# Patient Record
Sex: Male | Born: 1960 | Race: White | Hispanic: No | Marital: Married | Smoking: Never smoker
Health system: Southern US, Community
[De-identification: ages and names within clinical notes are randomized; demographics above are authoritative.]

## PROBLEM LIST (undated history)

## (undated) DIAGNOSIS — F32A Depression, unspecified: Secondary | ICD-10-CM

## (undated) DIAGNOSIS — F419 Anxiety disorder, unspecified: Secondary | ICD-10-CM

## (undated) DIAGNOSIS — E119 Type 2 diabetes mellitus without complications: Secondary | ICD-10-CM

## (undated) DIAGNOSIS — I10 Essential (primary) hypertension: Secondary | ICD-10-CM

## (undated) DIAGNOSIS — F259 Schizoaffective disorder, unspecified: Secondary | ICD-10-CM

## (undated) DIAGNOSIS — M1990 Unspecified osteoarthritis, unspecified site: Secondary | ICD-10-CM

## (undated) DIAGNOSIS — I639 Cerebral infarction, unspecified: Secondary | ICD-10-CM

## (undated) DIAGNOSIS — F329 Major depressive disorder, single episode, unspecified: Secondary | ICD-10-CM

## (undated) DIAGNOSIS — Z8669 Personal history of other diseases of the nervous system and sense organs: Secondary | ICD-10-CM

## (undated) DIAGNOSIS — E7800 Pure hypercholesterolemia, unspecified: Secondary | ICD-10-CM

## (undated) DIAGNOSIS — K621 Rectal polyp: Secondary | ICD-10-CM

## (undated) HISTORY — PX: VASECTOMY: SHX75

## (undated) HISTORY — DX: Depression, unspecified: F32.A

## (undated) HISTORY — DX: Cerebral infarction, unspecified: I63.9

## (undated) HISTORY — DX: Pure hypercholesterolemia, unspecified: E78.00

## (undated) HISTORY — DX: Unspecified osteoarthritis, unspecified site: M19.90

## (undated) HISTORY — DX: Essential (primary) hypertension: I10

## (undated) HISTORY — DX: Major depressive disorder, single episode, unspecified: F32.9

## (undated) HISTORY — DX: Schizoaffective disorder, unspecified: F25.9

## (undated) HISTORY — DX: Rectal polyp: K62.1

## (undated) HISTORY — DX: Personal history of other diseases of the nervous system and sense organs: Z86.69

## (undated) HISTORY — DX: Type 2 diabetes mellitus without complications: E11.9

## (undated) HISTORY — DX: Anxiety disorder, unspecified: F41.9

## (undated) HISTORY — PX: OTHER SURGICAL HISTORY: SHX169

## (undated) HISTORY — PX: TONSILLECTOMY: SHX5217

---

## 2004-12-05 ENCOUNTER — Ambulatory Visit (HOSPITAL_COMMUNITY): Admission: RE | Admit: 2004-12-05 | Discharge: 2004-12-05 | Payer: Self-pay | Admitting: Gastroenterology

## 2007-01-12 ENCOUNTER — Emergency Department (HOSPITAL_COMMUNITY): Admission: EM | Admit: 2007-01-12 | Discharge: 2007-01-12 | Payer: Self-pay | Admitting: Emergency Medicine

## 2009-10-22 ENCOUNTER — Ambulatory Visit: Payer: Self-pay | Admitting: Internal Medicine

## 2009-10-22 DIAGNOSIS — R29898 Other symptoms and signs involving the musculoskeletal system: Secondary | ICD-10-CM | POA: Insufficient documentation

## 2009-10-22 DIAGNOSIS — F329 Major depressive disorder, single episode, unspecified: Secondary | ICD-10-CM

## 2009-10-22 DIAGNOSIS — M25559 Pain in unspecified hip: Secondary | ICD-10-CM | POA: Insufficient documentation

## 2009-10-22 DIAGNOSIS — M79671 Pain in right foot: Secondary | ICD-10-CM

## 2009-10-22 DIAGNOSIS — J309 Allergic rhinitis, unspecified: Secondary | ICD-10-CM | POA: Insufficient documentation

## 2009-10-22 DIAGNOSIS — G43909 Migraine, unspecified, not intractable, without status migrainosus: Secondary | ICD-10-CM | POA: Insufficient documentation

## 2009-10-22 DIAGNOSIS — I10 Essential (primary) hypertension: Secondary | ICD-10-CM

## 2009-10-23 LAB — CONVERTED CEMR LAB
AST: 39 units/L — ABNORMAL HIGH (ref 0–37)
Albumin: 4.5 g/dL (ref 3.5–5.2)
Alkaline Phosphatase: 56 units/L (ref 39–117)
BUN: 16 mg/dL (ref 6–23)
Creatinine, Ser: 1 mg/dL (ref 0.4–1.5)
Eosinophils Relative: 7.5 % — ABNORMAL HIGH (ref 0.0–5.0)
Folate: 14.4 ng/mL
HCT: 47.1 % (ref 39.0–52.0)
Hemoglobin: 16.8 g/dL (ref 13.0–17.0)
Lymphocytes Relative: 29.2 % (ref 12.0–46.0)
Lymphs Abs: 1.9 10*3/uL (ref 0.7–4.0)
Platelets: 186 10*3/uL (ref 150.0–400.0)
RBC: 5.04 M/uL (ref 4.22–5.81)
TSH: 2.42 microintl units/mL (ref 0.35–5.50)
Vitamin B-12: 550 pg/mL (ref 211–911)

## 2009-10-24 ENCOUNTER — Encounter: Payer: Self-pay | Admitting: Internal Medicine

## 2009-12-03 ENCOUNTER — Ambulatory Visit: Payer: Self-pay | Admitting: Internal Medicine

## 2011-01-07 ENCOUNTER — Telehealth: Payer: Self-pay | Admitting: Internal Medicine

## 2011-01-22 NOTE — Progress Notes (Signed)
Summary: Lab req  Phone Note Call from Patient Call back at Home Phone [phone]   Caller: Spouse [phone] Summary of Call: Pt's spouse called requesting to have testosterone added to CPX labs, okay to add? Dx code V48.49 okay? Initial call taken by: Margaret Pyle, CMA,  January 07, 2011 9:51 AM  Follow-up for Phone Call        ok Follow-up by: Newt Lukes MD,  January 07, 2011 12:12 PM  Additional Follow-up for Phone Call Additional follow up Details #1::        Can you please add test to pt's CPX labs? Thx Additional Follow-up by: Margaret Pyle, CMA,  January 07, 2011 2:04 PM    Additional Follow-up for Phone Call Additional follow up Details #2::    THIS IS ADDED.  IS THE PATIENT AWARE? Follow-up by: Hilarie Fredrickson,  January 08, 2011 8:40 AM  Additional Follow-up for Phone Call Additional follow up Details #3:: Details for Additional Follow-up Action Taken: LMOM (CELL) THAT LAB WAS ADDED. Additional Follow-up by: Hilarie Fredrickson,  January 08, 2011 3:28 PM

## 2011-02-02 ENCOUNTER — Other Ambulatory Visit: Payer: Self-pay

## 2011-02-04 ENCOUNTER — Ambulatory Visit (INDEPENDENT_AMBULATORY_CARE_PROVIDER_SITE_OTHER): Payer: 59 | Admitting: Family Medicine

## 2011-02-04 DIAGNOSIS — F3289 Other specified depressive episodes: Secondary | ICD-10-CM

## 2011-02-04 DIAGNOSIS — E7800 Pure hypercholesterolemia, unspecified: Secondary | ICD-10-CM

## 2011-02-04 DIAGNOSIS — R5381 Other malaise: Secondary | ICD-10-CM

## 2011-02-04 DIAGNOSIS — Z0000 Encounter for general adult medical examination without abnormal findings: Secondary | ICD-10-CM

## 2011-02-04 DIAGNOSIS — F329 Major depressive disorder, single episode, unspecified: Secondary | ICD-10-CM

## 2011-02-09 ENCOUNTER — Encounter: Payer: Self-pay | Admitting: Internal Medicine

## 2011-02-09 ENCOUNTER — Ambulatory Visit: Payer: 59

## 2011-02-10 ENCOUNTER — Ambulatory Visit (INDEPENDENT_AMBULATORY_CARE_PROVIDER_SITE_OTHER): Payer: 59

## 2011-02-10 DIAGNOSIS — Z79899 Other long term (current) drug therapy: Secondary | ICD-10-CM

## 2011-02-11 ENCOUNTER — Ambulatory Visit (INDEPENDENT_AMBULATORY_CARE_PROVIDER_SITE_OTHER): Payer: 59 | Admitting: Family Medicine

## 2011-02-11 ENCOUNTER — Ambulatory Visit: Payer: 59

## 2011-02-11 DIAGNOSIS — R945 Abnormal results of liver function studies: Secondary | ICD-10-CM

## 2011-02-11 DIAGNOSIS — R7301 Impaired fasting glucose: Secondary | ICD-10-CM

## 2011-02-11 DIAGNOSIS — K5289 Other specified noninfective gastroenteritis and colitis: Secondary | ICD-10-CM

## 2011-03-05 ENCOUNTER — Ambulatory Visit: Payer: 59 | Admitting: Family Medicine

## 2011-03-06 ENCOUNTER — Ambulatory Visit (INDEPENDENT_AMBULATORY_CARE_PROVIDER_SITE_OTHER): Payer: 59 | Admitting: Family Medicine

## 2011-03-06 DIAGNOSIS — K5900 Constipation, unspecified: Secondary | ICD-10-CM

## 2011-03-06 DIAGNOSIS — R748 Abnormal levels of other serum enzymes: Secondary | ICD-10-CM

## 2011-03-06 DIAGNOSIS — E7800 Pure hypercholesterolemia, unspecified: Secondary | ICD-10-CM

## 2011-03-06 DIAGNOSIS — F329 Major depressive disorder, single episode, unspecified: Secondary | ICD-10-CM

## 2011-05-08 NOTE — Op Note (Signed)
NAMECONALL, VANGORDER              ACCOUNT NO.:  [id number]   MEDICAL RECORD NO.:  [id number]          PATIENT TYPE:  AMB   LOCATION:  ENDO                         FACILITY:  [facility]   PHYSICIAN:  Graylin Shiver, M.D.   DATE OF BIRTH:  09-Jul-1961   DATE OF PROCEDURE:  12/05/2004  DATE OF DISCHARGE:                                 OPERATIVE REPORT   PROCEDURE:  Colonoscopy.   INDICATIONS:  Rectal bleeding.   Informed consent was obtained after explanation of the risks of bleeding,  infection, and perforation.   PREMEDICATIONS:  Fentanyl 75 mcg IV, Versed 8 mg IV.   PROCEDURE:  With the patient in the left lateral decubitus position, a  rectal exam was performed.  No masses were felt.  The Olympus colonoscope  was inserted into the rectum and advanced around the colon to the cecum.  Cecal landmarks were identified.  The cecum and ascending colon were normal.  The transverse colon normal.  The descending colon, sigmoid, and rectum were  normal.  There were some small internal and external hemorrhoids noted.  He  tolerated the procedure well without complications.   IMPRESSION:  Normal colonoscopy to the cecum with some small internal and  external hemorrhoids.      SFG/MEDQ  D:  12/05/2004  T:  12/05/2004  Job:  [id number]   cc:   Annabell Howells, MD  Fax: [phone]

## 2011-06-05 ENCOUNTER — Other Ambulatory Visit: Payer: Self-pay | Admitting: Family Medicine

## 2011-06-08 ENCOUNTER — Telehealth: Payer: Self-pay | Admitting: *Deleted

## 2011-06-08 ENCOUNTER — Telehealth: Payer: Self-pay

## 2011-06-08 MED ORDER — BUPROPION HCL ER (XL) 150 MG PO TB24: 300.0000 mg | ORAL_TABLET | Freq: Every day | ORAL | Status: DC

## 2011-06-08 NOTE — Telephone Encounter (Signed)
I had already denied the med refill request, since the dose was incorrect. There likely shouldn't be any difference, but if he prefers to go back to the Wellbutrin XL 150mg  taking 2 tabs daily, then okay to refill #60 with 2 additional refills.  If he still feels that it isn't working adequately, then schedule OV.  (Phone encounter was already closed)

## 2011-06-08 NOTE — Telephone Encounter (Signed)
Spoke with Lawson Fiscal, pt's wife and notified her that Dr.Knapp said it was ok to change rx to bupropion hcl xl 150mg  #60 2qd with 2rf's to [place] [phone]) and if they find that taking the 2 pills instead of the one for 300mg  is no longer working to schedule an OV.

## 2011-06-08 NOTE — Telephone Encounter (Signed)
This was in as an rx request, not sure if you got it too, so I am sending it to you. Thx.

## 2011-06-08 NOTE — Telephone Encounter (Signed)
Pt wife called said husband feels like he dosent get the same affect from the 300 mg tab of Wellbutrin as he got from the 150 mg that he took 2 tabs at same time ask what could they do [employer] [phone] said she thinks its psychological but not sure

## 2011-06-08 NOTE — Telephone Encounter (Signed)
Forwarding this message to you, I believe there was an rx request for this medication on this patient this am. Thx.

## 2011-09-07 ENCOUNTER — Other Ambulatory Visit: Payer: 59

## 2011-09-07 ENCOUNTER — Encounter: Payer: Self-pay | Admitting: Family Medicine

## 2011-09-07 DIAGNOSIS — R7309 Other abnormal glucose: Secondary | ICD-10-CM

## 2011-09-07 DIAGNOSIS — E7800 Pure hypercholesterolemia, unspecified: Secondary | ICD-10-CM

## 2011-09-07 LAB — HEPATIC FUNCTION PANEL
ALT: 30 U/L (ref 0–53)
AST: 26 U/L (ref 0–37)
Albumin: 4.7 g/dL (ref 3.5–5.2)
Bilirubin, Direct: 0.1 mg/dL (ref 0.0–0.3)
Indirect Bilirubin: 0.4 mg/dL (ref 0.0–0.9)
Total Bilirubin: 0.5 mg/dL (ref 0.3–1.2)
Total Protein: 7.2 g/dL (ref 6.0–8.3)

## 2011-09-07 LAB — LIPID PANEL
Cholesterol: 204 mg/dL — ABNORMAL HIGH (ref 0–200)
HDL: 47 mg/dL (ref >39)
LDL Cholesterol: 133 mg/dL — ABNORMAL HIGH (ref 0–99)
Total CHOL/HDL Ratio: 4.3 Ratio
Triglycerides: 119 mg/dL (ref <150)

## 2011-09-07 LAB — HEMOGLOBIN A1C: Mean Plasma Glucose: 120 mg/dL — ABNORMAL HIGH (ref <117)

## 2011-09-10 ENCOUNTER — Ambulatory Visit (INDEPENDENT_AMBULATORY_CARE_PROVIDER_SITE_OTHER): Payer: 59 | Admitting: Family Medicine

## 2011-09-10 ENCOUNTER — Encounter: Payer: Self-pay | Admitting: Family Medicine

## 2011-09-10 VITALS — BP 150/100 | HR 72 | Ht 67.0 in | Wt 191.0 lb

## 2011-09-10 DIAGNOSIS — R030 Elevated blood-pressure reading, without diagnosis of hypertension: Secondary | ICD-10-CM

## 2011-09-10 DIAGNOSIS — F329 Major depressive disorder, single episode, unspecified: Secondary | ICD-10-CM

## 2011-09-10 DIAGNOSIS — E7800 Pure hypercholesterolemia, unspecified: Secondary | ICD-10-CM

## 2011-09-10 MED ORDER — BUPROPION HCL ER (XL) 300 MG PO TB24: 300.0000 mg | ORAL_TABLET | Freq: Every day | ORAL | Status: DC

## 2011-09-10 NOTE — Progress Notes (Signed)
Patient presents for 6 month follow-up.  He had fasting labs earlier this week.  Depression:  Moods are up and down, doesn't seem to be doing quite as well as he was when medications were originally started.  He thought the 300mg  tablet didn't work as well as taking 2 150mg  tablets.  That seemed to work better for about 2 months, but now is back to not working as well.  Job stress seems to contribute to moods. Working a lot of hours, hours recently changed.  Recent "blunder" with his boss; he feels it was related to insecurity related to issues growing up. Never ended up going for counseling (had to get rescheduled, and never went). Ongoing memory issues, possibly contributed to lack of sleep (at work by 4:30 am; going to bed by 9am, and having restless sleep).  Takes 2 Benadryl at night to help with sleep--helps some, but dries him out too much.  Recalls trying Melatonin, but doesn't remember how helpful it was.   Hasn't checked BP elsewhere since last visit.  Having headaches about every 3-4 days, described as migraines (but not full blown)--last one was when he had his fasting labs, didn't have his caffeine, and had a headache.  Gets a weird kind of a pain in his chest, described as a "stutter in his heart", heart felt like it was flip-flopping.  Also gets various shooting pains in chest, in different places every times, and sometimes in arms.  Not related to exercise, strenuous activity.  Hyperlipidemia--just had 6 month re-check, after a dietary trial.  Lab results from last week:  A1c 5.8; LFTs were normal (h/o elevated liver tests 6 months ago) Total chol 204; Triglycerides 119; HDL 47; Total CHOL/HDL Ratio 4.3; LDL Cholesterol 133   Past Medical History  Diagnosis Date  . Depression   . Elevated cholesterol   . History of migraine headaches     Past Surgical History  Procedure Date  . Vasectomy   . Tonsilectomy, adenoidectomy, bilateral myringotomy and tubes     History   Social  History  . Marital Status: Married    Spouse Name: N/A    Number of Children: N/A  . Years of Education: N/A   Occupational History  . Designer, industrial/product    Social History Main Topics  . Smoking status: Never Smoker   . Smokeless tobacco: Not on file  . Alcohol Use: Yes     1 beer or wine 2-3 times per month.  . Drug Use: No  . Sexually Active: Not on file   Other Topics Concern  . Not on file   Social History Narrative  . No narrative on file    Family History  Problem Relation Age of Onset  . Cancer Paternal Aunt   . Hypertension Maternal Grandfather   . COPD Maternal Grandfather   . Stroke Maternal Grandfather     Current outpatient prescriptions:buPROPion (WELLBUTRIN XL) 300 MG 24 hr tablet, Take 1 tablet (300 mg total) by mouth daily., Disp: 30 tablet, Rfl: 2;  cholecalciferol (VITAMIN D) 1000 UNITS tablet, Take 1,000 Units by mouth daily.  , Disp: , Rfl: ;  diphenhydrAMINE (BENADRYL) 25 mg capsule, Take 25 mg by mouth every 6 (six) hours as needed.  , Disp: , Rfl: ;  Lutein 10 MG TABS, Take 1 tablet by mouth daily.  , Disp: , Rfl:  Omega-3 Fatty Acids (FISH OIL) 1200 MG CAPS, Take 1 capsule by mouth daily.  , Disp: , Rfl:  No Known Allergies  ROS: Denies fevers, cough, SOB.  Some ongoing congestion, slight PND/sore throat.  Denies GI complaints. +insomnia, depression, allergies. See HPI  PHYSICAL EXAM: BP 150/100  Pulse 72  Ht 5\' 7"  (1.702 m)  Wt 191 lb (86.637 kg)  BMI 29.91 kg/m2 Pleasant male, no distress HEENT: PERRL, conjunctiva clear, OP normal Neck: no lymphadenopathy or thyromegaly Heart: regular rate and rhythm without murmur Lungs: clear bilaterally Abdomen: soft, nontender, no organomegaly or mass Extremities: no clubbing, cyanosis or edema Psych: Became tearful in discussing his moods, needed to take a few moments to compose himself.  Full range of affect.  Normal speech, fair eye contact Skin: no rash Neuro: alert and oriented x 3; normal gait,  cranial nerves grossly intact  ASSESSMENT/PLAN: 1. Depressive disorder, not elsewhere classified  buPROPion (WELLBUTRIN XL) 300 MG 24 hr tablet  2. Elevated BP    3. Pure hypercholesterolemia     Depression--Counseling strongly encouraged.  Given Raynelle Fanning Whitt's card.  Discussed regular exercise and stress reduction techniques.  Will continue current meds for now (pt declines starting new med)--consider changing at f/u (discussed increasing wellbutrin to 450 mg vs adding SSRI) Insomnia--Recommended re-tryingMelatonin; daily exercise will help with sleep. Stop benadryl at bedtime--restart at just 1 tablet if melatonin ineffective alone.  Decreasing benadryl may help with the dryness High Cholesterol--has improved with dietary measures--still borderline (LDL goal <130, ratio goal <4)  Cut back on cheese intake; daily exercise and continue fish oil.  Recheck 6 months Elevated BP--low sodium diet reviewed; daily exercise, weight loss.  He has a BP monitor--to start checking at home.  Bring BP list and monitor to f/u in 1 month

## 2011-09-10 NOTE — Patient Instructions (Addendum)
Daily exercise of at least 30 minutes is recommended. Low sodium diet--see below. Call for counseling Curtis Lam or whomever takes your insurance) Check blood pressure a few times/week (every day is okay if you prefer) Bring your list of blood pressures and your blood pressure monitor to your visit next month  2 Gram Low Sodium Diet A 2 gram sodium diet restricts the amount of sodium in the diet to no more than 2 grams (g) or 2000 milligrams (mg) daily. Limiting the amount of sodium is often used to help lower blood pressure. It is important if you have heart, liver, or kidney problems. Many foods contain sodium for flavor and sometimes as a preservative. When the amount of sodium in a diet needs to be low, it is important to know what to look for when choosing foods and drinks. The following includes some information and guidelines to help make it easier for you to adapt to a low sodium diet. QUICK TIPS  Do not add salt to food.   Avoid convenience items and fast food.   Choose unsalted snack foods.   Buy lower sodium products, often labeled as "lower sodium" or "no salt added."   Check food labels to learn how much sodium is in 1 serving.   When eating at a restaurant, ask that your food be prepared with less salt or none, if possible.  READING FOOD LABELS FOR SODIUM INFORMATION The nutrition facts label is a good place to find how much sodium is in foods. Look for products with no more than 500 to 600 mg of sodium per meal and no more than 150 mg per serving. Remember that 2 g = 2000 mg. The food label may also list foods as:  Sodium-free: Less than 5 mg in a serving.   Very low sodium: 35 mg or less in a serving.   Low-sodium: 140 mg or less in a serving.   Light in sodium: 50% less sodium in a serving. For example, if a food that usually has 300 mg of sodium is changed to become light in sodium, it will have 150 mg of sodium.   Reduced sodium: 25% less sodium in a serving.  For example, if a food that usually has 400 mg of sodium is changed to reduced sodium, it will have 300 mg of sodium.  CHOOSING FOODS AVOID CHOOSE  Grains: Salted crackers and snack items. Some cereals, including instant hot cereals. Bread stuffing and biscuit mixes. Seasoned rice or pasta mixes. Grains: Unsalted snack items. Low-sodium cereals, oats, puffed wheat and rice, shredded wheat. English muffins and bread. Pasta.  Meats: Salted, canned, smoked, spiced, or pickled meats, including fish and poultry. Bacon, ham, sausage, cold cuts, hot dogs, anchovies. Meats: Low-sodium canned tuna and salmon. Fresh or frozen meat, poultry, and fish.  Dairy: Processed cheese and spreads. Cottage cheese. Buttermilk and condensed milk. Regular cheese. Dairy: Milk. Low-sodium cottage cheese. Yogurt. Sour cream. Low-sodium cheese.  Fruits and Vegetables: Regular canned vegetables. Regular canned tomato sauce and paste. Frozen vegetables in sauces. Olives. Rosita Fire. Relishes. Sauerkraut. Fruits and Vegetables: Low-sodium canned vegetables. Low-sodium tomato sauce and paste. Fresh and frozen vegetables. Fresh and frozen fruit.  Condiments: Canned and packaged gravies. Worcestershire sauce. Tartar sauce. Barbecue sauce. Soy sauce. Steak sauce. Ketchup. Onion, garlic, and table salt. Meat flavorings and tenderizers. Condiments: Fresh and dried herbs and spices. Low-sodium varieties of mustard and ketchup. Lemon juice. Tabasco sauce. Horseradish.  SAMPLE 2 GRAM SODIUM MEAL PLAN Meal Foods Sodium (mg)  Breakfast 1 cup low-fat milk 2 slices whole-wheat toast 1 tablespoon heart-healthy margarine 1 hard-boiled egg 1 small orange [phone]  Lunch 1 cup raw carrots  cup hummus 1 cup low-fat milk  cup red grapes 1 whole-wheat pita bread [phone]  Dinner 1 cup whole-wheat pasta 1 cup low-sodium tomato sauce 3 ounces lean ground beef 1 small side  salad (1 cup raw spinach leaves,  cup cucumber,  cup yellow bell pepper) with 1 teaspoon olive oil and 1 teaspoon red wine vinegar 2 73 [phone] 25  Snack 1 container low-fat vanilla yogurt 3 graham cracker squares 107 127  Nutrient Analysis Calories: 2033 Protein: 77 g Carbohydrate: 282 g Fat: 72 g Sodium: 1971 mg Document Released: 12/07/2005 Document Re-Released: 05/27/2010 [facility] Patient Information 2011 [place], [place].

## 2011-09-17 ENCOUNTER — Telehealth: Payer: Self-pay | Admitting: Family Medicine

## 2011-10-07 NOTE — Telephone Encounter (Signed)
dt ?

## 2011-10-12 ENCOUNTER — Ambulatory Visit (INDEPENDENT_AMBULATORY_CARE_PROVIDER_SITE_OTHER): Payer: 59 | Admitting: Family Medicine

## 2011-10-12 ENCOUNTER — Encounter: Payer: Self-pay | Admitting: Family Medicine

## 2011-10-12 VITALS — BP 142/96 | HR 76 | Ht 67.0 in | Wt 188.0 lb

## 2011-10-12 DIAGNOSIS — R143 Flatulence: Secondary | ICD-10-CM

## 2011-10-12 DIAGNOSIS — R030 Elevated blood-pressure reading, without diagnosis of hypertension: Secondary | ICD-10-CM

## 2011-10-12 DIAGNOSIS — R140 Abdominal distension (gaseous): Secondary | ICD-10-CM

## 2011-10-12 DIAGNOSIS — F329 Major depressive disorder, single episode, unspecified: Secondary | ICD-10-CM

## 2011-10-12 NOTE — Patient Instructions (Addendum)
Start exercising, weight loss recommended.  Continue low sodium diet Consider checking testosterone levels if fatigue persists even after exercising Gas--trial of lactose-free diet.  Consider trial of Beano before vegetables that produce gas. Call Berniece Andreas (or other counselor if she doesn't take your insurance) to start counseling.  Continue to monitor blood pressures.  Lactose-Free Diet Lactose is a carbohydrate that is found mainly in milk and milk products, as well as in foods with added milk or whey. Lactose must be digested by the enzyme lactase in order to be used by the body. Lactose intolerance occurs when there is a shortage of lactase. When your body is not able to digest lactose, you may feel sick to your stomach (nausea), bloated, and have cramps, gas, and diarrhea. TYPES OF LACTASE DEFICIENCY  Primary lactase deficiency. This is the most common type. It is characterized by a slow decrease in lactase activity.   Secondary lactase deficiency. This occurs following injury to the small intestinal mucosa as a result of a disease or condition. It can also occur as a result of surgery or after treatment with antibiotic medicines or cancer drugs.  Tolerance to lactose varies widely. Each person must determine how much milk can be consumed without developing symptoms. Drinking smaller portions of milk throughout the day may be helpful. Some studies suggest that slowing gastric emptying may help increase tolerance of milk products. This may be done by:  Consuming milk or milk products with a meal rather than alone.   Consuming milk with a higher fat content.  There are many dairy products that may be tolerated better than milk by some people, including:  Cheese (especially aged cheese). The lactose content is much lower than in milk.   Cultured dairy products, such as yogurt, buttermilk, cottage cheese, and sweet acidophilus milk (kefir). These products are usually well tolerated by  lactase-deficient people. This is because the healthy bacteria help digest lactose.   Lactose-hydrolyzed milk. This product contains 40% to 90% less lactose than milk and may also be well tolerated.  ADEQUACY These diets may be deficient in calcium, riboflavin, and vitamin D, according to the Recommended Dietary Allowances of the [employer]. Depending on individual tolerances and the use of milk substitutes, milk, or other dairy products, you may be able to meet these recommendations. SPECIAL NOTES  Lactose is a carbohydrate. The main food source for lactose is dairy products. Reading food labels is important. Many products contain lactose even when they are not made from milk. Look for the following words: whey, milk solids, dry milk solids, nonfat dry milk powder. Typical sources of lactose other than dairy products include breads, candies, cold cuts, prepared and processed foods, and commercial sauces and gravies.   All foods must be prepared without milk, cream, or other dairy foods.   A vitamin or mineral supplement may be necessary. Consult your caregiver or Registered Dietitian.   Lactose is also found in many prescription and over-the-counter medicines.   Soy milk and lactose-free supplements may be used as an alternative to milk.  CHOOSING FOODS Breads and Starches  Allowed: Breads and rolls made without milk. [place], [place], or [place] bread. Soda crackers, graham crackers. Any crackers prepared without lactose. Cooked or dry cereals prepared without lactose (read labels). Any potatoes, pasta, or rice prepared without milk or lactose. Popcorn.   Avoid: Breads and rolls that contain milk. Prepared mixes such as muffins, biscuits, waffles, pancakes. Sweet rolls, donuts, [place] toast (if made with milk or  lactose). Zwieback crackers, corn curls, or any crackers that contain lactose. Cooked or dry cereals prepared with lactose (read labels). Instant potatoes, frozen [place]  fries, scalloped or au gratin potatoes.  Vegetables  Allowed: Fresh, frozen, and canned vegetables.   Avoid: Creamed or breaded vegetables. Vegetables in a cheese sauce or with lactose-containing margarines.  Fruit  Allowed: All fresh, canned, or frozen fruits that are not processed with lactose.   Avoid: Any canned or frozen fruits processed with lactose.  Meat and Meat Substitutes  Allowed: Plain beef, chicken, fish, [place], lamb, veal, pork, or ham. Kosher prepared meat products. Strained or junior meats that do not contain milk. Eggs, soy meat substitutes, nuts.   Avoid: Scrambled eggs, omelets, and souffles that contain milk. Creamed or breaded meat, fish, or fowl. Sausage products such as wieners, liver sausage, or cold cuts that contain milk solids. Cheese, cottage cheese, or cheese spreads.  Milk  Allowed: None.   Avoid: Milk (whole, 2%, skim, or chocolate). Evaporated, powdered, or condensed milk. Malted milk.  Soups and Combination Foods  Allowed: Bouillon, broth, vegetable soups, clear soups, consomms. Homemade soups made with allowed ingredients. Combination or prepared foods that do not contain milk or milk products (read labels).   Avoid: Cream soups, chowders, commercially prepared soups containing lactose. Macaroni and cheese, pizza. Combination or prepared foods that contain milk or milk products.  Desserts and Sweets  Allowed: Water and fruit ices, gelatin, angel food cake. Homemade cookies, pies, or cakes made from allowed ingredients. Pudding (if made with water or a milk substitute). Lactose-free tofu desserts. Sugar, honey, corn syrup, jam, jelly, marmalade, molasses (beet sugar). Pure sugar candy, marshmallows.   Avoid: Ice cream, ice milk, sherbet, custard, pudding, frozen yogurt. Commercial cake and cookie mixes. Desserts that contain chocolate. Pie crust made with milk-containing margarine. Reduced calorie desserts made with a sugar substitute that contains  lactose. Toffee, peppermint, butterscotch, chocolate, caramels.  Fats and Oils  Allowed: Butter (as tolerated, contains very small amounts of lactose). Margarines and dressings that do not contain milk. Vegetable oils, shortening, mayonnaise, nondairy cream and whipped toppings without lactose or milk solids added. Tomasa Blase.   Avoid: Margarines and salad dressings containing milk. Cream, cream cheese, peanut butter with added milk solids, sour cream, chip dips made with sour cream.  Beverages  Allowed: Carbonated drinks, tea, coffee and freeze-dried coffee, some instant coffees (check labels). Fruit drinks, fruit and vegetable juice, rice or soy milk.   Avoid: Hot chocolate. Some cocoas, some instant coffees, instant iced teas, powdered fruit drinks (read labels).  Condiments  Allowed: Soy sauce, carob powder, olives, gravy made with water, baker's cocoa, pickles, pure seasonings and spices, wine, pure monosodium glutamate, catsup, mustard.   Avoid: Some chewing gums, chocolate, some cocoas. Certain antibiotics and vitamin or mineral preparations. Spice blends if they contain milk products. MSG extender. Artificial sweeteners that contain lactose. Some nondairy creamers (read labels).  SAMPLE MENU Breakfast  Orange juice.   Banana.   Bran cereal.   Nondairy creamer.   Vienna bread, toasted.   Butter or milk-free margarine.   Coffee or tea.  Lunch  Chicken breast.   Rice.   Green beans.   Butter or milk-free margarine.   Fresh melon.   Coffee or tea.  Dinner  [place].   Baked potato.   Butter or milk-free margarine.   Broccoli.   Lettuce salad with vinegar and oil dressing.   [employer].   Coffee or tea.  Document Released:  05/29/2002 Document Revised: 08/19/2011 Document Reviewed: 03/06/2011 [facility] Patient Information 2012 [place], [place].

## 2011-10-12 NOTE — Progress Notes (Signed)
Patient presents for follow up on blood pressure, depression and anxiety.  He lost Raynelle Fanning [facility] card, and was too busy to call for counseling. Overall he feels like the medications are working okay, and not interested in changing.  Will consider counseling, feels like it would be good for him to go and get "some things resolved".  Patient has been trying to cut back on the sodium in his diet.  Has been monitoring BP regularly at home.  Ranging from 116-148/79-93, frequently 120-'s130, occasionally up to mid-140's.  Hasn't started exercising yet (wanted to see what diet alone did first, before starting exercise).  Has lost 3 pounds since last visit (probably more--wearing steel-toed shoes today).  Work hours have improved.  Sleep is improved, mood swings have stabilized.  Still complaining of lack of energy.  Admits that he is afraid to exercise, for fear of the pain and soreness he will feel.  Complaining of excessive gas.  Occurs after eating, and gas is malodorous.  Denies any changes in diet (other than reducing sodium in diet).  Worse when eating meats, beans.  Eats a lot of cheese.  Beano hasn't helped very much.  Only rare gas pains.  Past Medical History  Diagnosis Date  . Depression   . Elevated cholesterol   . History of migraine headaches     Past Surgical History  Procedure Date  . Vasectomy   . Tonsilectomy, adenoidectomy, bilateral myringotomy and tubes     History   Social History  . Marital Status: Married    Spouse Name: N/A    Number of Children: N/A  . Years of Education: N/A   Occupational History  . Designer, industrial/product    Social History Main Topics  . Smoking status: Never Smoker   . Smokeless tobacco: Not on file  . Alcohol Use: Yes     1 beer or wine 2-3 times per month.  . Drug Use: No  . Sexually Active: Not on file   Other Topics Concern  . Not on file   Social History Narrative  . No narrative on file    Family History  Problem Relation Age of  Onset  . Cancer Paternal Aunt   . Hypertension Maternal Grandfather   . COPD Maternal Grandfather   . Stroke Maternal Grandfather    Current Outpatient Prescriptions on File Prior to Visit  Medication Sig Dispense Refill  . buPROPion (WELLBUTRIN XL) 300 MG 24 hr tablet Take 1 tablet (300 mg total) by mouth daily.  30 tablet  2  . cholecalciferol (VITAMIN D) 1000 UNITS tablet Take 2,000 Units by mouth daily.       . diphenhydrAMINE (BENADRYL) 25 mg capsule Take 50 mg by mouth at bedtime as needed.       . Omega-3 Fatty Acids (FISH OIL) 1200 MG CAPS Take 1 capsule by mouth daily.          No Known Allergies  ROS:  Denies headache currently (on his BP list, occasionally notes some headaches on weekends; same amt of caffeine on weekends).  Denies fevers, URI symptoms, cough, SOB, chest pain, dizziness, neuro symptoms.  See HPI for GI complaints.  Denies bowel changes or nausea  PHYSICAL EXAM: BP 142/96  Pulse 76  Ht 5\' 7"  (1.702 m)  Wt 188 lb (85.276 kg)  BMI 29.44 kg/m2 Well developed, pleasant male in no distress Neck: no lymphadenopathy or thyromegaly Heart: regular rate and rhythm without murmur Lungs: clear bilaterally Abdomen: soft, nontender, normal  bowel sounds, no masses Extremities: no edema Psych: normal mood, affect, hygiene and grooming.  Normal speech and eye contact.  Full range of affect Skin: no rash  ASSESSMENT/PLAN:  1. Elevated BP   2. DEPRESSION   3. Gassiness    Start exercising, weight loss recommended.  Continue low sodium diet Consider checking testosterone levels if fatigue persists even after exercising Gas--trial of lactose-free diet.  Consider trial of beano before vegetables that produce gas  F/u 3-4 months (on HTN, moods, gassiness), sooner prn Will need lipids checking again in 4-6 month

## 2011-12-23 ENCOUNTER — Telehealth: Payer: Self-pay | Admitting: Internal Medicine

## 2011-12-23 DIAGNOSIS — F329 Major depressive disorder, single episode, unspecified: Secondary | ICD-10-CM

## 2011-12-23 MED ORDER — BUPROPION HCL ER (XL) 300 MG PO TB24: 300.0000 mg | ORAL_TABLET | Freq: Every day | ORAL | Status: DC

## 2011-12-23 NOTE — Telephone Encounter (Signed)
Wellbutrin called in at patient request

## 2012-01-13 ENCOUNTER — Encounter: Payer: Self-pay | Admitting: Family Medicine

## 2012-01-13 ENCOUNTER — Ambulatory Visit (INDEPENDENT_AMBULATORY_CARE_PROVIDER_SITE_OTHER): Payer: 59 | Admitting: Family Medicine

## 2012-01-13 VITALS — BP 142/96 | HR 72 | Temp 98.5°F | Ht 67.0 in | Wt 187.0 lb

## 2012-01-13 DIAGNOSIS — E785 Hyperlipidemia, unspecified: Secondary | ICD-10-CM

## 2012-01-13 DIAGNOSIS — J069 Acute upper respiratory infection, unspecified: Secondary | ICD-10-CM

## 2012-01-13 DIAGNOSIS — R7301 Impaired fasting glucose: Secondary | ICD-10-CM

## 2012-01-13 DIAGNOSIS — F329 Major depressive disorder, single episode, unspecified: Secondary | ICD-10-CM

## 2012-01-13 DIAGNOSIS — R030 Elevated blood-pressure reading, without diagnosis of hypertension: Secondary | ICD-10-CM

## 2012-01-13 DIAGNOSIS — F3289 Other specified depressive episodes: Secondary | ICD-10-CM

## 2012-01-13 MED ORDER — BUPROPION HCL ER (XL) 300 MG PO TB24: 300.0000 mg | ORAL_TABLET | Freq: Every day | ORAL | Status: DC

## 2012-01-13 NOTE — Patient Instructions (Addendum)
Elevated BP--improved per home numbers.  Clearly has some white coat component to his blood pressure. Treatment not indicated at this point, but is recommended if BP remains greater than 135/85 at home. Continue to follow low sodium diet. Exercise at least 30 minutes daily, and continue to lose weight.  Headaches--musculoskeletal in origin.  Try on relaxation techniques, trying to avoid clenching.  Discuss further with dentist, if needed  Continue low cholesterol diet.  Re-check lipid panel in the next month (schedule fasting lab visit).  URI--supportive measures, Robitussin DM as needed.  Avoid decongestants because they can raise BP.  Try sinus rinses or Neti pot if sinus pain is worsening.  Call next week for antibiotics if symptoms persist or worsen  Low back pain/sciatica. Consider trial of chiropractic care, suggested Dr. Thereasa Distance at [facility] on [facility]  Sign release forms--I would like to know the date of your last colonoscopy, as well as all of your immunizations.   Return for office visit in 6 months, sooner if BP is >135/85 at home, or other concerns

## 2012-01-13 NOTE — Progress Notes (Signed)
Chief complaint:  1.  F/u HTN 2.  F/u anxiety/depression 3. Cough/URI symptoms  HPI:  Hypertension follow-up.  BP's are running low 130's/80-83 at home.  Never >135 systolic, rarely diastolic as high as 89, related to sodium intake (from peanuts).  Had chicken pot pie for lunch today, that was high in sodium.  Admits to not exercising enough, but moving around more in the yard.  Has a constant headache, which used to be posterior neck, which has resolved, but now has chronic headache on the R side of the head--starts in the frontal area, over the top of the head, and only rarely goes to the back of the head.  Feels like it is a sinus headache.  Headaches are daily, but often is only very mild, doesn't need treatment.  Headache seems to improve with rest alone frequently.  If more severe, he takes Advil with good results.  +known to clench his teeth. Had trouble tolerating a bite guard (caused gagging).  Denies chronic sinus problems/allergies, but sick now.  Depression and anxiety--much improved.  Realizes that he has issues related to his mother.  Chose not to go to counseling, because it is painful for him, and prefers to avoid those issues.  Complains of sinus congestion, sore throat and cough x 3-4 days.  Mucus from nose has been yellow, and some bleeding from L nostril.  Cough is mainly dry, related to postnasal drip.  Felt a little warm, had some sweats 2 nights ago.  Having some fatigue.  Has been taking robitussin DM with some improvement in cough, mainly using at night.  Past Medical History  Diagnosis Date  . Depression   . Elevated cholesterol   . History of migraine headaches     Past Surgical History  Procedure Date  . Vasectomy   . Tonsilectomy, adenoidectomy, bilateral myringotomy and tubes     History   Social History  . Marital Status: Married    Spouse Name: N/A    Number of Children: N/A  . Years of Education: N/A   Occupational History  . Designer, industrial/product     Social History Main Topics  . Smoking status: Never Smoker   . Smokeless tobacco: Not on file  . Alcohol Use: Yes     1 beer or wine 2-3 times per month.  . Drug Use: No  . Sexually Active: Not on file   Other Topics Concern  . Not on file   Social History Narrative  . No narrative on file    Family History  Problem Relation Age of Onset  . Cancer Paternal Aunt   . Hypertension Maternal Grandfather   . COPD Maternal Grandfather   . Stroke Maternal Grandfather    Current Outpatient Prescriptions on File Prior to Visit  Medication Sig Dispense Refill  . aspirin 81 MG tablet Take 81 mg by mouth daily.        . B Complex-C (SUPER B COMPLEX PO) Take 1 tablet by mouth daily.        . Calcium Citrate-Vitamin D 500-400 MG-UNIT CHEW Chew 1 each by mouth daily.        . cetirizine (ZYRTEC) 10 MG tablet Take 10 mg by mouth daily.        . cholecalciferol (VITAMIN D) 1000 UNITS tablet Take 2,000 Units by mouth daily.       . Chromium 500 MCG TABS Take 1 tablet by mouth daily.        . diphenhydrAMINE (BENADRYL)  25 mg capsule Take 50 mg by mouth at bedtime as needed.       . docusate sodium (COLACE) 100 MG capsule Take 100 mg by mouth daily.        . Lutein-Zeaxanthin 25-5 MG CAPS Take 1 tablet by mouth daily.        . Multiple Vitamins-Minerals (MULTIVITAMIN WITH MINERALS) tablet Take 1 tablet by mouth daily.        . Omega-3 Fatty Acids (FISH OIL) 1200 MG CAPS Take 1 capsule by mouth daily.        . vitamin E 400 UNIT capsule Take 400 Units by mouth daily.        Marland Kitchen DISCONTD: buPROPion (WELLBUTRIN XL) 300 MG 24 hr tablet Take 1 tablet (300 mg total) by mouth daily.  30 tablet  2   No Known Allergies  ROS:  See HPI.  Denies chest pain, palpitations, shortness of breath, skin rash, GI complaints or other concerns.  He reports some restless legs, needing to get up and walk around at night, especially if having R sided sciatica flare.  PHYSICAL EXAM: BP 142/96  Pulse 72  Temp(Src)  98.5 F (36.9 C) (Oral)  Ht 5\' 7"  (1.702 m)  Wt 187 lb (84.823 kg)  BMI 29.29 kg/m2 164/104 by me R arm on repeat Well developed, pleasant male, mildly anxious, otherwise no distress HEENT:  PERRL, EOMI, conjunctiva clear.  TM's and EAC's normal.  Nasal mucosa mildly edematous, clear mucus with some yellow crust on left. Sinuses nontender.  OP clear.  Temporalis muscle and temporal arteries nontender Neck: no lymphadenopathy, thyromegaly or carotid bruit Heart: regular rate and rhythm without murmur Lungs: clear bilaterally Skin: no rash Abdomen: soft, nontender Psych: normal mood, affect.  Got a little anxious in discussing his mother, how situations at work remind him of situations related to his mother  ASSESSMENT/PLAN:  1. DEPRESSION    2. Elevated BP    3. Impaired fasting glucose  Glucose, random  4. Depressive disorder, not elsewhere classified  buPROPion (WELLBUTRIN XL) 300 MG 24 hr tablet, DISCONTINUED: buPROPion (WELLBUTRIN XL) 300 MG 24 hr tablet  5. Dyslipidemia  Lipid panel  6. URI (upper respiratory infection)     Elevated BP--improved per home numbers.  Clearly has some white coat component to his blood pressure. Treatment not indicated at this point, as long as BP remains <135/85. Continue to follow low sodium  Diet. Exercise at least 30 minutes daily, and continue to lose weight.  Headaches--musculoskeletal, may be related to clenching teeth.  Try on relaxation techniques, trying to avoid clenching.  Discuss further with dentist, if needed.  Continue Advil prn.  Depression and anxiety--doing well over all.  Prefers to avoid counseling at this time.    Dyslipidemia (LDL 133, ratio >4)--increase exercise, trial to increase fish oil to 3000-4000 mg daily.  Continue low cholesterol diet.  Re-check lipid panel in the next month  URI--supportive measures, Robitussin DM as needed.  Avoid decongestants because they can raise BP.  Try sinus rinses or Neti pot if sinus pain is  worsening.  Call next week for antibiotics if symptoms persist or worsen  Low back pain/sciatica.  Intermittent.  Consider trial of chiropractic care, suggested Dr. Thereasa Distance at [facility] on [facility]

## 2012-02-15 ENCOUNTER — Other Ambulatory Visit: Payer: 59

## 2012-02-15 DIAGNOSIS — R7301 Impaired fasting glucose: Secondary | ICD-10-CM

## 2012-02-15 DIAGNOSIS — E785 Hyperlipidemia, unspecified: Secondary | ICD-10-CM

## 2012-02-15 DIAGNOSIS — E782 Mixed hyperlipidemia: Secondary | ICD-10-CM

## 2012-02-16 DIAGNOSIS — E785 Hyperlipidemia, unspecified: Secondary | ICD-10-CM | POA: Insufficient documentation

## 2012-02-16 LAB — LIPID PANEL
Cholesterol: 197 mg/dL (ref 0–200)
HDL: 43 mg/dL (ref >39)

## 2012-02-17 ENCOUNTER — Other Ambulatory Visit: Payer: Self-pay | Admitting: *Deleted

## 2012-02-17 MED ORDER — NIACIN ER (ANTIHYPERLIPIDEMIC) 500 MG PO TBCR: 500.0000 mg | EXTENDED_RELEASE_TABLET | Freq: Every day | ORAL | Status: DC

## 2012-05-20 ENCOUNTER — Telehealth: Payer: Self-pay | Admitting: Family Medicine

## 2012-05-20 DIAGNOSIS — Z5189 Encounter for other specified aftercare: Secondary | ICD-10-CM

## 2012-05-20 NOTE — Telephone Encounter (Signed)
LM

## 2012-07-04 ENCOUNTER — Telehealth: Payer: Self-pay | Admitting: Internal Medicine

## 2012-07-04 MED ORDER — NIACIN ER (ANTIHYPERLIPIDEMIC) 500 MG PO TBCR: 500.0000 mg | EXTENDED_RELEASE_TABLET | Freq: Every day | ORAL | Status: DC

## 2012-07-04 NOTE — Telephone Encounter (Signed)
Done

## 2012-07-11 ENCOUNTER — Other Ambulatory Visit: Payer: 59

## 2012-07-11 DIAGNOSIS — E785 Hyperlipidemia, unspecified: Secondary | ICD-10-CM

## 2012-07-11 LAB — LIPID PANEL
HDL: 45 mg/dL (ref >39)
Total CHOL/HDL Ratio: 4.8 Ratio
VLDL: 49 mg/dL — ABNORMAL HIGH (ref 0–40)

## 2012-07-14 ENCOUNTER — Encounter: Payer: Self-pay | Admitting: Family Medicine

## 2012-07-14 ENCOUNTER — Ambulatory Visit (INDEPENDENT_AMBULATORY_CARE_PROVIDER_SITE_OTHER): Payer: 59 | Admitting: Family Medicine

## 2012-07-14 VITALS — BP 138/80 | HR 72 | Ht 67.0 in | Wt 191.0 lb

## 2012-07-14 DIAGNOSIS — F329 Major depressive disorder, single episode, unspecified: Secondary | ICD-10-CM

## 2012-07-14 DIAGNOSIS — F3289 Other specified depressive episodes: Secondary | ICD-10-CM

## 2012-07-14 DIAGNOSIS — E782 Mixed hyperlipidemia: Secondary | ICD-10-CM

## 2012-07-14 MED ORDER — BUPROPION HCL ER (XL) 300 MG PO TB24
300.0000 mg | ORAL_TABLET | Freq: Every day | ORAL | Status: DC
Start: 2012-07-14 — End: 2013-05-31

## 2012-07-14 NOTE — Progress Notes (Signed)
Chief Complaint  Patient presents with  . Follow-up    6 month follow up, had labs done 07/11/12.   HPI:  Depression:  [facility] generic of the wellbutrin didn't seem to work as well as what he was getting from [facility].  Switched back to getting it filled from [facility], about a couple of weeks ago, and he is already doing better.  He had "blah mood".  He felt the best when on the brand of wellbutrin.  No longer feeling depressed--a few weeks ago he was "down in the dumps", irritable, obstinant.  Hyperlipidemia:  He admits that he ran out of Niaspan for about a month, and only restarted it about a week prior to getting labs done.  He had also run out of fish oil, only starting the 2/day the week prior to labs, and hasn't taken any this week either.  +fast food, including large regular sodas.  Gassiness (malodorous) and bloating is much improved since cutting out lactose from his diet.  He checks his blood pressures periodically, and runs 130/high 70's.  Sometimes gets strange pains--sometimes in chest, radiate to L shoulderblade/back.  Sharp at first, but dissipates quickly, comes and goes.  Sometimes also gets in right arm.  No particular relation to exertion (usually occurs when sitting at desk); not related to eating.  Very short-lived and intermittent, not a crushing, tearing pain, more like a "gasp" type of discomfort.  Occasionally gardens, but hasn't otherwise gotten much exercise (just walking at work).  Past Medical History  Diagnosis Date  . Depression   . Elevated cholesterol   . History of migraine headaches    Past Surgical History  Procedure Date  . Vasectomy   . Tonsilectomy, adenoidectomy, bilateral myringotomy and tubes    History   Social History  . Marital Status: Married    Spouse Name: N/A    Number of Children: N/A  . Years of Education: N/A   Occupational History  . Designer, industrial/product    Social History Main Topics  . Smoking status: Never Smoker     . Smokeless tobacco: Not on file  . Alcohol Use: Yes     1 beer or wine 2-3 times per month.  . Drug Use: No  . Sexually Active: Not on file   Other Topics Concern  . Not on file   Social History Narrative  . No narrative on file   Current Outpatient Prescriptions on File Prior to Visit  Medication Sig Dispense Refill  . aspirin 81 MG tablet Take 81 mg by mouth daily.        . B Complex-C (SUPER B COMPLEX PO) Take 1 tablet by mouth daily.        Marland Kitchen buPROPion (WELLBUTRIN XL) 300 MG 24 hr tablet Take 1 tablet (300 mg total) by mouth daily.  90 tablet  3  . Calcium Citrate-Vitamin D 500-400 MG-UNIT CHEW Chew 1 each by mouth daily.        . cetirizine (ZYRTEC) 10 MG tablet Take 10 mg by mouth daily.        . cholecalciferol (VITAMIN D) 1000 UNITS tablet Take 2,000 Units by mouth daily.       . Chromium 500 MCG TABS Take 1 tablet by mouth daily.        . diphenhydrAMINE (BENADRYL) 25 mg capsule Take 50 mg by mouth at bedtime as needed.       . docusate sodium (COLACE) 100 MG capsule Take 100 mg by  mouth daily.        . Lutein-Zeaxanthin 25-5 MG CAPS Take 1 tablet by mouth daily.        . Multiple Vitamins-Minerals (MULTIVITAMIN WITH MINERALS) tablet Take 1 tablet by mouth daily.        . niacin (NIASPAN) 500 MG CR tablet Take 1 tablet (500 mg total) by mouth at bedtime.  30 tablet  1  . Omega-3 Fatty Acids (FISH OIL) 1200 MG CAPS Take 2 capsules by mouth daily.       . vitamin E 400 UNIT capsule Take 400 Units by mouth daily.         No Known Allergies  ROS: Denies fevers, URI/allergy symptoms, cough, shortness of breath, palpitations.  Occasional gassiness.  Denies skin rashes or other concerns, except as per HPI.  PHYSICAL EXAM: BP 138/80  Pulse 72  Ht 5\' 7"  (1.702 m)  Wt 191 lb (86.637 kg)  BMI 29.91 kg/m2 Well developed male, pleasant, in no distress HEENT: PERRL, EOMI, conjunctiva clear Neck: no lymphadenopathy, thyromegaly or carotid bruit Heart: regular rate and rhythm  without murmur Lungs: clear bilaterally Abdomen: soft, nontender, no pulsatile mass.  No organomegaly or mass Extremities: no edema, 2+ pulse Psych: normal mood, affect, hygiene, grooming.  Full range of affect.  Normal eye contact, speech--somewhat soft spoken   Lab Results  Component Value Date   CHOL 218* 07/11/2012   HDL 45 07/11/2012   LDLCALC 124* 07/11/2012   TRIG 246* 07/11/2012   CHOLHDL 4.8 07/11/2012   ASSESSMENT/PLAN: 1. DEPRESSION    2. Mixed hyperlipidemia  Lipid panel, Hepatic function panel  3. Depressive disorder, not elsewhere classified  buPROPion (WELLBUTRIN XL) 300 MG 24 hr tablet   Hyperlipidemia, mixed: Reviewed diet in detail--stop regular sodas, cut back on fries; healthy fast food choices reviewed. Restart fish oil (3000-4000 mg daily) Continue niaspan 500mg  Recheck lipids and lfts in 1 month, when has been on medications more consistently. Has enough meds to last until mid-September. 30 minutes of exercise daily is recommended. If any ongoing/worsening or exertional component to his discomfort (when he start exercise regularly), need to return for further evaluation.  Depression--well controlled on generic from [facility].  Doesn't do as well with generic from [facility].  Can check into price of branded wellbutrin through [facility], but if much higher, continue with current generic from [facility] (as long as they allow it).  F/u 1 month for fasting lipids/LFT's Schedule CPE for 6 months

## 2012-07-14 NOTE — Patient Instructions (Addendum)
Increase the fish oil to 3000-4000 mg daily. Continue to take the Niaspan daily (remember that taking aspirin 30-60 minutes prior help reduce flushing). Cut out regular sodas--drink more water.  Cut back on french fries--choose healthier sides. Try and get at least 30 minutes of exercise daily where your heart rate gets up (at least 10-15 minutes at a time). If you have ongoing problems with chest discomfort, please return for re-evaluation

## 2012-08-11 ENCOUNTER — Other Ambulatory Visit: Payer: 59

## 2012-08-11 DIAGNOSIS — E782 Mixed hyperlipidemia: Secondary | ICD-10-CM

## 2012-08-11 LAB — HEPATIC FUNCTION PANEL
ALT: 39 U/L (ref 0–53)
AST: 27 U/L (ref 0–37)
Total Bilirubin: 0.6 mg/dL (ref 0.3–1.2)
Total Protein: 7 g/dL (ref 6.0–8.3)

## 2012-08-11 LAB — LIPID PANEL
Total CHOL/HDL Ratio: 4.9 Ratio
Triglycerides: 165 mg/dL — ABNORMAL HIGH (ref <150)

## 2012-08-17 ENCOUNTER — Other Ambulatory Visit: Payer: Self-pay | Admitting: *Deleted

## 2012-08-17 DIAGNOSIS — Z79899 Other long term (current) drug therapy: Secondary | ICD-10-CM

## 2012-08-17 DIAGNOSIS — E782 Mixed hyperlipidemia: Secondary | ICD-10-CM

## 2012-08-17 MED ORDER — NIACIN ER (ANTIHYPERLIPIDEMIC) 1000 MG PO TBCR: 1000.0000 mg | EXTENDED_RELEASE_TABLET | Freq: Every day | ORAL | Status: DC

## 2012-10-18 ENCOUNTER — Other Ambulatory Visit: Payer: 59

## 2012-12-22 ENCOUNTER — Ambulatory Visit (INDEPENDENT_AMBULATORY_CARE_PROVIDER_SITE_OTHER): Payer: 59 | Admitting: Family Medicine

## 2012-12-22 ENCOUNTER — Encounter: Payer: Self-pay | Admitting: Family Medicine

## 2012-12-22 VITALS — BP 140/88 | HR 70 | Wt 199.0 lb

## 2012-12-22 DIAGNOSIS — K922 Gastrointestinal hemorrhage, unspecified: Secondary | ICD-10-CM

## 2012-12-22 DIAGNOSIS — R918 Other nonspecific abnormal finding of lung field: Secondary | ICD-10-CM

## 2012-12-22 LAB — HEMOCCULT GUIAC POC 1CARD (OFFICE)

## 2012-12-22 LAB — CBC WITH DIFFERENTIAL/PLATELET
Eosinophils Absolute: 0.2 10*3/uL (ref 0.0–0.7)
Lymphocytes Relative: 35 % (ref 12–46)
MCHC: 34 g/dL (ref 30.0–36.0)
MCV: 91 fL (ref 78.0–100.0)
Monocytes Relative: 8 % (ref 3–12)
Neutrophils Relative %: 53 % (ref 43–77)
RDW: 12.9 % (ref 11.5–15.5)
WBC: 5.6 10*3/uL (ref 4.0–10.5)

## 2012-12-22 LAB — BASIC METABOLIC PANEL
BUN: 15 mg/dL (ref 6–23)
CO2: 25 mEq/L (ref 19–32)
Chloride: 102 mEq/L (ref 96–112)
Glucose, Bld: 107 mg/dL — ABNORMAL HIGH (ref 70–99)
Sodium: 140 mEq/L (ref 135–145)

## 2012-12-22 NOTE — Progress Notes (Signed)
  Subjective:    Patient ID: Curtis Lam, male    DOB: 1961-04-20, 52 y.o.   MRN: [id number]  HPI On December 24 he noted reddish black walker when he had a BM over the next several days he noted black water as well as some bright red blood with lower abdominal pain. He was seen in the emergency room on the 28th he did have guaiac positive stool and apparently his hemoglobin was okay. He had a colonoscopy in 2005/2006 the polypectomy but apparently he was told to repeat the colonoscopy in 10 years. He has history of migraine headaches and takes 4 ibuprofen roughly twice per week An abdominal CT was done which showed a left lower lobe nodule. CT scan was then ordered and apparently nodules were noted on both sides. He also recently stopped all his medications and seems to be doing fairly well. He did this due to the disrupted him of his lifestyle due to the death of his mother-in-law.   Review of Systems     Objective:   Physical Exam Alert and in no distress. Abdominal exam shows decreased bowel sounds. He does have some lower abdominal tenderness but no rebound. Rectal exam does show some raw tissue in the perianal area. No fissure or hemorrhoids noted. No stool noted in the rectal vault and guaiac was negative The emergency room record was reviewed. The pulmonary nodules were 1 and 4 mm in size.      Assessment & Plan:   1. GI bleed  Basic Metabolic Panel, CBC with Differential, Ambulatory referral to Gastroenterology  2. Pulmonary nodules  Basic Metabolic Panel, CBC with Differential   I will refer back to [facility] for followup on this. Some of his symptoms do sound like upper GI type bleeding especially since he is taking an NSAID. Will also set up for repeat CT chest in 3 months. He will discuss starting back on some of his meds with his next scheduled appointment with Dr. Lynelle Doctor

## 2012-12-23 NOTE — Progress Notes (Signed)
Quick Note:  CALLED PT INFORMED HIM OF GI APT Monday 6 AT 1:45 WITH [facility] AND HE WAS ALSO INFORMED OF LABS VERBALIZED UNDERSTANDING ______

## 2013-01-02 HISTORY — PX: COLONOSCOPY: SHX174

## 2013-01-02 LAB — HM COLONOSCOPY: HM Colonoscopy: NORMAL

## 2013-01-04 ENCOUNTER — Encounter: Payer: Self-pay | Admitting: Gastroenterology

## 2013-01-05 ENCOUNTER — Encounter: Payer: Self-pay | Admitting: Internal Medicine

## 2013-01-12 ENCOUNTER — Encounter: Payer: Self-pay | Admitting: Family Medicine

## 2013-01-12 ENCOUNTER — Ambulatory Visit (INDEPENDENT_AMBULATORY_CARE_PROVIDER_SITE_OTHER): Payer: 59 | Admitting: Family Medicine

## 2013-01-12 VITALS — BP 128/90 | HR 76 | Temp 98.3°F | Ht 67.0 in | Wt 197.0 lb

## 2013-01-12 DIAGNOSIS — Z0000 Encounter for general adult medical examination without abnormal findings: Secondary | ICD-10-CM

## 2013-01-12 DIAGNOSIS — R0683 Snoring: Secondary | ICD-10-CM

## 2013-01-12 DIAGNOSIS — Z23 Encounter for immunization: Secondary | ICD-10-CM

## 2013-01-12 DIAGNOSIS — I10 Essential (primary) hypertension: Secondary | ICD-10-CM

## 2013-01-12 DIAGNOSIS — E782 Mixed hyperlipidemia: Secondary | ICD-10-CM

## 2013-01-12 DIAGNOSIS — R5381 Other malaise: Secondary | ICD-10-CM

## 2013-01-12 DIAGNOSIS — R5383 Other fatigue: Secondary | ICD-10-CM

## 2013-01-12 DIAGNOSIS — E7800 Pure hypercholesterolemia, unspecified: Secondary | ICD-10-CM

## 2013-01-12 DIAGNOSIS — Z125 Encounter for screening for malignant neoplasm of prostate: Secondary | ICD-10-CM

## 2013-01-12 DIAGNOSIS — R0609 Other forms of dyspnea: Secondary | ICD-10-CM

## 2013-01-12 LAB — LIPID PANEL
HDL: 44 mg/dL (ref >39)
LDL Cholesterol: 119 mg/dL — ABNORMAL HIGH (ref 0–99)
Total CHOL/HDL Ratio: 4.6 Ratio

## 2013-01-12 LAB — POCT URINALYSIS DIPSTICK
Bilirubin, UA: NEGATIVE
Blood, UA: NEGATIVE
Ketones, UA: NEGATIVE
Protein, UA: NEGATIVE
pH, UA: 5

## 2013-01-12 LAB — COMPREHENSIVE METABOLIC PANEL
ALT: 49 U/L (ref 0–53)
AST: 31 U/L (ref 0–37)
Alkaline Phosphatase: 61 U/L (ref 39–117)
Creat: 0.9 mg/dL (ref 0.50–1.35)
Total Bilirubin: 0.8 mg/dL (ref 0.3–1.2)

## 2013-01-12 LAB — PSA: PSA: 0.56 ng/mL (ref <=4.00)

## 2013-01-12 NOTE — Progress Notes (Signed)
Chief Complaint  Patient presents with  . Annual Exam    fasting annual CPE. Would like to know results of colonoscopy. Scratchy throat started on Tuesday.   Had colonoscopy due to episode of rectal bleeding (see attached ER visit).  He went to [facility]--no records received here.  Pt advised that we did not have report.  He recalls that his wife told him it was normal, just hemorrhoids.  Previously treated here for depression.  He finds that he is doing very well currently, off all meds.  He is sleeping better at night, no longer having to get up early for work, and getting more sleep has made a huge difference. He no longer takes any meds or vitamins, and moods are good.  Elevated BP's in past.  It was 140/80 at colonoscopy, otherwise hasn't been checking BP's regularly.  Not getting exercise.  Hyperlipidemia. Previously took niaspan which caused flushing at 1000mg , intermittently, but tolerable.  Stopped all meds when his mother-in-law was ill.  Willing to restart if needed.  Health Maintenance: Immunization History  Administered Date(s) Administered  . Influenza Split 08/21/2012  . Influenza Whole 08/21/2009, 09/18/2011  . Td 12/22/2003   Last colonoscopy: 01/02/13 with [facility].  hemorrhoids Last PSA: 01/2011 Dentist: twice yearly Ophtho: every 3 years Exercise:  None  Past Medical History  Diagnosis Date  . Depression   . Elevated cholesterol   . History of migraine headaches   . Rectal polyp     Past Surgical History  Procedure Date  . Vasectomy   . Tonsillectomy   . Colonoscopy 01/02/13    History   Social History  . Marital Status: Married    Spouse Name: N/A    Number of Children: N/A  . Years of Education: N/A   Occupational History  . Designer, industrial/product    Social History Main Topics  . Smoking status: Never Smoker   . Smokeless tobacco: Never Used  . Alcohol Use: Yes     Comment: 1 beer or wine 2-3 times per month, more in summer than winter  . Drug Use: No    . Sexually Active: Yes -- Male partner(s)   Other Topics Concern  . Not on file   Social History Narrative   Lives with wife and son.  Other son lives in [place]   Family History  Problem Relation Age of Onset  . Cancer Paternal Aunt     uterine, ovarian  . Diabetes Maternal Grandfather   . Heart disease Maternal Grandfather   . Alzheimer's disease Father   . Depression Sister   . Diabetes Son 30   CURRENT MEDS:  NONE No Known Allergies  ROS:  The patient denies anorexia, fever, weight changes, headaches,  vision loss, decreased hearing, ear pain, hoarseness, palpitations, dizziness, syncope, dyspnea on exertion, cough, swelling, nausea, vomiting, diarrhea, constipation, abdominal pain, melena, hematochezia (resolved), indigestion/heartburn, hematuria, incontinence, erectile dysfunction, nocturia, weakened urine stream, dysuria, genital lesions, joint pains, numbness, tingling, weakness, tremor, suspicious skin lesions, depression, anxiety, abnormal bleeding/bruising, or enlarged lymph nodes Scratchy throat and congestion x a few days. Chest pain--short lived, L side, stabbing, a few episodes in a row, at rest.  Up to 2-3x/day, then stops for a while.  Unchanged x years  +snoring, irregular breathing at times per wife.  Wakes up feeling unrefreshed, tired during the day, can fall asleep easily.  PHYSICAL EXAM: BP 128/90  Pulse 76  Temp 98.3 F (36.8 C) (Oral)  Ht 5\' 7"  (1.702 m)  Wt 197 lb (89.359 kg)  BMI 30.85 kg/m2 134/94  General Appearance:    Alert, cooperative, no distress, appears stated age  Head:    Normocephalic, without obvious abnormality, atraumatic  Eyes:    PERRL, conjunctiva/corneas clear, EOM's intact, fundi    benign  Ears:    Normal TM's and external ear canals  Nose:   Nares normal, mucosa mildly edematous, no purulence or erythema.  no drainage or sinus tenderness  Throat:   Lips, mucosa, and tongue normal; teeth and gums normal  Neck:   Supple, no  lymphadenopathy;  thyroid:  no   enlargement/tenderness/nodules; no carotid   bruit or JVD  Back:    Spine nontender, no curvature, ROM normal, no CVA     tenderness  Lungs:     Clear to auscultation bilaterally without wheezes, rales or     ronchi; respirations unlabored  Chest Wall:    No tenderness or deformity   Heart:    Regular rate and rhythm, S1 and S2 normal, no murmur, rub   or gallop  Breast Exam:    No chest wall tenderness, masses or gynecomastia  Abdomen:     Soft, non-tender, nondistended, normoactive bowel sounds,    no masses, no hepatosplenomegaly  Genitalia:    Normal male external genitalia without lesions.  Testicles without masses.  No inguinal hernias.  Rectal:   exam declined by pt (had recent rectal exams related to bloody stool)  Extremities:   No clubbing, cyanosis or edema  Pulses:   2+ and symmetric all extremities  Skin:   Skin color, texture, turgor normal, no rashes or lesions  Lymph nodes:   Cervical, supraclavicular, and axillary nodes normal  Neurologic:   CNII-XII intact, normal strength, sensation and gait; reflexes 2+ and symmetric throughout          Psych:   Normal mood, affect, hygiene and grooming.       Chemistry      Component Value Date/Time   NA 140 12/22/2012 0857   K 4.3 12/22/2012 0857   CL 102 12/22/2012 0857   CO2 25 12/22/2012 0857   BUN 15 12/22/2012 0857   CREATININE 0.86 12/22/2012 0857   CREATININE 1.0 10/22/2009 1549      Component Value Date/Time   CALCIUM 10.2 12/22/2012 0857   ALKPHOS 53 08/11/2012 0838   AST 27 08/11/2012 0838   ALT 39 08/11/2012 0838   BILITOT 0.6 08/11/2012 0838     Glucose nonfasting 107  Lab Results  Component Value Date   WBC 5.6 12/22/2012   HGB 17.1* 12/22/2012   HCT 50.3 12/22/2012   MCV 91.0 12/22/2012   PLT 207 12/22/2012   ASSESSMENT/PLAN: 1. Routine general medical examination at a health care facility  Visual acuity screening, POCT Urinalysis Dipstick  2. Mixed hyperlipidemia  Lipid Panel  3. Pure  hypercholesterolemia  Comprehensive metabolic panel  4. HYPERTENSION  Comprehensive metabolic panel  5. Special screening for malignant neoplasm of prostate  PSA  6. Need for Tdap vaccination  Tdap vaccine greater than or equal to 7yo IM  7. Snoring  Split night study  8. Fatigue  Split night study   Discussed PSA screening (risks/benefits), recommended at least 30 minutes of aerobic activity at least 5 days/week; proper sunscreen use reviewed; healthy diet and alcohol recommendations (less than or equal to 2 drinks/day) reviewed; regular seatbelt use; changing batteries in smoke detectors. Self-testicular exams. Immunization recommendations discussed, TdaP given.  Colonoscopy recommendations reviewed, UTD--to try  and get [place] results from [facility].  ?sleep apnea.  +daytime fatigue, despite sleeping more hours. Vs treatment of nasal congestion/snoring Refer for SLEEP STUDY  Elevated BP--monitor elsewhere, low sodium diet, regular exercise, weight loss.  If no sleep apnea, and BP's remain elevated, may need treatment with anti-hypertensive agents  Depression--resolved.

## 2013-01-12 NOTE — Patient Instructions (Addendum)
HEALTH MAINTENANCE RECOMMENDATIONS:  It is recommended that you get at least 30 minutes of aerobic exercise at least 5 days/week (for weight loss, you may need as much as 60-90 minutes). This can be any activity that gets your heart rate up. This can be divided in 10-15 minute intervals if needed, but try and build up your endurance at least once a week.  Weight bearing exercise is also recommended twice weekly.  Eat a healthy diet with lots of vegetables, fruits and fiber.  "Colorful" foods have a lot of vitamins (ie green vegetables, tomatoes, red peppers, etc).  Limit sweet tea, regular sodas and alcoholic beverages, all of which has a lot of calories and sugar.  Up to 2 alcoholic drinks daily may be beneficial for men (unless trying to lose weight, watch sugars).  Drink a lot of water.  Sunscreen of at least SPF 30 should be used on all sun-exposed parts of the skin when outside between the hours of 10 am and 4 pm (not just when at beach or pool, but even with exercise, golf, tennis, and yard work!)  Use a sunscreen that says "broad spectrum" so it covers both UVA and UVB rays, and make sure to reapply every 1-2 hours.  Remember to change the batteries in your smoke detectors when changing your clock times in the spring and fall.  Use your seat belt every time you are in a car, and please drive safely and not be distracted with cell phones and texting while driving.   We are referring you for sleep study to look for sleep apnea.   Your blood pressure is high (sleep apnea can contribute, but if no apnea on test, we need to work at getting this down).  Goal blood pressure is <135/85  Low sodium diet, daily exercise, weight loss.  2 Gram Low Sodium Diet A 2 gram sodium diet restricts the amount of sodium in the diet to no more than 2 g or 2000 mg daily. Limiting the amount of sodium is often used to help lower blood pressure. It is important if you have heart, liver, or kidney problems. Many  foods contain sodium for flavor and sometimes as a preservative. When the amount of sodium in a diet needs to be low, it is important to know what to look for when choosing foods and drinks. The following includes some information and guidelines to help make it easier for you to adapt to a low sodium diet. QUICK TIPS  Do not add salt to food.  Avoid convenience items and fast food.  Choose unsalted snack foods.  Buy lower sodium products, often labeled as "lower sodium" or "no salt added."  Check food labels to learn how much sodium is in 1 serving.  When eating at a restaurant, ask that your food be prepared with less salt or none, if possible. READING FOOD LABELS FOR SODIUM INFORMATION The nutrition facts label is a good place to find how much sodium is in foods. Look for products with no more than 500 to 600 mg of sodium per meal and no more than 150 mg per serving. Remember that 2 g = 2000 mg. The food label may also list foods as:  Sodium-free: Less than 5 mg in a serving.  Very low sodium: 35 mg or less in a serving.  Low-sodium: 140 mg or less in a serving.  Light in sodium: 50% less sodium in a serving. For example, if a food that usually has 300  mg of sodium is changed to become light in sodium, it will have 150 mg of sodium.  Reduced sodium: 25% less sodium in a serving. For example, if a food that usually has 400 mg of sodium is changed to reduced sodium, it will have 300 mg of sodium. CHOOSING FOODS Grains  Avoid: Salted crackers and snack items. Some cereals, including instant hot cereals. Bread stuffing and biscuit mixes. Seasoned rice or pasta mixes.  Choose: Unsalted snack items. Low-sodium cereals, oats, puffed wheat and rice, shredded wheat. English muffins and bread. Pasta. Meats  Avoid: Salted, canned, smoked, spiced, pickled meats, including fish and poultry. Bacon, ham, sausage, cold cuts, hot dogs, anchovies.  Choose: Low-sodium canned tuna and salmon.  Fresh or frozen meat, poultry, and fish. Dairy  Avoid: Processed cheese and spreads. Cottage cheese. Buttermilk and condensed milk. Regular cheese.  Choose: Milk. Low-sodium cottage cheese. Yogurt. Sour cream. Low-sodium cheese. Fruits and Vegetables  Avoid: Regular canned vegetables. Regular canned tomato sauce and paste. Frozen vegetables in sauces. Olives. Rosita Fire. Relishes. Sauerkraut.  Choose: Low-sodium canned vegetables. Low-sodium tomato sauce and paste. Frozen or fresh vegetables. Fresh and frozen fruit. Condiments  Avoid: Canned and packaged gravies. Worcestershire sauce. Tartar sauce. Barbecue sauce. Soy sauce. Steak sauce. Ketchup. Onion, garlic, and table salt. Meat flavorings and tenderizers.  Choose: Fresh and dried herbs and spices. Low-sodium varieties of mustard and ketchup. Lemon juice. Tabasco sauce. Horseradish. SAMPLE 2 GRAM SODIUM MEAL PLAN Breakfast / Sodium (mg)  1 cup low-fat milk / 143 mg  2 slices whole-wheat toast / 270 mg  1 tbs heart-healthy margarine / 153 mg  1 hard-boiled egg / 139 mg  1 small orange / 0 mg Lunch / Sodium (mg)  1 cup raw carrots / 76 mg   cup hummus / 298 mg  1 cup low-fat milk / 143 mg   cup red grapes / 2 mg  1 whole-wheat pita bread / 356 mg Dinner / Sodium (mg)  1 cup whole-wheat pasta / 2 mg  1 cup low-sodium tomato sauce / 73 mg  3 oz lean ground beef / 57 mg  1 small side salad (1 cup raw spinach leaves,  cup cucumber,  cup yellow bell pepper) with 1 tsp olive oil and 1 tsp red wine vinegar / 25 mg Snack / Sodium (mg)  1 container low-fat vanilla yogurt / 107 mg  3 graham cracker squares / 127 mg Nutrient Analysis  Calories: 2033  Protein: 77 g  Carbohydrate: 282 g  Fat: 72 g  Sodium: 1971 mg Document Released: 12/07/2005 Document Revised: 02/29/2012 Document Reviewed: 03/10/2010 [facility] Patient Information 2013 [place], [place].

## 2013-01-16 ENCOUNTER — Other Ambulatory Visit: Payer: Self-pay | Admitting: *Deleted

## 2013-01-16 DIAGNOSIS — R7301 Impaired fasting glucose: Secondary | ICD-10-CM

## 2013-01-16 DIAGNOSIS — E782 Mixed hyperlipidemia: Secondary | ICD-10-CM

## 2013-02-10 ENCOUNTER — Encounter (HOSPITAL_BASED_OUTPATIENT_CLINIC_OR_DEPARTMENT_OTHER): Payer: 59

## 2013-03-23 ENCOUNTER — Telehealth: Payer: Self-pay

## 2013-03-23 NOTE — Telephone Encounter (Signed)
CALLED PT TO GET NEW INS.INFO BUT PT WAS LAID OFF AT END OF January AND HAS NOT GOT ANY HEALTH INS.TO HAVE HIS CT OF CHEST TO REEVALUATE HIS PULM. NODULES PT STATES HE WILL CALL us WHEN HE DOES GET INS.SO WE CAN SET THIS UP.

## 2013-05-21 DIAGNOSIS — I639 Cerebral infarction, unspecified: Secondary | ICD-10-CM

## 2013-05-21 HISTORY — DX: Cerebral infarction, unspecified: I63.9

## 2013-05-31 ENCOUNTER — Inpatient Hospital Stay (HOSPITAL_COMMUNITY): Payer: BC Managed Care – PPO

## 2013-05-31 ENCOUNTER — Emergency Department (HOSPITAL_COMMUNITY): Payer: BC Managed Care – PPO

## 2013-05-31 ENCOUNTER — Encounter (HOSPITAL_COMMUNITY): Payer: Self-pay

## 2013-05-31 ENCOUNTER — Inpatient Hospital Stay (HOSPITAL_COMMUNITY)
Admission: EM | Admit: 2013-05-31 | Discharge: 2013-06-02 | DRG: 533 | Disposition: A | Discharge status: Home / Self Care | Payer: BC Managed Care – PPO | Attending: Internal Medicine | Admitting: Internal Medicine

## 2013-05-31 DIAGNOSIS — R29898 Other symptoms and signs involving the musculoskeletal system: Secondary | ICD-10-CM | POA: Diagnosis present

## 2013-05-31 DIAGNOSIS — R030 Elevated blood-pressure reading, without diagnosis of hypertension: Secondary | ICD-10-CM

## 2013-05-31 DIAGNOSIS — I7774 Dissection of vertebral artery: Secondary | ICD-10-CM | POA: Diagnosis present

## 2013-05-31 DIAGNOSIS — I6350 Cerebral infarction due to unspecified occlusion or stenosis of unspecified cerebral artery: Principal | ICD-10-CM

## 2013-05-31 DIAGNOSIS — G43909 Migraine, unspecified, not intractable, without status migrainosus: Secondary | ICD-10-CM | POA: Diagnosis present

## 2013-05-31 DIAGNOSIS — F329 Major depressive disorder, single episode, unspecified: Secondary | ICD-10-CM | POA: Diagnosis present

## 2013-05-31 DIAGNOSIS — R7309 Other abnormal glucose: Secondary | ICD-10-CM | POA: Diagnosis present

## 2013-05-31 DIAGNOSIS — F3289 Other specified depressive episodes: Secondary | ICD-10-CM | POA: Diagnosis present

## 2013-05-31 DIAGNOSIS — E782 Mixed hyperlipidemia: Secondary | ICD-10-CM

## 2013-05-31 DIAGNOSIS — Z7982 Long term (current) use of aspirin: Secondary | ICD-10-CM

## 2013-05-31 DIAGNOSIS — E781 Pure hyperglyceridemia: Secondary | ICD-10-CM

## 2013-05-31 DIAGNOSIS — Z8673 Personal history of transient ischemic attack (TIA), and cerebral infarction without residual deficits: Secondary | ICD-10-CM

## 2013-05-31 DIAGNOSIS — I639 Cerebral infarction, unspecified: Secondary | ICD-10-CM

## 2013-05-31 LAB — CBC WITH DIFFERENTIAL/PLATELET
Eosinophils Absolute: 0.2 10*3/uL (ref 0.0–0.7)
Eosinophils Relative: 3 % (ref 0–5)
HCT: 47.6 % (ref 39.0–52.0)
Hemoglobin: 16.3 g/dL (ref 13.0–17.0)
Lymphs Abs: 1.3 10*3/uL (ref 0.7–4.0)
MCH: 30.8 pg (ref 26.0–34.0)
MCV: 89.8 fL (ref 78.0–100.0)
Monocytes Relative: 8 % (ref 3–12)
RBC: 5.3 MIL/uL (ref 4.22–5.81)

## 2013-05-31 LAB — COMPREHENSIVE METABOLIC PANEL
Alkaline Phosphatase: 55 U/L (ref 39–117)
BUN: 20 mg/dL (ref 6–23)
GFR calc Af Amer: >90 mL/min (ref >90)
Glucose, Bld: 116 mg/dL — ABNORMAL HIGH (ref 70–99)
Potassium: 4.4 mEq/L (ref 3.5–5.1)
Total Protein: 7.5 g/dL (ref 6.0–8.3)

## 2013-05-31 LAB — GLUCOSE, CAPILLARY: Glucose-Capillary: 96 mg/dL (ref 70–99)

## 2013-05-31 LAB — TROPONIN I: Troponin I: <0.3 ng/mL (ref <0.30)

## 2013-05-31 LAB — APTT: aPTT: 25 seconds (ref 24–37)

## 2013-05-31 MED ORDER — ENOXAPARIN SODIUM 40 MG/0.4ML ~~LOC~~ SOLN
40.0000 mg | Freq: Every day | SUBCUTANEOUS | Status: DC
  Administered 2013-05-31 – 2013-06-01 (×2): 40 mg via SUBCUTANEOUS
  Filled 2013-05-31 (×3): qty 0.4

## 2013-05-31 MED ORDER — INSULIN ASPART 100 UNIT/ML ~~LOC~~ SOLN: 0.0000 [IU] | Freq: Three times a day (TID) | SUBCUTANEOUS | Status: DC

## 2013-05-31 MED ORDER — ASPIRIN 300 MG RE SUPP
300.0000 mg | Freq: Every day | RECTAL | Status: DC
  Filled 2013-05-31 (×2): qty 1

## 2013-05-31 MED ORDER — ONDANSETRON HCL 4 MG/2ML IJ SOLN: 4.0000 mg | Freq: Four times a day (QID) | INTRAMUSCULAR | Status: DC | PRN

## 2013-05-31 MED ORDER — SENNOSIDES-DOCUSATE SODIUM 8.6-50 MG PO TABS: 1.0000 | ORAL_TABLET | Freq: Every evening | ORAL | Status: DC | PRN

## 2013-05-31 MED ORDER — ASPIRIN 325 MG PO TABS
325.0000 mg | ORAL_TABLET | Freq: Every day | ORAL | Status: DC
  Administered 2013-05-31 – 2013-06-01 (×2): 325 mg via ORAL
  Filled 2013-05-31 (×2): qty 1

## 2013-05-31 MED ORDER — ONDANSETRON 4 MG PO TBDP
4.0000 mg | ORAL_TABLET | Freq: Once | ORAL | Status: AC
  Administered 2013-05-31: 4 mg via ORAL
  Filled 2013-05-31: qty 1

## 2013-05-31 MED ORDER — ACETAMINOPHEN 325 MG PO TABS
650.0000 mg | ORAL_TABLET | ORAL | Status: DC | PRN
  Administered 2013-05-31 – 2013-06-02 (×3): 650 mg via ORAL
  Filled 2013-05-31 (×3): qty 2

## 2013-05-31 MED ORDER — ACETAMINOPHEN 650 MG RE SUPP: 650.0000 mg | RECTAL | Status: DC | PRN

## 2013-05-31 MED ORDER — ASPIRIN 81 MG PO CHEW
324.0000 mg | CHEWABLE_TABLET | Freq: Once | ORAL | Status: AC
  Administered 2013-05-31: 324 mg via ORAL
  Filled 2013-05-31: qty 4

## 2013-05-31 NOTE — ED Notes (Signed)
MD at bedside. 

## 2013-05-31 NOTE — ED Provider Notes (Signed)
History     CSN: [id number]  Arrival date & time 05/31/13  7846   First MD Initiated Contact with Patient 05/31/13 [phone]      Chief Complaint  Patient presents with  . Dizziness    (Consider location/radiation/quality/duration/timing/severity/associated sxs/prior treatment) HPI Pt with history of migraines states he has been having R sided posterior HA for several months and worse for the past month. No vision changes, photophobia, focal weakness or sensory changes. Pt presents to day with acute onset dizziness at 0630, described as room spinning. States he was doing online quiz when symptoms began. Became diaphoretic with nausea. States he was off balance while walking but had no focal weakness despite EMS report or RN note. Pt states symptoms have mostly resolved though still has mild nausea. Pt states episode lasted roughly 10 min. Pt has had several similar episodes lasting several min over the past 2 weeks brought on by turning head. No hearing changes, fever, chills, URI symptoms.  Past Medical History  Diagnosis Date  . Depression   . Elevated cholesterol   . History of migraine headaches   . Rectal polyp     Past Surgical History  Procedure Laterality Date  . Vasectomy    . Tonsillectomy    . Colonoscopy  01/02/13    Family History  Problem Relation Age of Onset  . Cancer Paternal Aunt     uterine, ovarian  . Diabetes Maternal Grandfather   . Heart disease Maternal Grandfather   . Alzheimer's disease Father   . Depression Sister   . Diabetes Son 30    History  Substance Use Topics  . Smoking status: Never Smoker   . Smokeless tobacco: Never Used  . Alcohol Use: Yes     Comment: 1 beer or wine 2-3 times per month, more in summer than winter      Review of Systems  Constitutional: Negative for fever and chills.  HENT: Negative for hearing loss, ear pain, congestion, sore throat, rhinorrhea, neck pain, neck stiffness, sinus pressure and tinnitus.   Eyes:  Negative for photophobia.  Respiratory: Negative for shortness of breath.   Cardiovascular: Negative for chest pain.  Gastrointestinal: Positive for nausea. Negative for vomiting, abdominal pain, diarrhea and constipation.  Musculoskeletal: Negative for myalgias and back pain.  Skin: Negative for rash and wound.  Neurological: Positive for dizziness and headaches. Negative for syncope, speech difficulty, weakness, light-headedness and numbness.  All other systems reviewed and are negative.    Allergies  Review of patient's allergies indicates no known allergies.  Home Medications   Current Outpatient Rx  Name  Route  Sig  Dispense  Refill  . cetirizine (ZYRTEC) 10 MG tablet   Oral   Take 10 mg by mouth every evening.          Marland Kitchen ibuprofen (ADVIL,MOTRIN) 200 MG tablet   Oral   Take 600 mg by mouth every 6 (six) hours as needed for pain.           BP 137/93  Pulse 69  Temp(Src) 98.2 F (36.8 C) (Oral)  Resp 16  SpO2 96%  Physical Exam  Nursing note and vitals reviewed. Constitutional: He is oriented to person, place, and time. He appears well-developed and well-nourished. No distress.  HENT:  Head: Normocephalic and atraumatic.  Right Ear: External ear normal.  Left Ear: External ear normal.  Mouth/Throat: Oropharynx is clear and moist. No oropharyngeal exudate.  No sinus tenderness  Eyes: EOM are normal. Pupils are  equal, round, and reactive to light.  Neck: Normal range of motion. Neck supple.  No meningismus   Cardiovascular: Normal rate and regular rhythm.   Pulmonary/Chest: Effort normal and breath sounds normal. No respiratory distress. He has no wheezes. He has no rales. He exhibits no tenderness.  Abdominal: Soft. Bowel sounds are normal. He exhibits no distension and no mass. There is no tenderness. There is no rebound and no guarding.  Musculoskeletal: Normal range of motion. He exhibits no edema and no tenderness.  Neurological: He is alert and oriented  to person, place, and time.  CN II-XII intact, 5/5 motor in all ext, sensation intact, finger to nose bl intact, patellar DTR bl intact.   Skin: Skin is warm and dry. No rash noted. No erythema.  Psychiatric: He has a normal mood and affect. His behavior is normal.    ED Course  Procedures (including critical care time)  Labs Reviewed  COMPREHENSIVE METABOLIC PANEL - Abnormal; Notable for the following:    Glucose, Bld 116 (*)    All other components within normal limits  CBC WITH DIFFERENTIAL  TROPONIN I  PROTIME-INR  APTT   Mr Brain Wo Contrast  05/31/2013   *RADIOLOGY REPORT*  Clinical Data: Acute vertigo.  Headache.  MRI HEAD WITHOUT CONTRAST  Technique:  Multiplanar, multiecho pulse sequences of the brain and surrounding structures were obtained according to standard protocol without intravenous contrast.  Comparison: Head CT 01/12/2007  Findings: Diffusion imaging suggests a punctate acute infarction in the lateral left cerebellum.  No other acute finding.  The brainstem is normal.  The cerebral hemispheres are normal.  No mass lesion, hemorrhage, hydrocephalus or extra-axial collection.  There is no flow in the left vertebral artery.  The right vertebral arteries patent to the basilar.  Both carotids show flow.  No pituitary mass.  No fluid in the sinuses, middle ears or mastoids.  IMPRESSION: No flow demonstrated in the left vertebral artery.  The age of this is unknown but this could be an acute development.  Diffusion imaging suggests a punctate acute infarction in the lateral left cerebellar hemisphere.   Original Report Authenticated By: Paulina Fusi, M.D.     1. Acute ischemic stroke   2. Elevated BP   3. Hypertriglyceridemia   4. Mixed hyperlipidemia      Date: 05/31/2013  Rate: 61  Rhythm: normal sinus rhythm  QRS Axis: normal  Intervals: normal  ST/T Wave abnormalities: normal  Conduction Disutrbances:none  Narrative Interpretation:   Old EKG Reviewed: none  available    MDM  Pt is now completely asymptomatic. No weakness, numbness or visual changes.   Discussed with Dr Roseanne Reno at 11:15. Stated that since pt had no symptoms, not to call code stroke. Advised tranfer to [facility] admit.   Dr Tat to see in ED.       Loren Racer, MD 05/31/13 [phone]

## 2013-05-31 NOTE — ED Notes (Addendum)
Patient describes dizziness as a feeling of spinning correlating with when he moves his head, which leads to some nausea, some dry heaves, and a loss of balance. Patient also reports migraines as a knot feeling in the back of his head which has been going on for years. Patient denies any tinnitus or ear pain. Dr. Ranae Palms, MD, at bedside.

## 2013-05-31 NOTE — Consult Note (Signed)
Referring Physician: Susie Cassette    Chief Complaint: Stroke  HPI:                                                                                                                                         Curtis Lam is an 52 y.o. 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 The patient states he flexed his neck at which time he had acute onset of vertical vertigo. He walked out and threw up over his porch rail.  When he turned around he noted his left leg was clumsy and his mother noted he was very ataxic.  The entire episode lasted approximately 10 minutes but his wife tells me he has been having on and off episodes of vertigo for the past month. None of his past episodes have bees as sever. He does suffer from intermittent pain that starts in the occipital portion of his head on the right and will traverse up and around his ear.  HE has been told these are migraines. For these he has been taking up to 20 tablets per week of ibuprofen.  He denies any period of diplopia, dysarthria, or dysphagia. 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      Date last known well: 6.11.14 Time last known well: 6:30 tPA Given: No: out of window  Past Medical History  Diagnosis Date  . Depression   . Elevated cholesterol   . History of migraine headaches   . Rectal polyp     Past Surgical History  Procedure Laterality Date  . Vasectomy    . Tonsillectomy    . Colonoscopy  01/02/13    Family History  Problem Relation Age of Onset  . Cancer Paternal Aunt     uterine, ovarian  . Diabetes Maternal Grandfather   . Heart disease Maternal Grandfather   . Alzheimer's disease Father   . Depression Sister   . Diabetes Son 11   Social History:  reports that he has never smoked. He has never used smokeless tobacco. He reports that   drinks alcohol. He reports that he does not use illicit drugs.  Allergies: No Known Allergies  Medications:  Prior to Admission:  Prescriptions prior to admission  Medication Sig Dispense Refill  . cetirizine (ZYRTEC) 10 MG tablet Take 10 mg by mouth every evening.       Marland Kitchen ibuprofen (ADVIL,MOTRIN) 200 MG tablet Take 600 mg by mouth every 6 (six) hours as needed for pain.       Scheduled: . aspirin  300 mg Rectal Daily   Or  . aspirin  325 mg Oral Daily  . enoxaparin (LOVENOX) injection  40 mg Subcutaneous QHS  . insulin aspart  0-9 Units Subcutaneous TID WC    ROS:                                                                                                                                       History obtained from the patient  General ROS: negative for - chills, fatigue, fever, night sweats, weight gain or weight loss Psychological ROS: negative for - behavioral disorder, hallucinations, memory difficulties, mood swings or suicidal ideation Ophthalmic ROS: negative for - blurry vision, double vision, eye pain or loss of vision ENT ROS: negative for - epistaxis, nasal discharge, oral lesions, sore throat, tinnitus or vertigo Allergy and Immunology ROS: negative for - hives or itchy/watery eyes Hematological and Lymphatic ROS: negative for - bleeding problems, bruising or swollen lymph nodes Endocrine ROS: negative for - galactorrhea, hair pattern changes, polydipsia/polyuria or temperature intolerance Respiratory ROS: negative for - cough, hemoptysis, shortness of breath or wheezing Cardiovascular ROS: negative for - chest pain, dyspnea on exertion, edema or irregular heartbeat Gastrointestinal ROS: negative for - abdominal pain, diarrhea, hematemesis, nausea/vomiting or stool incontinence Genito-Urinary ROS: negative for - dysuria, hematuria, incontinence or  urinary frequency/urgency Musculoskeletal ROS: negative for - joint swelling or muscular weakness Neurological ROS: as noted in HPI Dermatological ROS: negative for rash and skin lesion changes  Neurologic Examination:                                                                                                      Blood pressure 152/81, pulse 59, temperature 98.1 F (36.7 C), temperature source Oral, resp. rate 18, SpO2 97.00%.  Mental Status: Alert, oriented, thought content appropriate.  Speech fluent without evidence of aphasia.  Able to follow 3 step commands without difficulty. Cranial Nerves: II: Discs flat bilaterally; Visual fields grossly normal, pupils equal, round, reactive to light and accommodation III,IV, VI: ptosis not present, extra-ocular motions intact bilaterally, mild nystagmus when looking to the right at far gaze greater than  to the left V,VII: smile symmetric, facial light touch sensation normal bilaterally VIII: hearing normal bilaterally IX,X: gag reflex present XI: bilateral shoulder shrug XII: midline tongue extension Motor: Right : Upper extremity   5/5    Left:     Upper extremity   5/5  Lower extremity   5/5     Lower extremity   5/5 Tone and bulk:normal tone throughout; no atrophy noted Sensory: Pinprick and light touch intact throughout, bilaterally Deep Tendon Reflexes: 2+ and symmetric throughout Plantars: Right: downgoing   Left: downgoing Cerebellar: normal finger-to-nose,  normal heel-to-shin test CV: pulses palpable throughout    Results for orders placed during the hospital encounter of 05/31/13 (from the past 48 hour(s))  CBC WITH DIFFERENTIAL     Status: None   Collection Time    05/31/13  9:20 AM      Result Value Range   WBC 4.9  4.0 - 10.5 K/uL   RBC 5.30  4.22 - 5.81 MIL/uL   Hemoglobin 16.3  13.0 - 17.0 g/dL   HCT 13.0  86.5 - 78.4 %   MCV 89.8  78.0 - 100.0 fL   MCH 30.8  26.0 - 34.0 pg   MCHC 34.2  30.0 - 36.0 g/dL   RDW  69.6  29.5 - 28.4 %   Platelets 159  150 - 400 K/uL   Neutrophils Relative % 62  43 - 77 %   Neutro Abs 3.1  1.7 - 7.7 K/uL   Lymphocytes Relative 26  12 - 46 %   Lymphs Abs 1.3  0.7 - 4.0 K/uL   Monocytes Relative 8  3 - 12 %   Monocytes Absolute 0.4  0.1 - 1.0 K/uL   Eosinophils Relative 3  0 - 5 %   Eosinophils Absolute 0.2  0.0 - 0.7 K/uL   Basophils Relative 1  0 - 1 %   Basophils Absolute 0.0  0.0 - 0.1 K/uL  COMPREHENSIVE METABOLIC PANEL     Status: Abnormal   Collection Time    05/31/13  9:20 AM      Result Value Range   Sodium 138  135 - 145 mEq/L   Potassium 4.4  3.5 - 5.1 mEq/L   Comment: HEMOLYSIS AT THIS LEVEL MAY AFFECT RESULT   Chloride 104  96 - 112 mEq/L   CO2 26  19 - 32 mEq/L   Glucose, Bld 116 (*) 70 - 99 mg/dL   BUN 20  6 - 23 mg/dL   Creatinine, Ser 1.32  0.50 - 1.35 mg/dL   Calcium 9.2  8.4 - 44.0 mg/dL   Total Protein 7.5  6.0 - 8.3 g/dL   Albumin 4.1  3.5 - 5.2 g/dL   AST 29  0 - 37 U/L   ALT 38  0 - 53 U/L   Alkaline Phosphatase 55  39 - 117 U/L   Total Bilirubin 0.3  0.3 - 1.2 mg/dL   GFR calc non Af Amer >90  >90 mL/min   GFR calc Af Amer >90  >90 mL/min   Comment:            The eGFR has been calculated     using the CKD EPI equation.     This calculation has not been     validated in all clinical     situations.     eGFR's persistently     <90 mL/min signify     possible Chronic Kidney Disease.  TROPONIN I  Status: None   Collection Time    05/31/13  9:20 AM      Result Value Range   Troponin I <0.30  <0.30 ng/mL   Comment:            Due to the release kinetics of cTnI,     a negative result within the first hours     of the onset of symptoms does not rule out     myocardial infarction with certainty.     If myocardial infarction is still suspected,     repeat the test at appropriate intervals.  PROTIME-INR     Status: None   Collection Time    05/31/13  9:20 AM      Result Value Range   Prothrombin Time 12.9  11.6 - 15.2  seconds   INR 0.98  0.00 - 1.49  APTT     Status: None   Collection Time    05/31/13  9:20 AM      Result Value Range   aPTT 25  24 - 37 seconds   Mr Brain Wo Contrast  05/31/2013   *RADIOLOGY REPORT*  Clinical Data: Acute vertigo.  Headache.  MRI HEAD WITHOUT CONTRAST  Technique:  Multiplanar, multiecho pulse sequences of the brain and surrounding structures were obtained according to standard protocol without intravenous contrast.  Comparison: Head CT 01/12/2007  Findings: Diffusion imaging suggests a punctate acute infarction in the lateral left cerebellum.  No other acute finding.  The brainstem is normal.  The cerebral hemispheres are normal.  No mass lesion, hemorrhage, hydrocephalus or extra-axial collection.  There is no flow in the left vertebral artery.  The right vertebral arteries patent to the basilar.  Both carotids show flow.  No pituitary mass.  No fluid in the sinuses, middle ears or mastoids.  IMPRESSION: No flow demonstrated in the left vertebral artery.  The age of this is unknown but this could be an acute development.  Diffusion imaging suggests a punctate acute infarction in the lateral left cerebellar hemisphere.   Original Report Authenticated By: Paulina Fusi, M.D.    Assessment and plan discussed with with attending physician and they are in agreement.    Felicie Morn PA-C [facility] [phone]  05/31/2013, 3:14 PM   Assessment: 52 y.o. male with acute left cerebellar infarct. Exam is non focal except for nystagmus when looking to the right at far gaze.  At present time symptoms have resolved.   Stroke Risk Factors - none  Plan: 1. HgbA1c, fasting lipid panel 2. MRA head and neck 3. Echocardiogram 4. Prophylactic therapy-Antiplatelet med: Aspirin - dose 81 mg  5. Risk factor modification 6. Telemetry monitoring 7. Frequent neuro checks 8. PT/OT SLP   Felicie Morn PA-C [facility]  I personally participated in this patient's  evaluation and management including approving the above clinical assessment and management recommendations.  Venetia Maxon M.D. [facility] [phone]

## 2013-05-31 NOTE — ED Notes (Signed)
Per EMS pt c/o dizzy spells off and on x2wks, states having them more often now, c/o nausea now with leg wkness

## 2013-05-31 NOTE — ED Notes (Signed)
AOZ:[id number]<VH> Expected date:<BR> Expected time:<BR> Means of arrival:<BR> Comments:<BR> dizziness

## 2013-05-31 NOTE — H&P (Signed)
[facility] History and Physical  CESARIO WEIDINGER ZOX:[id number] DOB: Oct 06, 1961 DOA: 05/31/2013   PCP: Carollee Herter, MD   Chief Complaint: dizziness, n/v  HPI:  52 year old male with a history of hypertriglyceridemia, borderline diabetes mellitus presents with an acute onset of dizziness/vertigo around 6:30 in the morning on the day of admission, 05/31/2013. The patient was taking some type of surgery on his computer when he flexes his neck to look at the computer screen at which time he had acute onset of dizziness. The patient didn't have difficulty with his gait. He stated that he was able to lift his left leg, but was very uncoordinated and did not feel like he knew what he was going. There was no syncope, nor did the patient experienced a fall. The patient had associated nausea, vomiting, and diaphoresis with his acute onset of vertigo. The entire episode lasted approximately 10 minutes. The patient states that by the time he arrived to the ED, his symptoms have essentially resolved. He denied any visual loss or visual disturbance or dysarthria. He denied any chest discomfort, shortness breath, or palpitations. The patient states for the past 2 months he has intermittent dizziness lasting approximately one to 2 minutes. He states that his dizziness could be elicited whenever he turned his head to the right, but not to the left. There has not been any syncope or falls. The patient has been taking up to 20 tablets per week of ibuprofen for chronic migraine headache. He's been suffering from migraine headaches for the past 20 years. 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 The patient has never seen a neurologist in the past. In addition, the patient was told he had high triglycerides in the past and was previously taking fish oil and niacin 1000 mg daily. However he stopped taking these medications for 6 months  ago.  In the emergency department, the patient's vitals remained stable throughout the hospital stay. The patient's BMP, hepatic panel, and CBC were essentially unremarkable. Troponin was negative x1. EKG showed sinus rhythm without ST-T wave changes. MRI of the brain showed an acute infarct in the left cerebellum and concerns for a possible left vertebral artery occlusion. As a result, neurology was consulted. They have requested that the patient be transferred to [facility].   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 -Neurology has been consulted -Patient will be transferred to [facility] -Aspirin 325mg  daily -Echocardiogram -MRA of the intracranial vessels and vertebrobasilar system -Check hemoglobin A1c, TSH, lipids -Cycle troponins as the patient had diaphoresis, nausea, dizziness, vomiting Borderline diabetes -Check hemoglobin A1c -NovoLog sliding scale -Lipids in the morning Hypertriglyceridemia -The patient stopped taking niacin and fish oil 6 months ago -Recheck lipid panel Elevated blood pressure without diagnosis of hypertension -The patient was told that he had elevated blood pressure that was "borderline" in the past -Continue to monitor -Will allow permissive hypertension in the first 24 hours     Past Medical History  Diagnosis Date  . Depression   . Elevated cholesterol   . History of migraine headaches   . Rectal polyp    Past Surgical History  Procedure Laterality Date  . Vasectomy    . Tonsillectomy    . Colonoscopy  01/02/13   Social History:  reports that he has never smoked. He has never used smokeless tobacco. He reports that  drinks alcohol. He reports that he does not  use illicit drugs.   Family History  Problem Relation Age of Onset  . Cancer Paternal Aunt     uterine, ovarian  . Diabetes Maternal Grandfather   . Heart disease Maternal Grandfather   . Alzheimer's disease Father   .  Depression Sister   . Diabetes Son 30     No Known Allergies    Prior to Admission medications   Medication Sig Start Date End Date Taking? Authorizing Provider  cetirizine (ZYRTEC) 10 MG tablet Take 10 mg by mouth every evening.    Yes Historical Provider, MD  ibuprofen (ADVIL,MOTRIN) 200 MG tablet Take 600 mg by mouth every 6 (six) hours as needed for pain.   Yes Historical Provider, MD    Review of Systems:  Constitutional:  No weight loss, night sweats, Fevers, chills, fatigue.  Head&Eyes: No headache.  No vision loss.  No eye pain or scotoma ENT:  No Difficulty swallowing,Tooth/dental problems,Sore throat,   Cardio-vascular:  No chest pain, Orthopnea, PND, swelling in lower extremities,  dizziness, palpitations  GI:  No  abdominal pain, nausea, vomiting, diarrhea, loss of appetite, hematochezia, melena, heartburn, indigestion, Resp:  No shortness of breath with exertion or at rest. No cough. No coughing up of blood .No wheezing.No chest wall deformity  Skin:  no rash or lesions.  GU:  no dysuria, change in color of urine, no urgency or frequency. No flank pain.  Musculoskeletal:  No joint pain or swelling. No decreased range of motion. No back pain.  Psych:  No change in mood or affect. No depression or anxiety. Neurologic: No headache, no dysesthesia, no focal weakness, no vision loss. No syncope  Physical Exam: Filed Vitals:   05/31/13 0824 05/31/13 1000 05/31/13 1117  BP: 152/81    Pulse: 59 67   Temp: 97.7 F (36.5 C)  98.2 F (36.8 C)  TempSrc: Oral    Resp: 18 18   SpO2: 96% 95%    General:  A&O x 3, NAD, nontoxic, pleasant/cooperative Head/Eye: No conjunctival hemorrhage, no icterus, [place]/AT, No nystagmus ENT:  No icterus,  No thrush, good dentition, no pharyngeal exudate Neck:  No masses, no lymphadenpathy, no bruits CV:  RRR, no rub, no gallop, no S3 Lung:  CTAB, good air movement, no wheeze, no rhonchi Abdomen: soft/NT, +BS, nondistended, no  peritoneal signs Ext: No cyanosis, No rashes, No petechiae, No lymphangitis, No edema Neuro: CNII-XII intact, strength 4/5 in bilateral upper and lower extremities, no dysmetria  Labs on Admission:  Basic Metabolic Panel:  Recent Labs Lab 05/31/13 0920  NA 138  K 4.4  CL 104  CO2 26  GLUCOSE 116*  BUN 20  CREATININE 0.68  CALCIUM 9.2   Liver Function Tests:  Recent Labs Lab 05/31/13 0920  AST 29  ALT 38  ALKPHOS 55  BILITOT 0.3  PROT 7.5  ALBUMIN 4.1   No results found for this basename: LIPASE, AMYLASE,  in the last 168 hours No results found for this basename: AMMONIA,  in the last 168 hours CBC:  Recent Labs Lab 05/31/13 0920  WBC 4.9  NEUTROABS 3.1  HGB 16.3  HCT 47.6  MCV 89.8  PLT 159   Cardiac Enzymes:  Recent Labs Lab 05/31/13 0920  TROPONINI <0.30   BNP: No components found with this basename: POCBNP,  CBG: No results found for this basename: GLUCAP,  in the last 168 hours  Radiological Exams on Admission: Mr Brain Wo Contrast  05/31/2013   *RADIOLOGY REPORT*  Clinical Data:  Acute vertigo.  Headache.  MRI HEAD WITHOUT CONTRAST  Technique:  Multiplanar, multiecho pulse sequences of the brain and surrounding structures were obtained according to standard protocol without intravenous contrast.  Comparison: Head CT 01/12/2007  Findings: Diffusion imaging suggests a punctate acute infarction in the lateral left cerebellum.  No other acute finding.  The brainstem is normal.  The cerebral hemispheres are normal.  No mass lesion, hemorrhage, hydrocephalus or extra-axial collection.  There is no flow in the left vertebral artery.  The right vertebral arteries patent to the basilar.  Both carotids show flow.  No pituitary mass.  No fluid in the sinuses, middle ears or mastoids.  IMPRESSION: No flow demonstrated in the left vertebral artery.  The age of this is unknown but this could be an acute development.  Diffusion imaging suggests a punctate acute  infarction in the lateral left cerebellar hemisphere.   Original Report Authenticated By: Paulina Fusi, M.D.    EKG: Independently reviewed. Sinus rhythm, no ST-T wave changes    Time spent:70 minutes Code Status:   FULL Family Communication:   Family at bedside   Demarkus Remmel, DO  [facility] Pager [phone]  If 7PM-7AM, please contact night-coverage [email or web address] Password [email or web address] 05/31/2013, 1:11 PM

## 2013-06-01 ENCOUNTER — Inpatient Hospital Stay (HOSPITAL_COMMUNITY): Payer: BC Managed Care – PPO

## 2013-06-01 DIAGNOSIS — I519 Heart disease, unspecified: Secondary | ICD-10-CM

## 2013-06-01 LAB — HEMOGLOBIN A1C
Hgb A1c MFr Bld: 5.5 % (ref <5.7)
Mean Plasma Glucose: 111 mg/dL (ref <117)

## 2013-06-01 LAB — LIPID PANEL
HDL: 33 mg/dL — ABNORMAL LOW (ref >39)
LDL Cholesterol: 105 mg/dL — ABNORMAL HIGH (ref 0–99)

## 2013-06-01 LAB — TROPONIN I: Troponin I: <0.3 ng/mL (ref <0.30)

## 2013-06-01 LAB — GLUCOSE, CAPILLARY
Glucose-Capillary: 102 mg/dL — ABNORMAL HIGH (ref 70–99)
Glucose-Capillary: 84 mg/dL (ref 70–99)
Glucose-Capillary: 101 mg/dL — ABNORMAL HIGH (ref 70–99)
Glucose-Capillary: 85 mg/dL (ref 70–99)

## 2013-06-01 MED ORDER — IOHEXOL 350 MG/ML SOLN
50.0000 mL | Freq: Once | INTRAVENOUS | Status: AC | PRN
  Administered 2013-06-01: 50 mL via INTRAVENOUS

## 2013-06-01 MED ORDER — ATORVASTATIN CALCIUM 10 MG PO TABS
10.0000 mg | ORAL_TABLET | Freq: Every day | ORAL | Status: DC
  Administered 2013-06-01: 10 mg via ORAL
  Filled 2013-06-01 (×2): qty 1

## 2013-06-01 MED ORDER — ASPIRIN 81 MG PO CHEW
81.0000 mg | CHEWABLE_TABLET | Freq: Every day | ORAL | Status: DC
  Administered 2013-06-02: 81 mg via ORAL
  Filled 2013-06-01: qty 1

## 2013-06-01 MED ORDER — CLOPIDOGREL BISULFATE 75 MG PO TABS
75.0000 mg | ORAL_TABLET | Freq: Every day | ORAL | Status: DC
  Administered 2013-06-01 – 2013-06-02 (×2): 75 mg via ORAL
  Filled 2013-06-01 (×2): qty 1

## 2013-06-01 MED ORDER — IBUPROFEN 800 MG PO TABS
800.0000 mg | ORAL_TABLET | Freq: Once | ORAL | Status: AC
  Administered 2013-06-01: 800 mg via ORAL
  Filled 2013-06-01: qty 1

## 2013-06-01 MED ORDER — DIVALPROEX SODIUM ER 500 MG PO TB24
500.0000 mg | ORAL_TABLET | Freq: Every day | ORAL | Status: DC
  Administered 2013-06-01 – 2013-06-02 (×2): 500 mg via ORAL
  Filled 2013-06-01 (×2): qty 1

## 2013-06-01 NOTE — Progress Notes (Signed)
[facility] PROGRESS NOTE  DEMONIE KASSA AVW:[id number] DOB: July 11, 1961 DOA: 05/31/2013 PCP: Carollee Herter, MD  Assessment/Plan: Active Problems:   NUMBNESS   Acute ischemic stroke   Hypertriglyceridemia     Acute left cerebellar CVA Hemoglobin A1c pending Lipid panel reviewed MRA of the head and neck pending Echocardiogram pending Continue aspirin 81 mg a day, telemetry monitoring PT OT/SLP pending  Borderline diabetes  -Check hemoglobin A1c  -NovoLog sliding scale  -Lipids in the morning  Start Crestor  Hypertriglyceridemia  -The patient stopped taking niacin and fish oil 6 months ago  -Recheck lipid panel   Elevated blood pressure without diagnosis of hypertension  -The patient was told that he had elevated blood pressure that was "borderline" in the past  -Continue to monitor  -Will allow permissive hypertension in the first 24 hours   Code Status: full Family Communication: family updated about patient's clinical progress Disposition Plan:  As above    Brief narrative: 52 y.o. 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 The patient states he flexed his neck at which time he had acute onset of vertical vertigo. He walked out and threw up over his porch rail. When he turned around he noted his left leg was clumsy and his mother noted he was very ataxic. The entire episode lasted approximately 10 minutes but his wife tells me he has been having on and off episodes of vertigo for the past month. None of his past episodes have bees as sever. He does suffer from intermittent pain that starts in the occipital portion of his head on the right and will traverse up and around his ear. HE has been told these are migraines. For these he has been taking up to 20 tablets per week of ibuprofen. He denies any period of diplopia, dysarthria, or dysphagia. MRI of  the brain showed an acute infarct in the left cerebellum while at [facility] hospital and recent CT head was negative.    Consultants:  Neurology  Procedures:  None  Antibiotics:  None  HPI/Subjective: Vertigo is improved  Objective: Filed Vitals:   05/31/13 2200 06/01/13 0000 06/01/13 0200 06/01/13 0600  BP: 114/75 127/83 143/91 141/98  Pulse: 61 59 61 61  Temp: 98 F (36.7 C) 97.6 F (36.4 C) 97.6 F (36.4 C) 97.5 F (36.4 C)  TempSrc:      Resp: 18 18 18 18   Height:      Weight:      SpO2: 97% 100% 100% 97%   No intake or output data in the 24 hours ending 06/01/13 0853  Exam: General: A&O x 3, NAD, nontoxic, pleasant/cooperative  Head/Eye: No conjunctival hemorrhage, no icterus, [place]/AT, No nystagmus  ENT: No icterus, No thrush, good dentition, no pharyngeal exudate  Neck: No masses, no lymphadenpathy, no bruits  CV: RRR, no rub, no gallop, no S3  Lung: CTAB, good air movement, no wheeze, no rhonchi  Abdomen: soft/NT, +BS, nondistended, no peritoneal signs  Ext: No cyanosis, No rashes, No petechiae, No lymphangitis, No edema  Neuro: CNII-XII intact, strength 4/5 in bilateral upper and lower extremities, no dysmetria   Data Reviewed: Basic Metabolic Panel:  Recent Labs Lab 05/31/13 0920  NA 138  K 4.4  CL 104  CO2 26  GLUCOSE 116*  BUN 20  CREATININE 0.68  CALCIUM 9.2    Liver Function Tests:  Recent Labs Lab  05/31/13 0920  AST 29  ALT 38  ALKPHOS 55  BILITOT 0.3  PROT 7.5  ALBUMIN 4.1   No results found for this basename: LIPASE, AMYLASE,  in the last 168 hours No results found for this basename: AMMONIA,  in the last 168 hours  CBC:  Recent Labs Lab 05/31/13 0920  WBC 4.9  NEUTROABS 3.1  HGB 16.3  HCT 47.6  MCV 89.8  PLT 159    Cardiac Enzymes:  Recent Labs Lab 05/31/13 0920 05/31/13 2243  TROPONINI <0.30 <0.30   BNP (last 3 results) No results found for this basename: PROBNP,  in the last 8760 hours   CBG:  Recent  Labs Lab 05/31/13 1634 05/31/13 2148 06/01/13 0649  GLUCAP 91 96 102*    No results found for this or any previous visit (from the past 240 hour(s)).   Studies: Dg Chest 2 View  05/31/2013   *RADIOLOGY REPORT*  Clinical Data: Stroke.  CHEST - 2 VIEW  Comparison: None.  Findings: The heart size is normal.  An 11 mm nodular density is evident in the left lung.  This appears to be posterior on the lateral view.  The lungs are otherwise clear.  Mild degenerative changes are noted in the thoracic spine.  The visualized soft tissues and bony thorax are otherwise unremarkable.  IMPRESSION:  1.  11 mm nodule in the left lung.  This appears to be within the lower lobe.  Recommend comparison with prior chest radiographs.  If no films are available, CT of the chest with contrast would be useful for further evaluation of this lesion. 2.  No acute cardiopulmonary disease.   Original Report Authenticated By: Marin Roberts, M.D.   Ct Head Wo Contrast  05/31/2013   *RADIOLOGY REPORT*  Clinical Data: Headache following TPA.  CT HEAD WITHOUT CONTRAST  Technique:  Contiguous axial images were obtained from the base of the skull through the vertex without contrast.  Comparison: MRI 05/31/2013  Findings: No acute intracranial hemorrhage.  No focal mass lesion. No CT evidence of acute infarction.   No midline shift or mass effect.  No hydrocephalus.  Basilar cisterns are patent.  Paranasal sinuses and mastoid air cells are clear.  Orbits are normal.  IMPRESSION:  1.  No acute intracranial findings. 2.  No intracranial hemorrhage.  3 .  Small infarct seen on comparison MRI is not visible by CT.   Original Report Authenticated By: Genevive Bi, M.D.   Mr Brain Wo Contrast  05/31/2013   *RADIOLOGY REPORT*  Clinical Data: Acute vertigo.  Headache.  MRI HEAD WITHOUT CONTRAST  Technique:  Multiplanar, multiecho pulse sequences of the brain and surrounding structures were obtained according to standard protocol  without intravenous contrast.  Comparison: Head CT 01/12/2007  Findings: Diffusion imaging suggests a punctate acute infarction in the lateral left cerebellum.  No other acute finding.  The brainstem is normal.  The cerebral hemispheres are normal.  No mass lesion, hemorrhage, hydrocephalus or extra-axial collection.  There is no flow in the left vertebral artery.  The right vertebral arteries patent to the basilar.  Both carotids show flow.  No pituitary mass.  No fluid in the sinuses, middle ears or mastoids.  IMPRESSION: No flow demonstrated in the left vertebral artery.  The age of this is unknown but this could be an acute development.  Diffusion imaging suggests a punctate acute infarction in the lateral left cerebellar hemisphere.   Original Report Authenticated By: Paulina Fusi, M.D.  Scheduled Meds: . aspirin  300 mg Rectal Daily   Or  . aspirin  325 mg Oral Daily  . enoxaparin (LOVENOX) injection  40 mg Subcutaneous QHS  . insulin aspart  0-9 Units Subcutaneous TID WC   Continuous Infusions:   Active Problems:   NUMBNESS   Acute ischemic stroke   Hypertriglyceridemia    Time spent: 40 minutes   [facility]  [facility] Pager [phone]. If 8PM-8AM, please contact night-coverage at [email or web address], password [facility] 06/01/2013, 8:53 AM  LOS: 1 day

## 2013-06-01 NOTE — Progress Notes (Signed)
I agree with the following treatment note after reviewing documentation.   Johnston, Londin Antone Brynn   OTR/L Pager: [phone] Office: [phone] .   

## 2013-06-01 NOTE — Progress Notes (Signed)
I agree with the following treatment note after reviewing documentation.   Johnston, Jaelyn Bourgoin Brynn   OTR/L Pager: [phone] Office: [phone] .   

## 2013-06-01 NOTE — Progress Notes (Signed)
MRA results discussed with and reviewed by Dr. Pearlean Brownie. 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 Will treat with aspirin 81 and plavix 75 mg daily x 3 months then one alone. Will discuss findings and plan with pt in the morning.  Anticipate discharge in am.  Annie Main, MSN, RN, ANVP-BC, ANP-BC, GNP-BC [facility] Pager: [phone] 06/01/2013 5:31 PM  I have personally  evaluated imaging results, and formulated the assessment and plan of care. I agree with the above. Delia Heady, MD

## 2013-06-01 NOTE — Evaluation (Signed)
Speech Language Pathology Evaluation Patient Details Name: Curtis Lam MRN: [id number] DOB: [birth year] Today's Date: 06/01/2013 Time: 0915-0950 SLP Time Calculation (min): 35 min  Problem List:  Patient Active Problem List   Diagnosis Date Noted  . Acute ischemic stroke 05/31/2013  . Hypertriglyceridemia 05/31/2013  . Mixed hyperlipidemia 02/16/2012  . Pure hypercholesterolemia 09/10/2011  . Elevated BP 09/10/2011  . DEPRESSION 10/22/2009  . MIGRAINE HEADACHE 10/22/2009  . HYPERTENSION 10/22/2009  . ALLERGIC RHINITIS 10/22/2009  . HIP PAIN, LEFT 10/22/2009  . PLANTAR FASCIITIS, BILATERAL 10/22/2009  . NUMBNESS 10/22/2009   Past Medical History:  Past Medical History  Diagnosis Date  . Depression   . Elevated cholesterol   . History of migraine headaches   . Rectal polyp    Past Surgical History:  Past Surgical History  Procedure Laterality Date  . Vasectomy    . Tonsillectomy    . Colonoscopy  01/02/13   HPI:  52 year old male admitted  05/31/13 with dizziness, N/V, and left leg clumsiness which resolved by ED visit.  Pt with 20 year hx migraine headaches, and 2 month hx intermittent dizziness.  MRI indicated Left cerebellar infarct.   Assessment / Plan / Recommendation Clinical Impression  Pt completed the Montreal Cognitive Assessment (moCA).  All areas completed accurately with the exception of delayed recall. Pt recalled 4/5 unrelated words after 10 minute delay.  (Normal function = >25/30). No further ST intervention is warranted at this time, however, pt was encouraged to contact physician if home and work duties are more difficult from a cognitive perspective once pt has returned to [employer] responsibilities.    SLP Assessment  Patient does not need any further Speech Lanaguage Pathology Services    Follow Up Recommendations  None    Frequency and Duration        Pertinent Vitals/Pain 5/10 headache   SLP Evaluation Prior Functioning  Cognitive/Linguistic Baseline: Within functional limits Type of Home: House Lives With: Spouse;Son Available Help at Discharge: Family Education: high school graduate Vocation: Full time employment   Cognition  Overall Cognitive Status: Within Functional Limits for tasks assessed Arousal/Alertness: Awake/alert Orientation Level: Oriented X4 Comments: MoCA administered.  Pt scored 29/30 (n=>25/30). Delayed recall of 4/5 words.    Comprehension  Auditory Comprehension Overall Auditory Comprehension: Appears within functional limits for tasks assessed    Expression Expression Primary Mode of Expression: Verbal Verbal Expression Overall Verbal Expression: Appears within functional limits for tasks assessed Written Expression Dominant Hand: Right Written Expression: Within Functional Limits   Oral / Motor Oral Motor/Sensory Function Overall Oral Motor/Sensory Function: Appears within functional limits for tasks assessed Motor Speech Overall Motor Speech: Appears within functional limits for tasks assessed   Curtis Lam [facility], CCC-SLP [phone]  Curtis Lam 06/01/2013, 9:58 AM

## 2013-06-01 NOTE — Progress Notes (Signed)
  Echocardiogram 2D Echocardiogram has been performed.  Jorje Guild 06/01/2013, 3:33 PM

## 2013-06-01 NOTE — Progress Notes (Signed)
Stroke Team Progress Note  HISTORY Curtis Lam is an 52 y.o. 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 The patient states he flexed his neck at which time he had acute onset of vertical vertigo. He walked out and threw up over his porch rail. When he turned around he noted his left leg was clumsy and his mother noted he was very ataxic. The entire episode lasted approximately 10 minutes but his wife tells me he has been having on and off episodes of vertigo for the past month. None of his past episodes have bees as severe. He does suffer from intermittent pain that starts in the occipital portion of his head on the right and will traverse up and around his ear. HE has been told these are migraines. For these he has been taking up to 20 tablets per week of ibuprofen. He denies any period of diplopia, dysarthria, or dysphagia. MRI of the brain showed an acute infarct in the left cerebellum while at [facility] hospital and recent CT head was negative.   Patient was not a TPA candidate secondary to delay in arrival. He was admitted for further evaluation and treatment.  SUBJECTIVE No family is at the bedside.  Overall he feels his condition is gradually improving. Reports he "flexes" his neck 3-4 times per day - does not POP his neck per him.  OBJECTIVE Most recent Vital Signs: Filed Vitals:   06/01/13 0000 06/01/13 0200 06/01/13 0600 06/01/13 1043  BP: 127/83 143/91 141/98 155/88  Pulse: 59 61 61 63  Temp: 97.6 F (36.4 C) 97.6 F (36.4 C) 97.5 F (36.4 C) 97.9 F (36.6 C)  TempSrc:    Oral  Resp: 18 18 18 16   Height:      Weight:      SpO2: 100% 100% 97% 99%   CBG (last 3)   Recent Labs  05/31/13 1634 05/31/13 2148 06/01/13 0649  GLUCAP 91 96 102*    IV Fluid Intake:     MEDICATIONS  . aspirin  300 mg Rectal Daily   Or  . aspirin  325 mg Oral Daily  .  atorvastatin  10 mg Oral q1800  . enoxaparin (LOVENOX) injection  40 mg Subcutaneous QHS  . insulin aspart  0-9 Units Subcutaneous TID WC   PRN:  acetaminophen, acetaminophen, ondansetron (ZOFRAN) IV, senna-docusate  Diet:  Carb Control thin liquids Activity:  As tolerated DVT Prophylaxis:  Lovenox 40 mg sq daily   CLINICALLY SIGNIFICANT STUDIES Basic Metabolic Panel:   Recent Labs Lab 05/31/13 0920  NA 138  K 4.4  CL 104  CO2 26  GLUCOSE 116*  BUN 20  CREATININE 0.68  CALCIUM 9.2   Liver Function Tests:   Recent Labs Lab 05/31/13 0920  AST 29  ALT 38  ALKPHOS 55  BILITOT 0.3  PROT 7.5  ALBUMIN 4.1   CBC:   Recent Labs Lab 05/31/13 0920  WBC 4.9  NEUTROABS 3.1  HGB 16.3  HCT 47.6  MCV 89.8  PLT 159   Coagulation:   Recent Labs Lab 05/31/13 0920  LABPROT 12.9  INR 0.98   Cardiac Enzymes:   Recent Labs Lab 05/31/13 0920 05/31/13 2243  TROPONINI <0.30 <0.30   Urinalysis: No results found for this basename: COLORURINE, APPERANCEUR, LABSPEC, PHURINE, GLUCOSEU, HGBUR, BILIRUBINUR, KETONESUR, PROTEINUR, UROBILINOGEN, NITRITE, LEUKOCYTESUR,  in the last 168 hours Lipid  Panel    Component Value Date/Time   CHOL 192 06/01/2013 0545   TRIG 269* 06/01/2013 0545   HDL 33* 06/01/2013 0545   CHOLHDL 5.8 06/01/2013 0545   VLDL 54* 06/01/2013 0545   LDLCALC 105* 06/01/2013 0545   HgbA1C  Lab Results  Component Value Date   HGBA1C 5.8* 09/07/2011    Urine Drug Screen:   No results found for this basename: labopia,  cocainscrnur,  labbenz,  amphetmu,  thcu,  labbarb    Alcohol Level: No results found for this basename: ETH,  in the last 168 hours  CT of the brain  05/31/2013    1.  No acute intracranial findings. 2.  No intracranial hemorrhage.  3 .  Small infarct seen on comparison MRI is not visible by CT.  MRI of the brain  05/31/2013    No flow demonstrated in the left vertebral artery.  The age of this is unknown but this could be an acute development.   Diffusion imaging s uggests a punctate acute infarction in the lateral left cerebellar hemisphere.  MRA of the brain    2D Echocardiogram    Carotid Doppler    CXR  05/31/2013   1.  11 mm nodule in the left lung.  This appears to be within the lower lobe.  Recommend comparison with prior chest radiographs.  If no films are available, CT of the chest with contrast would be useful for further evaluation of this lesion. 2.  No acute cardiopulmonary disease.  EKG     Therapy Recommendations   Physical Exam   Pleasant young Caucasian male not in distress.Awake alert. Afebrile. Head is nontraumatic. Neck is supple without bruit. Hearing is normal. Cardiac exam no murmur or gallop. Lungs are clear to auscultation. Distal pulses are well felt. Neurological Exam :  Awake  Alert oriented x 3. Normal speech and language.eye movements full without nystagmus.fundi were not visualized. Vision acuity and fields appear normal. Hearing is normal. Palatal movements are normal. Face symmetric. Tongue midline. Normal strength, tone, reflexes and coordination. Normal sensation. Gait cautious but steady. ASSESSMENT Curtis Lam is a 52 y.o. male presenting with acute onset of dizziness/vertigo followed by nausea after flexing his neck. 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 Need further eval to look at [facility] for its possible correlation to stroke.  On no antithrombotics prior to admission. Now on aspirin 325 mg orally every day for secondary stroke prevention. Patient with no resultant neuro deficits. Work up underway.  Hyperlipidemia, LDL 105, on no statins PTA, now on lipitor, goal LDL < 100   Chronic daily migraine headaches for years, less now in the past, takes approx 20 ibuprofen per week. Has not been evaluated recently - has taken imitrex in the past.  Left lung 11mm nodule  Hospital day # 1  TREATMENT/PLAN  Continue aspirin 325  mg orally every day for secondary stroke prevention.  Stop ibuprofen. May lead to rebound headaches  Depakote ER 500 mg daily to help with headache prevention  CT angio of the head and neck to look for L VA occlusion/dissection  Cancel MRI and MRA of the neck  Annie Main, MSN, RN, ANVP-BC, ANP-BC, GNP-BC [facility] Pager: [phone] 06/01/2013 11:08 AM  I have personally obtained a history, examined the patient, evaluated imaging results, and formulated the assessment and plan of care. I agree with the above. Delia Heady, MD

## 2013-06-01 NOTE — Progress Notes (Signed)
Occupational Therapy Discharge Patient Details Name: Curtis Lam MRN: [id number] DOB: Aug 04, 1961 Today's Date: 06/01/2013 Time: 0865-7846 OT Time Calculation (min): 20 min  Patient discharged from OT services secondary to goals met and no further OT needs identified.  Please see latest therapy progress note for current level of functioning and progress toward goals.    Progress and discharge plan discussed with patient and/or caregiver: Patient/Caregiver agrees with plan  GO     Sherryl Manges 06/01/2013, 12:24 PM

## 2013-06-01 NOTE — Progress Notes (Signed)
Utilization review completed. Salihah Peckham, RN, BSN. 

## 2013-06-01 NOTE — Evaluation (Signed)
Physical Therapy Evaluation and Discharge Patient Details Name: Curtis Lam MRN: [id number] DOB: 1961-06-30 Today's Date: 06/01/2013 Time: 1209-1227 PT Time Calculation (min): 18 min  PT Assessment / Plan / Recommendation Clinical Impression  52 y/o male adm. for dizziness/vertigo and reports what sounds to be an episode of ataxia. MRI reveals acute cerebellar infarct and lack of flow through left vertebral artery. Unable to provoke any further vertigo/dizziness with testing today. Balance appears at his baseline without further difficulties. Reviewed importance of modifying his lifestyle with diet and exercise to prevent future strokes. Reviewed safe ramp up for activity/exercise. No further physical therapy needs at this time.     PT Assessment  Patent does not need any further PT services    Follow Up Recommendations  No PT follow up    Does the patient have the potential to tolerate intense rehabilitation      Barriers to Discharge        Equipment Recommendations  None recommended by PT    Recommendations for Other Services     Frequency      Precautions / Restrictions Precautions Precautions: None Precaution Comments: Pt educated on signs/symptoms of Stroke and given booklet Restrictions Weight Bearing Restrictions: No   Pertinent Vitals/Pain  Denies pain      Mobility  Bed Mobility Bed Mobility: Not assessed Transfers Transfers: Stand to Sit Sit to Stand: 7: Independent Stand to Sit: 7: Independent Ambulation/Gait Ambulation/Gait Assistance: 7: Independent Ambulation Distance (Feet): 20 Feet Assistive device: None Ambulation/Gait Assistance Details: observed patient ambulating with OT prior to our session without evidence of imbalance or difficulty Gait Pattern: Within Functional Limits Gait velocity: WFL Modified Rankin (Stroke Patients Only) Pre-Morbid Rankin Score: No symptoms Modified Rankin: No symptoms        Visit Information  Last PT  Received On: 06/01/13 Assistance Needed: +1    Subjective Data  Subjective: I was having dizzy spells for about a week.  Patient Stated Goal: home, prevent future strokes   Prior Functioning  Home Living Lives With: Spouse;Son Available Help at Discharge: Family Type of Home: House Home Access: Stairs to enter [employer] of Steps: 4 half steps Entrance Stairs-Rails: Can reach both Home Layout: One level Bathroom Shower/Tub: Walk-in Contractor: Handicapped height Bathroom Accessibility: Yes How Accessible: Accessible via walker Home Adaptive Equipment: Grab bars in shower Prior Function Level of Independence: Independent Able to Take Stairs?: Yes Driving: Yes Vocation: Full time employment Comments: Designer, industrial/product (hot environment) Communication Communication: No difficulties Dominant Hand: Right    Cognition  Cognition Arousal/Alertness: Awake/alert Behavior During Therapy: WFL for tasks assessed/performed Overall Cognitive Status: Within Functional Limits for tasks assessed    Extremity/Trunk Assessment   Strength and ROM WFL BLE/UE  Balance Balance Balance Assessed: Yes Static Standing Balance Static Standing - Balance Support: No upper extremity supported Static Standing - Level of Assistance: 7: Independent Static Standing - Comment/# of Minutes: standing throught the entire evaluation including visual scanning and VOR testing without LOB or dizziness, unable to provoke any nystagmus with visual scan or VOR; pt able to perform below tests without difficulty, then proceeded to ty SLS with eyes closed bilaterally however unable to maintain when distracted longer than 10 seconds, did lose balance quite significntly performing this highly challenging activity but appropriately opening eyes and stepping to correct without difficulty Single Leg Stance - Right Leg: 30 Single Leg Stance - Left Leg: 30 [place] - Eyes Opened: 60 [place] -  Eyes Closed: 30  End  of Session PT - End of Session Equipment Utilized During Treatment: Gait belt Activity Tolerance: Patient tolerated treatment well Patient left: in chair;with call bell/phone within reach;with nursing in room Nurse Communication: Mobility status  GP     [facility] HELEN 06/01/2013, 2:51 PM

## 2013-06-01 NOTE — Progress Notes (Signed)
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 Pt was indpendent in ADL and mobility PTA. Pt does not need further OT services    OT Assessment  Patient does not need any further OT services    Follow Up Recommendations  No OT follow up                Precautions / Restrictions Precautions Precautions: None Precaution Comments: Pt educated on signs/symptoms of Stroke and given booklet Restrictions Weight Bearing Restrictions: No   Pertinent Vitals/Pain Pt reported no pain during session.    ADL  Upper Body Dressing: Performed;Independent Where Assessed - Upper Body Dressing: Unsupported standing Lower Body Dressing: Performed;Independent Where Assessed - Lower Body Dressing: Unsupported standing Toilet Transfer: Independent Toilet Transfer Method: Sit to Barista: Comfort height toilet Toileting - Clothing Manipulation and Hygiene: Independent Where Assessed - Engineer, mining and Hygiene: Standing Tub/Shower Transfer: Designer, industrial/product Method: Stand pivot Equipment Used: Gait belt Transfers/Ambulation Related to ADLs: Pt indpepdent with no deficits ADL Comments: Pt independent with no deficits      Visit Information  Last OT Received On: 06/01/13       Prior Functioning     Home Living Lives With: Spouse;Son (son is 34) Available Help at Discharge: Family Type of Home: House Home Access: Stairs to enter [employer] of Steps: 4 half steps Entrance Stairs-Rails: Can reach both Home Layout: One level Bathroom Shower/Tub: Walk-in Contractor: Handicapped height Bathroom Accessibility: Yes How Accessible: Accessible via walker Home Adaptive Equipment:  Grab bars in shower Prior Function Level of Independence: Independent Able to Take Stairs?: Yes Driving: Yes Vocation: Full time employment Comments: Designer, industrial/product (hot environment) Communication Communication: No difficulties Dominant Hand: Right         Vision/Perception Vision - History Baseline Vision: Other (comment) (needs glasses for distance and close vision. bifocals) Patient Visual Report: No change from baseline   Cognition  Cognition Arousal/Alertness: Awake/alert Behavior During Therapy: WFL for tasks assessed/performed Overall Cognitive Status: Within Functional Limits for tasks assessed       Mobility Bed Mobility Bed Mobility: Not assessed Transfers Transfers: Sit to Stand;Stand to Sit Sit to Stand: 7: Independent Stand to Sit: 7: Independent        Balance Balance Balance Assessed: Yes Standardized Balance Assessment Standardized Balance Assessment: Dynamic Gait Index Dynamic Gait Index Level Surface: Normal Change in Gait Speed: Normal Gait with Horizontal Head Turns: Normal Gait with Vertical Head Turns: Normal Gait and Pivot Turn: Normal Step Over Obstacle: Normal Step Around Obstacles: Normal Steps: Normal Total Score: 24   End of Session OT - End of Session Equipment Utilized During Treatment: Gait belt Activity Tolerance: Patient tolerated treatment well Patient left: Other (comment) (with PT) Nurse Communication: Mobility status  GO     Sherryl Manges 06/01/2013, 12:21 PM

## 2013-06-01 NOTE — Progress Notes (Signed)
Nutrition Brief Note  Patient identified on the Malnutrition Screening Tool (MST) Report   Pt reports 10 lb weight loss over the last 2-3 months due to healthy diet changes. Reinforced wt loss guidelines.   Body mass index is 30.47 kg/(m^2). Patient meets criteria for Obesity Class I based on current BMI.   Current diet order is CHO Modified Medium, patient is consuming approximately 100% of meals at this time. Labs and medications reviewed.   No nutrition interventions warranted at this time. If nutrition issues arise, please consult RD.   Kendell Bane RD, LDN, CNSC [phone] Pager [phone] After Hours Pager

## 2013-06-02 LAB — GLUCOSE, CAPILLARY
Glucose-Capillary: 91 mg/dL (ref 70–99)
Glucose-Capillary: 143 mg/dL — ABNORMAL HIGH (ref 70–99)

## 2013-06-02 LAB — COMPREHENSIVE METABOLIC PANEL
AST: 24 U/L (ref 0–37)
Albumin: 4 g/dL (ref 3.5–5.2)
Alkaline Phosphatase: 58 U/L (ref 39–117)
BUN: 13 mg/dL (ref 6–23)
Potassium: 4.2 mEq/L (ref 3.5–5.1)
Total Protein: 7.1 g/dL (ref 6.0–8.3)

## 2013-06-02 MED ORDER — ASPIRIN 81 MG PO CHEW: 81.0000 mg | CHEWABLE_TABLET | Freq: Every day | ORAL | Status: DC

## 2013-06-02 MED ORDER — ATORVASTATIN CALCIUM 10 MG PO TABS: 10.0000 mg | ORAL_TABLET | Freq: Every day | ORAL | Status: DC

## 2013-06-02 MED ORDER — CLOPIDOGREL BISULFATE 75 MG PO TABS: 75.0000 mg | ORAL_TABLET | Freq: Every day | ORAL | Status: DC

## 2013-06-02 MED ORDER — AMLODIPINE BESYLATE 10 MG PO TABS: 10.0000 mg | ORAL_TABLET | Freq: Every day | ORAL | Status: DC

## 2013-06-02 MED ORDER — DIVALPROEX SODIUM ER 500 MG PO TB24: 500.0000 mg | ORAL_TABLET | Freq: Every day | ORAL | Status: DC

## 2013-06-02 NOTE — Progress Notes (Signed)
Stroke Team Progress Note  HISTORY Curtis Lam is an 52 y.o. 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 The patient states he flexed his neck at which time he had acute onset of vertical vertigo. He walked out and threw up over his porch rail. When he turned around he noted his left leg was clumsy and his mother noted he was very ataxic. The entire episode lasted approximately 10 minutes but his wife tells me he has been having on and off episodes of vertigo for the past month. None of his past episodes have bees as severe. He does suffer from intermittent pain that starts in the occipital portion of his head on the right and will traverse up and around his ear. HE has been told these are migraines. For these he has been taking up to 20 tablets per week of ibuprofen. He denies any period of diplopia, dysarthria, or dysphagia. MRI of the brain showed an acute infarct in the left cerebellum while at [facility] hospital and recent CT head was negative.   Patient was not a TPA candidate secondary to delay in arrival. He was admitted for further evaluation and treatment.  SUBJECTIVE No family is at the bedside.  Overall he feels his condition is gradually improving. Reports he "flexes" his neck 3-4 times per day - does not POP his neck per him.  OBJECTIVE Most recent Vital Signs: Filed Vitals:   06/02/13 0125 06/02/13 0515 06/02/13 0955 06/02/13 1219  BP: 149/90 147/97 141/96 159/89  Pulse: 64 63 69 80  Temp: 97.5 F (36.4 C) 97.6 F (36.4 C) 97.7 F (36.5 C)   TempSrc: Oral Oral Oral   Resp: 16 18 18 18   Height:      Weight:      SpO2: 99% 96% 97% 98%   CBG (last 3)   Recent Labs  06/01/13 2110 06/02/13 0639 06/02/13 1134  GLUCAP 85 91 143*    IV Fluid Intake:    MEDICATIONS   PRN:      Diet:    thin liquids Activity:  As tolerated DVT Prophylaxis:  Lovenox 40 mg  sq daily   CLINICALLY SIGNIFICANT STUDIES Basic Metabolic Panel:   Recent Labs Lab 05/31/13 0920 06/02/13 0607  NA 138 139  K 4.4 4.2  CL 104 102  CO2 26 27  GLUCOSE 116* 107*  BUN 20 13  CREATININE 0.68 0.80  CALCIUM 9.2 9.1   Liver Function Tests:   Recent Labs Lab 05/31/13 0920 06/02/13 0607  AST 29 24  ALT 38 37  ALKPHOS 55 58  BILITOT 0.3 0.6  PROT 7.5 7.1  ALBUMIN 4.1 4.0   CBC:   Recent Labs Lab 05/31/13 0920  WBC 4.9  NEUTROABS 3.1  HGB 16.3  HCT 47.6  MCV 89.8  PLT 159   Coagulation:   Recent Labs Lab 05/31/13 0920  LABPROT 12.9  INR 0.98   Cardiac Enzymes:   Recent Labs Lab 05/31/13 0920 05/31/13 2243  TROPONINI <0.30 <0.30   Urinalysis: No results found for this basename: COLORURINE, APPERANCEUR, LABSPEC, PHURINE, GLUCOSEU, HGBUR, BILIRUBINUR, KETONESUR, PROTEINUR, UROBILINOGEN, NITRITE, LEUKOCYTESUR,  in the last 168 hours Lipid Panel    Component Value Date/Time   CHOL 192 06/01/2013 0545   TRIG 269* 06/01/2013 0545   HDL 33* 06/01/2013 0545   CHOLHDL 5.8 06/01/2013 0545   VLDL 54* 06/01/2013  0545   LDLCALC 105* 06/01/2013 0545   HgbA1C  Lab Results  Component Value Date   HGBA1C 5.5 06/01/2013    Urine Drug Screen:   No results found for this basename: labopia,  cocainscrnur,  labbenz,  amphetmu,  thcu,  labbarb    Alcohol Level: No results found for this basename: ETH,  in the last 168 hours  CT of the brain  05/31/2013    1.  No acute intracranial findings. 2.  No intracranial hemorrhage.  3 .  Small infarct seen on comparison MRI is not visible by CT.  MRI of the brain  05/31/2013    No flow demonstrated in the left vertebral artery.  The age of this is unknown but this could be an acute development.  Diffusion imaging s uggests a punctate acute infarction in the lateral left cerebellar hemisphere.  MRA of the brain    2D Echocardiogram  EF 55%. Wall motion normal. LA mildly dilated.  Carotid Doppler    CXR  05/31/2013    1.  11 mm nodule in the left lung.  This appears to be within the lower lobe.  Recommend comparison with prior chest radiographs.  If no films are available, CT of the chest with contrast would be useful for further evaluation of this lesion. 2.  No acute cardiopulmonary disease.  EKG     Therapy Recommendations none  Physical Exam   Pleasant young Caucasian male not in distress.Awake alert. Afebrile. Head is nontraumatic. Neck is supple without bruit. Hearing is normal. Cardiac exam no murmur or gallop. Lungs are clear to auscultation. Distal pulses are well felt. Neurological Exam :  Awake  Alert oriented x 3. Normal speech and language.eye movements full without nystagmus.fundi were not visualized. Vision acuity and fields appear normal. Hearing is normal. Palatal movements are normal. Face symmetric. Tongue midline. Normal strength, tone, reflexes and coordination. Normal sensation. Gait cautious but steady.  ASSESSMENT Curtis Lam is a 52 y.o. male presenting with acute onset of dizziness/vertigo followed by nausea after flexing his neck. 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 Need further eval to look at [facility] for its possible correlation to stroke.  On no antithrombotics prior to admission. Now on aspirin 325 mg orally every day for secondary stroke prevention. Patient with no resultant neuro deficits. Work up underway.  Hyperlipidemia, LDL 105, on no statins PTA, now on lipitor, goal LDL < 100   Chronic daily migraine headaches for years, less now in the past, takes approx 20 ibuprofen per week. Has not been evaluated recently - has taken imitrex in the past.  Left lung 11mm nodule  Hospital day # 2  TREATMENT/PLAN  Plan change to baby aspirin 81mg  plus plavix 75mg  daily for secondary stroke prevention for 3 mos then one alone.  Stop ibuprofen. May lead to rebound headaches  Depakote ER 500 mg daily to help  with headache prevention  Stroke Service will sign off. Follow up with Dr. Pearlean Brownie in 2 months. At that time, plan to restudy, then change regimen to above.  Guy Franco, PAC,  MBA, MHA [facility] Pager: [phone] 06/02/2013 7:21 PM  I have personally obtained a history, examined the patient, evaluated imaging results, and formulated the assessment and plan of care. I agree with the above.    Delia Heady, MD

## 2013-06-02 NOTE — Progress Notes (Signed)
AVS and d/c instruction given to pt and family. Reminded to call to follow up with stroke Md and PCP . Pt verbalized good understanding saline lock d/c intact no c/o of pain at present. Awaiting for pick up condition is stable at d/c. Azzie Roup Rn

## 2013-06-02 NOTE — Discharge Summary (Signed)
Physician Discharge Summary  Curtis Lam MRN: [id number] DOB/AGE: February 04, 1961 52 y.o.  PCP: Carollee Herter, MD   Admit date: 05/31/2013 Discharge date: 06/02/2013  Discharge Diagnoses:   Left vertebral artery dissection Hypertension   Acute ischemic stroke   Hypertriglyceridemia     Medication List    STOP taking these medications       ibuprofen 200 MG tablet  Commonly known as:  ADVIL,MOTRIN      TAKE these medications       aspirin 81 MG chewable tablet  Chew 1 tablet (81 mg total) by mouth daily.     atorvastatin 10 MG tablet  Commonly known as:  LIPITOR  Take 1 tablet (10 mg total) by mouth daily at 6 PM.     cetirizine 10 MG tablet  Commonly known as:  ZYRTEC  Take 10 mg by mouth every evening.     clopidogrel 75 MG tablet  Commonly known as:  PLAVIX  Take 1 tablet (75 mg total) by mouth daily with breakfast.     divalproex 500 MG 24 hr tablet  Commonly known as:  DEPAKOTE ER  Take 1 tablet (500 mg total) by mouth daily.        Discharge Condition: Stable Disposition: Final discharge disposition not confirmed   Consults: Neurology  Significant Diagnostic Studies: Ct Angio Head W/cm &/or Wo Cm  06/01/2013   *RADIOLOGY REPORT*  Clinical Data:  52 year old male with left vertebral artery dissection/occlusion.  Dizziness and vertigo beginning at 0630 hours. Punctate left cerebellar infarct.  CT ANGIOGRAPHY HEAD AND NECK  Technique:  Multidetector CT imaging of the head and neck was performed using the standard protocol during bolus administration of intravenous contrast.  Multiplanar CT image reconstructions including MIPs were obtained to evaluate the vascular anatomy. Carotid stenosis measurements (when applicable) are obtained utilizing NASCET criteria, using the distal internal carotid diameter as the denominator.  Contrast: 50mL OMNIPAQUE IOHEXOL 350 MG/ML SOLN  Comparison:  Brain MRI 05/31/2013. CT head without contrast 05/31/2013.  CTA  NECK  Findings:  Negative lung apices.  No superior mediastinal lymphadenopathy.  Negative thyroid, larynx, pharynx, parapharyngeal spaces, retropharyngeal space, sublingual space, submandibular glands, parotid glands, and orbit soft tissues. Visualized paranasal sinuses and mastoids are clear.  No acute osseous abnormality identified.  Chronic cervical disc and endplate degeneration at C5-C6 and C6-C7.  No cervical lymphadenopathy.  Vascular Findings: Three-vessel arch configuration.  Minimal arch atherosclerosis.  Widely patent great vessel origins.  Normal right CCA origin.  Mild soft and calcified plaque at the right carotid bifurcation.  Soft plaque primarily responsible for mild narrowing of the proximal right ICA (series 4 image 69) but less than 50 % stenosis with respect to the distal vessel.  No proximal right subclavian artery stenosis.  Calcified plaque at the right vertebral artery origin without significant stenosis. Otherwise normal cervical right vertebral artery to the skull base.  Normal left CCA origin.  Normal left carotid bifurcation.  Normal left ICA origin.  Minimal irregularity of the bulb may reflect atherosclerosis.  Otherwise negative cervical left ICA.  No proximal left subclavian artery stenosis.  There is only mild irregularity of the left vertebral artery origin not felt related to a dissection, but instead may be due to mild atherosclerosis. The proximal left V1 segment enhances only mildly less than the right vertebral artery. However, at the C7 level there is abrupt decreased enhancement of the vessel and an irregular appearance of the lumen (series 4 image 46).  Thin slice images also suggest the possibility of focal more proximal low density filling defect in the V1 segment at the tracheoesophageal groove (series 16109 image 66). By the C6 level, there is poor enhancement of the left vertebral artery (series 4 image 51), and no enhancement of the vessel by the C2-C3 level. The  left vertebral artery looks fully thrombosed at the C1 - skull base level. In the intermediately enhancing segment there is irregularity of the lumen. See intracranial findings below.   Review of the MIP images confirms the above findings.  IMPRESSION: 1.  Gradual occlusion of the left vertebral artery in the neck, favor related to a dissection, with V1 and V2 segment atherosclerosis and hemodynamically significant stenosis are less likely possibility.  The vessel appears fully occluded at C2 - skull base.  See intracranial findings below. 2.  Minor left carotid and right vertebral artery origin atherosclerosis without hemodynamically significant stenosis. 3.  Mild right carotid bifurcation atherosclerosis.  No hemodynamically significant stenosis of the cervical right ICA.  CTA HEAD  Findings:  Stable gray-white matter differentiation in the posterior fossa.  No cerebellar cytotoxic edema identified.  Stable supratentorial gray-white matter differentiation. No midline shift, mass effect, or evidence of mass lesion.  No ventriculomegaly. No acute intracranial hemorrhage identified.  Incidental choroid plexus cysts. No abnormal enhancement identified.  Interestingly, on the delayed postcontrast images there is some enhancement of the left vertebral artery at the skull base, this could be retrograde.  Vascular Findings: Major intracranial venous structures appear to be enhancing.  The distal right vertebral artery is patent and within normal limits.  Normal right PICA vessel.  Patent vertebrobasilar junction.  There is enhancement of the left V4 segment to the level of the left PICA origin which is patent.  No enhancement proximal to the left PICA.  AICA origins are enhancing.  No basilar artery stenosis.  SCA and PCA origins are within normal limits.  There is mild to moderate stenosis at the right vertebrobasilar junction (series 60454 image 22).  Diminutive posterior communicating arteries.  Bilateral PCA branches  are within normal limits.  Calcified atherosclerosis of both ICA siphons in the vertical petrous and cavernous segments. No subsequent hemodynamically significant stenosis.  Bilateral ophthalmic and posterior communicating artery origins are normal.  Both supraclinoid ICA segments are patent.  The left A1 segment is diminutive or absent.  Normal right ICA terminus, right MCA and ACA origins.  Anterior communicating artery within normal limits. Bilateral ACA branches within normal limits.  Bilateral MCA M1 segments are normal.  Bilateral MCA branches are within normal limits.   Review of the MIP images confirms the above findings.  IMPRESSION: 1.  Retrograde reconstituted flow in the distal left vertebral artery, at least to the left PICA origin (left PICA is patent) and possibly to more of the distal vertebral artery (as suggested by delayed postcontrast images). 2.  The distal right vertebral artery is normal except for a moderate stenosis at the vertebrobasilar junction. 3.  Otherwise negative posterior circulation. 4.  Negative anterior circulation except for calcified ICA siphon atherosclerosis which is not hemodynamically significant. 5.  Stable CT appearance of the brain, no CT evidence of the questioned left cerebellar lacunar infarct on MRI.  Salient findings discussed by telephone with provider Annie Main on 06/01/2013 at 1515 hours.   Original Report Authenticated By: Erskine Speed, M.D.   Dg Chest 2 View  05/31/2013   *RADIOLOGY REPORT*  Clinical Data: Stroke.  CHEST - 2  VIEW  Comparison: None.  Findings: The heart size is normal.  An 11 mm nodular density is evident in the left lung.  This appears to be posterior on the lateral view.  The lungs are otherwise clear.  Mild degenerative changes are noted in the thoracic spine.  The visualized soft tissues and bony thorax are otherwise unremarkable.  IMPRESSION:  1.  11 mm nodule in the left lung.  This appears to be within the lower lobe.  Recommend  comparison with prior chest radiographs.  If no films are available, CT of the chest with contrast would be useful for further evaluation of this lesion. 2.  No acute cardiopulmonary disease.   Original Report Authenticated By: Marin Roberts, M.D.   Ct Head Wo Contrast  05/31/2013   *RADIOLOGY REPORT*  Clinical Data: Headache following TPA.  CT HEAD WITHOUT CONTRAST  Technique:  Contiguous axial images were obtained from the base of the skull through the vertex without contrast.  Comparison: MRI 05/31/2013  Findings: No acute intracranial hemorrhage.  No focal mass lesion. No CT evidence of acute infarction.   No midline shift or mass effect.  No hydrocephalus.  Basilar cisterns are patent.  Paranasal sinuses and mastoid air cells are clear.  Orbits are normal.  IMPRESSION:  1.  No acute intracranial findings. 2.  No intracranial hemorrhage.  3 .  Small infarct seen on comparison MRI is not visible by CT.   Original Report Authenticated By: Genevive Bi, M.D.   Ct Angio Neck W/cm &/or Wo/cm  06/01/2013   *RADIOLOGY REPORT*  Clinical Data:  52 year old male with left vertebral artery dissection/occlusion.  Dizziness and vertigo beginning at 0630 hours. Punctate left cerebellar infarct.  CT ANGIOGRAPHY HEAD AND NECK  Technique:  Multidetector CT imaging of the head and neck was performed using the standard protocol during bolus administration of intravenous contrast.  Multiplanar CT image reconstructions including MIPs were obtained to evaluate the vascular anatomy. Carotid stenosis measurements (when applicable) are obtained utilizing NASCET criteria, using the distal internal carotid diameter as the denominator.  Contrast: 50mL OMNIPAQUE IOHEXOL 350 MG/ML SOLN  Comparison:  Brain MRI 05/31/2013. CT head without contrast 05/31/2013.  CTA NECK  Findings:  Negative lung apices.  No superior mediastinal lymphadenopathy.  Negative thyroid, larynx, pharynx, parapharyngeal spaces, retropharyngeal space,  sublingual space, submandibular glands, parotid glands, and orbit soft tissues. Visualized paranasal sinuses and mastoids are clear.  No acute osseous abnormality identified.  Chronic cervical disc and endplate degeneration at C5-C6 and C6-C7.  No cervical lymphadenopathy.  Vascular Findings: Three-vessel arch configuration.  Minimal arch atherosclerosis.  Widely patent great vessel origins.  Normal right CCA origin.  Mild soft and calcified plaque at the right carotid bifurcation.  Soft plaque primarily responsible for mild narrowing of the proximal right ICA (series 4 image 69) but less than 50 % stenosis with respect to the distal vessel.  No proximal right subclavian artery stenosis.  Calcified plaque at the right vertebral artery origin without significant stenosis. Otherwise normal cervical right vertebral artery to the skull base.  Normal left CCA origin.  Normal left carotid bifurcation.  Normal left ICA origin.  Minimal irregularity of the bulb may reflect atherosclerosis.  Otherwise negative cervical left ICA.  No proximal left subclavian artery stenosis.  There is only mild irregularity of the left vertebral artery origin not felt related to a dissection, but instead may be due to mild atherosclerosis. The proximal left V1 segment enhances only mildly less than the right  vertebral artery. However, at the C7 level there is abrupt decreased enhancement of the vessel and an irregular appearance of the lumen (series 4 image 46).  Thin slice images also suggest the possibility of focal more proximal low density filling defect in the V1 segment at the tracheoesophageal groove (series 29562 image 66). By the C6 level, there is poor enhancement of the left vertebral artery (series 4 image 51), and no enhancement of the vessel by the C2-C3 level. The left vertebral artery looks fully thrombosed at the C1 - skull base level. In the intermediately enhancing segment there is irregularity of the lumen. See  intracranial findings below.   Review of the MIP images confirms the above findings.  IMPRESSION: 1.  Gradual occlusion of the left vertebral artery in the neck, favor related to a dissection, with V1 and V2 segment atherosclerosis and hemodynamically significant stenosis are less likely possibility.  The vessel appears fully occluded at C2 - skull base.  See intracranial findings below. 2.  Minor left carotid and right vertebral artery origin atherosclerosis without hemodynamically significant stenosis. 3.  Mild right carotid bifurcation atherosclerosis.  No hemodynamically significant stenosis of the cervical right ICA.  CTA HEAD  Findings:  Stable gray-white matter differentiation in the posterior fossa.  No cerebellar cytotoxic edema identified.  Stable supratentorial gray-white matter differentiation. No midline shift, mass effect, or evidence of mass lesion.  No ventriculomegaly. No acute intracranial hemorrhage identified.  Incidental choroid plexus cysts. No abnormal enhancement identified.  Interestingly, on the delayed postcontrast images there is some enhancement of the left vertebral artery at the skull base, this could be retrograde.  Vascular Findings: Major intracranial venous structures appear to be enhancing.  The distal right vertebral artery is patent and within normal limits.  Normal right PICA vessel.  Patent vertebrobasilar junction.  There is enhancement of the left V4 segment to the level of the left PICA origin which is patent.  No enhancement proximal to the left PICA.  AICA origins are enhancing.  No basilar artery stenosis.  SCA and PCA origins are within normal limits.  There is mild to moderate stenosis at the right vertebrobasilar junction (series 13086 image 22).  Diminutive posterior communicating arteries.  Bilateral PCA branches are within normal limits.  Calcified atherosclerosis of both ICA siphons in the vertical petrous and cavernous segments. No subsequent hemodynamically  significant stenosis.  Bilateral ophthalmic and posterior communicating artery origins are normal.  Both supraclinoid ICA segments are patent.  The left A1 segment is diminutive or absent.  Normal right ICA terminus, right MCA and ACA origins.  Anterior communicating artery within normal limits. Bilateral ACA branches within normal limits.  Bilateral MCA M1 segments are normal.  Bilateral MCA branches are within normal limits.   Review of the MIP images confirms the above findings.  IMPRESSION: 1.  Retrograde reconstituted flow in the distal left vertebral artery, at least to the left PICA origin (left PICA is patent) and possibly to more of the distal vertebral artery (as suggested by delayed postcontrast images). 2.  The distal right vertebral artery is normal except for a moderate stenosis at the vertebrobasilar junction. 3.  Otherwise negative posterior circulation. 4.  Negative anterior circulation except for calcified ICA siphon atherosclerosis which is not hemodynamically significant. 5.  Stable CT appearance of the brain, no CT evidence of the questioned left cerebellar lacunar infarct on MRI.  Salient findings discussed by telephone with provider Annie Main on 06/01/2013 at 1515 hours.   Original  Report Authenticated By: Erskine Speed, M.D.   Mr Brain Wo Contrast  05/31/2013   *RADIOLOGY REPORT*  Clinical Data: Acute vertigo.  Headache.  MRI HEAD WITHOUT CONTRAST  Technique:  Multiplanar, multiecho pulse sequences of the brain and surrounding structures were obtained according to standard protocol without intravenous contrast.  Comparison: Head CT 01/12/2007  Findings: Diffusion imaging suggests a punctate acute infarction in the lateral left cerebellum.  No other acute finding.  The brainstem is normal.  The cerebral hemispheres are normal.  No mass lesion, hemorrhage, hydrocephalus or extra-axial collection.  There is no flow in the left vertebral artery.  The right vertebral arteries patent to the  basilar.  Both carotids show flow.  No pituitary mass.  No fluid in the sinuses, middle ears or mastoids.  IMPRESSION: No flow demonstrated in the left vertebral artery.  The age of this is unknown but this could be an acute development.  Diffusion imaging suggests a punctate acute infarction in the lateral left cerebellar hemisphere.   Original Report Authenticated By: Paulina Fusi, M.D.     2-D echo LV EF: 55% - 60%  ------------------------------------------------------------ Indications: CVA 436.  ------------------------------------------------------------ History: Risk factors: Hypertriglyeridemia. Depression. Migraine. Hip Pain. Hypertension. Dyslipidemia.  ------------------------------------------------------------ Study Conclusions  - Left ventricle: The cavity size was normal. Wall thickness was increased in a pattern of mild LVH. Systolic function was normal. The estimated ejection fraction was in the range of 55% to 60%. Wall motion was normal; there were no regional wall motion abnormalities. Features are consistent with a pseudonormal left ventricular filling pattern, with concomitant abnormal relaxation and increased filling pressure (grade 2 diastolic dysfunction). - Aortic valve: There was no stenosis. - Mitral valve: No significant regurgitation. - Left atrium: The atrium was mildly dilated. - Right ventricle: The cavity size was normal. Systolic function was normal. - Tricuspid valve: Peak RV-RA gradient: 21mm Hg (S). - Pulmonary arteries: PA peak pressure: 26mm Hg (S). - Inferior vena cava: The vessel was normal in size; the respirophasic diameter changes were in the normal range (= 50%); findings are consistent with normal central venous pressure. Impressions:  - Normal LV size with mild LV hypertrophy, EF 55-60%. Moderate diastolic dysfunction. Normal RV size and systolic function. No significant valvular dysfunction.    Microbiology: No results  found for this or any previous visit (from the past 240 hour(s)).   Labs: Results for orders placed during the hospital encounter of 05/31/13 (from the past 48 hour(s))  CBC WITH DIFFERENTIAL     Status: None   Collection Time    05/31/13  9:20 AM      Result Value Range   WBC 4.9  4.0 - 10.5 K/uL   RBC 5.30  4.22 - 5.81 MIL/uL   Hemoglobin 16.3  13.0 - 17.0 g/dL   HCT 16.1  09.6 - 04.5 %   MCV 89.8  78.0 - 100.0 fL   MCH 30.8  26.0 - 34.0 pg   MCHC 34.2  30.0 - 36.0 g/dL   RDW 40.9  81.1 - 91.4 %   Platelets 159  150 - 400 K/uL   Neutrophils Relative % 62  43 - 77 %   Neutro Abs 3.1  1.7 - 7.7 K/uL   Lymphocytes Relative 26  12 - 46 %   Lymphs Abs 1.3  0.7 - 4.0 K/uL   Monocytes Relative 8  3 - 12 %   Monocytes Absolute 0.4  0.1 - 1.0 K/uL   Eosinophils Relative  3  0 - 5 %   Eosinophils Absolute 0.2  0.0 - 0.7 K/uL   Basophils Relative 1  0 - 1 %   Basophils Absolute 0.0  0.0 - 0.1 K/uL  COMPREHENSIVE METABOLIC PANEL     Status: Abnormal   Collection Time    05/31/13  9:20 AM      Result Value Range   Sodium 138  135 - 145 mEq/L   Potassium 4.4  3.5 - 5.1 mEq/L   Comment: HEMOLYSIS AT THIS LEVEL MAY AFFECT RESULT   Chloride 104  96 - 112 mEq/L   CO2 26  19 - 32 mEq/L   Glucose, Bld 116 (*) 70 - 99 mg/dL   BUN 20  6 - 23 mg/dL   Creatinine, Ser 4.01  0.50 - 1.35 mg/dL   Calcium 9.2  8.4 - 02.7 mg/dL   Total Protein 7.5  6.0 - 8.3 g/dL   Albumin 4.1  3.5 - 5.2 g/dL   AST 29  0 - 37 U/L   ALT 38  0 - 53 U/L   Alkaline Phosphatase 55  39 - 117 U/L   Total Bilirubin 0.3  0.3 - 1.2 mg/dL   GFR calc non Af Amer >90  >90 mL/min   GFR calc Af Amer >90  >90 mL/min   Comment:            The eGFR has been calculated     using the CKD EPI equation.     This calculation has not been     validated in all clinical     situations.     eGFR's persistently     <90 mL/min signify     possible Chronic Kidney Disease.  TROPONIN I     Status: None   Collection Time    05/31/13   9:20 AM      Result Value Range   Troponin I <0.30  <0.30 ng/mL   Comment:            Due to the release kinetics of cTnI,     a negative result within the first hours     of the onset of symptoms does not rule out     myocardial infarction with certainty.     If myocardial infarction is still suspected,     repeat the test at appropriate intervals.  PROTIME-INR     Status: None   Collection Time    05/31/13  9:20 AM      Result Value Range   Prothrombin Time 12.9  11.6 - 15.2 seconds   INR 0.98  0.00 - 1.49  APTT     Status: None   Collection Time    05/31/13  9:20 AM      Result Value Range   aPTT 25  24 - 37 seconds  GLUCOSE, CAPILLARY     Status: None   Collection Time    05/31/13  4:34 PM      Result Value Range   Glucose-Capillary 91  70 - 99 mg/dL   Comment 1 Documented in Chart     Comment 2 Notify RN    GLUCOSE, CAPILLARY     Status: None   Collection Time    05/31/13  9:48 PM      Result Value Range   Glucose-Capillary 96  70 - 99 mg/dL  TROPONIN I     Status: None   Collection Time    05/31/13 10:43 PM  Result Value Range   Troponin I <0.30  <0.30 ng/mL   Comment:            Due to the release kinetics of cTnI,     a negative result within the first hours     of the onset of symptoms does not rule out     myocardial infarction with certainty.     If myocardial infarction is still suspected,     repeat the test at appropriate intervals.  HEMOGLOBIN A1C     Status: None   Collection Time    06/01/13  5:45 AM      Result Value Range   Hemoglobin A1C 5.5  <5.7 %   Comment: (NOTE)                                                                               According to the ADA Clinical Practice Recommendations for 2011, when     HbA1c is used as a screening test:      >=6.5%   Diagnostic of Diabetes Mellitus               (if abnormal result is confirmed)     5.7-6.4%   Increased risk of developing Diabetes Mellitus     References:Diagnosis and  Classification of Diabetes Mellitus,Diabetes     Care,2011,34(Suppl 1):S62-S69 and Standards of Medical Care in             Diabetes - 2011,Diabetes Care,2011,34 (Suppl 1):S11-S61.   Mean Plasma Glucose 111  <117 mg/dL  LIPID PANEL     Status: Abnormal   Collection Time    06/01/13  5:45 AM      Result Value Range   Cholesterol 192  0 - 200 mg/dL   Triglycerides 161 (*) <150 mg/dL   HDL 33 (*) >09 mg/dL   Total CHOL/HDL Ratio 5.8     VLDL 54 (*) 0 - 40 mg/dL   LDL Cholesterol 604 (*) 0 - 99 mg/dL   Comment:            Total Cholesterol/HDL:CHD Risk     Coronary Heart Disease Risk Table                         Men   Women      1/2 Average Risk   3.4   3.3      Average Risk       5.0   4.4      2 X Average Risk   9.6   7.1      3 X Average Risk  23.4   11.0                Use the calculated Patient Ratio     above and the CHD Risk Table     to determine the patient's CHD Risk.                ATP III CLASSIFICATION (LDL):      <100     mg/dL   Optimal      540-981  mg/dL   Near or Above  Optimal      130-159  mg/dL   Borderline      119-147  mg/dL   High      >829     mg/dL   Very High  GLUCOSE, CAPILLARY     Status: Abnormal   Collection Time    06/01/13  6:49 AM      Result Value Range   Glucose-Capillary 102 (*) 70 - 99 mg/dL  GLUCOSE, CAPILLARY     Status: None   Collection Time    06/01/13 11:31 AM      Result Value Range   Glucose-Capillary 84  70 - 99 mg/dL  GLUCOSE, CAPILLARY     Status: Abnormal   Collection Time    06/01/13  4:29 PM      Result Value Range   Glucose-Capillary 101 (*) 70 - 99 mg/dL  GLUCOSE, CAPILLARY     Status: None   Collection Time    06/01/13  9:10 PM      Result Value Range   Glucose-Capillary 85  70 - 99 mg/dL  COMPREHENSIVE METABOLIC PANEL     Status: Abnormal   Collection Time    06/02/13  6:07 AM      Result Value Range   Sodium 139  135 - 145 mEq/L   Potassium 4.2  3.5 - 5.1 mEq/L   Chloride 102  96 -  112 mEq/L   CO2 27  19 - 32 mEq/L   Glucose, Bld 107 (*) 70 - 99 mg/dL   BUN 13  6 - 23 mg/dL   Creatinine, Ser 5.62  0.50 - 1.35 mg/dL   Calcium 9.1  8.4 - 13.0 mg/dL   Total Protein 7.1  6.0 - 8.3 g/dL   Albumin 4.0  3.5 - 5.2 g/dL   AST 24  0 - 37 U/L   ALT 37  0 - 53 U/L   Alkaline Phosphatase 58  39 - 117 U/L   Total Bilirubin 0.6  0.3 - 1.2 mg/dL   GFR calc non Af Amer >90  >90 mL/min   GFR calc Af Amer >90  >90 mL/min   Comment:            The eGFR has been calculated     using the CKD EPI equation.     This calculation has not been     validated in all clinical     situations.     eGFR's persistently     <90 mL/min signify     possible Chronic Kidney Disease.  GLUCOSE, CAPILLARY     Status: None   Collection Time    06/02/13  6:39 AM      Result Value Range   Glucose-Capillary 91  70 - 99 mg/dL     HPI : 52 y.o. 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 The patient states he flexed his neck at which time he had acute onset of vertical vertigo. He walked out and threw up over his porch rail. When he turned around he noted his left leg was clumsy and his mother noted he was very ataxic. The entire episode lasted approximately 10 minutes but his wife tells me he has been having on and off episodes of vertigo for the past month. None of his past episodes have bees as severe. He does suffer from intermittent pain that starts  in the occipital portion of his head on the right and will traverse up and around his ear. HE has been told these are migraines. For these he has been taking up to 20 tablets per week of ibuprofen. He denies any period of diplopia, dysarthria, or dysphagia. MRI of the brain showed an acute infarct in the left cerebellum while at [facility] hospital and recent CT head was negative.   HOSPITAL COURSE:   Vertebral artery dissection Neurology  recommended aspirin 81 mg and Plavix 75 mg daily for 3 months Other workup included CT of the brain 05/31/2013 1. No acute intracranial findings. 2. No intracranial hemorrhage. 3 . Small infarct seen on comparison MRI is not visible by CT.  MRI of the brain 05/31/2013 No flow demonstrated in the left vertebral artery. The age of this is unknown but this could be an acute development. Diffusion imaging s uggests a punctate acute infarction in the lateral left cerebellar hemisphere.  MRA of the brain  2D Echocardiogram  Carotid Doppler  CXR 05/31/2013 1. 11 mm nodule in the left lung. This appears to be within the lower lobe. Recommend comparison with prior chest radiographs. If no films are available, CT of the chest with contrast would be useful for further evaluation of this lesion. 2. No acute cardiopulmonary disease.  Hyperlipidemia, LDL 105, on no statins PTA, now on lipitor, goal LDL < 100     History of migraine headaches Chronic daily migraine headaches for years, less now in the past, takes approx 20 ibuprofen per week. Has not been evaluated recently - has taken imitrex in the past Depakote ER 500 mg daily to help with headache prevention   Left lung 11mm nodule Needs close followup by primary  Hypertension We'll start the patient on Norvasc   Discharge Exam: Blood pressure 147/97, pulse 63, temperature 97.6 F (36.4 C), temperature source Oral, resp. rate 18, height 5\' 7"  (1.702 m), weight 88.27 kg (194 lb 9.6 oz), SpO2 96.00%.  General: A&O x 3, NAD, nontoxic, pleasant/cooperative  Head/Eye: No conjunctival hemorrhage, no icterus, [place]/AT, No nystagmus  ENT: No icterus, No thrush, good dentition, no pharyngeal exudate  Neck: No masses, no lymphadenpathy, no bruits  CV: RRR, no rub, no gallop, no S3  Lung: CTAB, good air movement, no wheeze, no rhonchi  Abdomen: soft/NT, +BS, nondistended, no peritoneal signs  Ext: No cyanosis, No rashes, No petechiae, No lymphangitis, No  edema  Neuro: CNII-XII intact, strength 4/5 in bilateral upper and lower extremities, no dysmetria         Future Appointments Provider Department Dept Phone   08/10/2013 8:00 AM Rollene Rotunda, MD [facility] Main Office [place]) [phone]        Signed: Richarda Overlie 06/02/2013, 7:57 AM

## 2013-06-02 NOTE — Progress Notes (Signed)
     Curtis Lam  MRN: [id number]  DOB/AGE: [birth year] 54 y.o.  PCP: Carollee Herter, MD    Patient admitted to the hospital for CVA   Patient can go back to work 06/07/13   Richarda Overlie

## 2013-06-02 NOTE — Care Management Note (Signed)
    Page 1 of 1   06/02/2013     12:56:29 PM   CARE MANAGEMENT NOTE 06/02/2013  Patient:  BRALON, ANTKOWIAK A   Account Number:  [id number]  Date Initiated:  06/02/2013  Documentation initiated by:  [facility]  Subjective/Objective Assessment:   admitted with CVA     Action/Plan:   PT/OT evals-no follow up recommended,no equipment needs identified   Anticipated DC Date:  06/02/2013   Anticipated DC Plan:  HOME/SELF CARE      DC Planning Services  CM consult      Choice offered to / List presented to:             Status of service:  Completed, signed off Medicare Important Message given?   (If response is "NO", the following Medicare IM given date fields will be blank) Date Medicare IM given:   Date Additional Medicare IM given:    Discharge Disposition:  HOME/SELF CARE  Per UR Regulation:  Reviewed for med. necessity/level of care/duration of stay  If discussed at Long Length of Stay Meetings, dates discussed:    Comments:

## 2013-06-07 ENCOUNTER — Encounter: Payer: Self-pay | Admitting: Family Medicine

## 2013-06-07 ENCOUNTER — Ambulatory Visit (INDEPENDENT_AMBULATORY_CARE_PROVIDER_SITE_OTHER): Payer: BC Managed Care – PPO | Admitting: Family Medicine

## 2013-06-07 VITALS — BP 132/82 | HR 88 | Ht 67.0 in | Wt 192.0 lb

## 2013-06-07 DIAGNOSIS — I6350 Cerebral infarction due to unspecified occlusion or stenosis of unspecified cerebral artery: Secondary | ICD-10-CM

## 2013-06-07 DIAGNOSIS — I10 Essential (primary) hypertension: Secondary | ICD-10-CM

## 2013-06-07 DIAGNOSIS — I639 Cerebral infarction, unspecified: Secondary | ICD-10-CM

## 2013-06-07 DIAGNOSIS — E782 Mixed hyperlipidemia: Secondary | ICD-10-CM

## 2013-06-07 DIAGNOSIS — Z79899 Other long term (current) drug therapy: Secondary | ICD-10-CM

## 2013-06-07 DIAGNOSIS — R7301 Impaired fasting glucose: Secondary | ICD-10-CM

## 2013-06-07 DIAGNOSIS — R918 Other nonspecific abnormal finding of lung field: Secondary | ICD-10-CM

## 2013-06-07 NOTE — Progress Notes (Signed)
Chief Complaint  Patient presents with  . Hospitalization Follow-up    hospital follow up from stroke. Still having HA's.   Last week was admitted with stroke and vertebral artery dissection--he had much more severe vertigo than was typical for him, associated with diaphoresis and vomiting.  He was started on Lipitor, amlodipine, Plavix, aspirin and divalproex during hospitalization.  The divalproex was added to help with his headaches (not for any seizures).  Denies any significant bruising (just from bloodwork) or bleeding.  BP's have been running 129-170/81-106, mostly 135-140/90 using home wrist monitor. Pulse 66-97, mostly 70's-80.  Feels a little "wooshy" or fuzzy-headed if he stands too quickly, lasts just momentarily. No recurrent vertigo.  Tylenol controls headaches for about 4 hours, taking it just prn.  Notices very mild unsteadiness re: balance, intermittently.  He had been having headaches for about a month prior to the stroke.  He reports feeling like there is a knot on the back of the left side of his head, which radiated up the back of the head, to the left eye.  Since the hospitalization headaches seem a little different, more of just an achey sensation, rather than severe radiating pain.  So, still having pain/headache, but somewhat different quality since being on the depakote to try and help with headaches.  Has f/u with Dr. Pearlean Brownie in about 2 months.  Per discharge summary: Vertebral artery dissection  Neurology recommended aspirin 81 mg and Plavix 75 mg daily for 3 months  Other workup included  CT of the brain 05/31/2013 1. No acute intracranial findings. 2. No intracranial hemorrhage. 3 . Small infarct seen on comparison MRI is not visible by CT.  MRI of the brain 05/31/2013 No flow demonstrated in the left vertebral artery. The age of this is unknown but this could be an acute development. Diffusion imaging s uggests a punctate acute infarction in the lateral left cerebellar  hemisphere.  MRA of the brain  2D Echocardiogram  - Normal LV size with mild LV hypertrophy, EF 55-60%. Moderate diastolic dysfunction. Normal RV size and systolic function. No significant valvular dysfunction.  Carotid Doppler  CXR 05/31/2013 1. 11 mm nodule in the left lung. This appears to be within the lower lobe. Recommend comparison with prior chest radiographs. If no films are available, CT of the chest with contrast would be useful for further evaluation of this lesion. 2. No acute cardiopulmonary disease.  Hyperlipidemia, LDL 105, on no statins PTA, now on lipitor, goal LDL < 100   History of migraine headaches  Chronic daily migraine headaches for years, less now in the past, takes approx 20 ibuprofen per week. Has not been evaluated recently - has taken imitrex in the past  Depakote ER 500 mg daily to help with headache prevention  Left lung 11mm nodule  Needs close followup by primary  Past Medical History  Diagnosis Date  . Depression   . Elevated cholesterol   . History of migraine headaches   . Rectal polyp    Past Surgical History  Procedure Laterality Date  . Vasectomy    . Tonsillectomy    . Colonoscopy  01/02/13   History   Social History  . Marital Status: Married    Spouse Name: N/A    Number of Children: N/A  . Years of Education: N/A   Occupational History  . Designer, industrial/product    Social History Main Topics  . Smoking status: Never Smoker   . Smokeless tobacco: Never Used  .  Alcohol Use: Yes     Comment: 1 beer or wine 2-3 times per month, more in summer than winter  . Drug Use: No  . Sexually Active: Yes -- Male partner(s)   Other Topics Concern  . Not on file   Social History Narrative   Lives with wife and son.  Other son lives in [place]   Current outpatient prescriptions:amLODipine (NORVASC) 10 MG tablet, Take 1 tablet (10 mg total) by mouth daily., Disp: 30 tablet, Rfl: 3;  aspirin 81 MG chewable tablet, Chew 1 tablet (81 mg total) by  mouth daily., Disp: 30 tablet, Rfl: 10;  atorvastatin (LIPITOR) 10 MG tablet, Take 1 tablet (10 mg total) by mouth daily at 6 PM., Disp: 30 tablet, Rfl: 4;  b complex vitamins tablet, Take 1 tablet by mouth daily., Disp: , Rfl:  cetirizine (ZYRTEC) 10 MG tablet, Take 10 mg by mouth every evening. , Disp: , Rfl: ;  clopidogrel (PLAVIX) 75 MG tablet, Take 1 tablet (75 mg total) by mouth daily with breakfast., Disp: 30 tablet, Rfl: 3;  divalproex (DEPAKOTE ER) 500 MG 24 hr tablet, Take 1 tablet (500 mg total) by mouth daily., Disp: 30 tablet, Rfl: 2;  multivitamin (ONE-A-DAY MEN'S) TABS, Take 1 tablet by mouth daily., Disp: , Rfl:   No Known Allergies  ROS:  Denies fevers, URI symptoms, cough, shortness of breath, chest pain, nausea, vomiting, bowel changes, bleeding/bruising.  Denies depression.  Headaches as per HPI.  No weakness, trouble walking or speaking.  PHYSICAL EXAM: BP 132/82  Pulse 88  Ht 5\' 7"  (1.702 m)  Wt 192 lb (87.091 kg)  BMI 30.06 kg/m2 140/74 on repeat by MD Pleasant male in no distress HEENT:  PERRL, conjunctiva clear, EOMI Neck: no lymphadenopathy, thyromegaly or carotid bruit Heart: regular rate and rhythm without murmur Lungs: clear bilaterally Abdomen: soft, nontender, no organomegaly or mass Extremities: 2+ pulses, no edema Skin: no rash or bruising Psych: normal mood ,affect, hygiene and grooming Neuro: alert and oriented.  Cranial nerves intact. 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  continue aspirin and plavix, and f/u with Dr. Pearlean Brownie as scheduled  Mixed hyperlipidemia - started on lipitor in hospital; schedule f/u.  tolerating without side effects - Plan: Lipid panel  HYPERTENSION - his monitor doesn't appear to be accurate.  diastolic much lower here, systolic borderline.  low sodium diet, monitor.  consider med changes if remains high  Encounter for long-term (current) use of other medications -  Plan: CBC with Differential, Lipid panel, Hepatic function panel  Impaired fasting glucose - Plan: Glucose, random  Pulmonary nodules - reschedule CT scan.  May need to get CD of results from [place] for radiology to compare (can check chart to see if we rec'd report, but they may want images)  F/u with neuro re: headaches if persistent/worsening (can f/u sooner than routine f/u scheduled appt for stroke).  HTN--borderline control.  Machine at home doesn't seem accurate (diastolics very high) Low sodium diet reviewed.  Hyperlipidemia, mixed--on lipitor.  Set up labs  CBC, LFT's and lipids, glucose 2 months  Check with Dr. Pearlean Brownie re: return to work and whether there are any restrictions.  I do not see reason for restrictions at this time.

## 2013-06-07 NOTE — Patient Instructions (Addendum)
Low sodium diet (see below).  Continue to monitor BP--?accuracy of your machine. Bring monitor to next visit.  You can also check at pharmacy.  Follow up sooner with neuro if your headaches are worsening.  We will get you scheduled for CT scan of the chest  Check with Dr. Pearlean Brownie re: return to work and whether there are any restrictions.  I do not see reason for restrictions at this time--verify with neurologist.

## 2013-06-08 ENCOUNTER — Encounter: Payer: Self-pay | Admitting: Neurology

## 2013-06-08 ENCOUNTER — Ambulatory Visit
Admission: RE | Admit: 2013-06-08 | Discharge: 2013-06-08 | Disposition: A | Discharge status: Home / Self Care | Payer: BC Managed Care – PPO | Source: Ambulatory Visit | Attending: Family Medicine | Admitting: Family Medicine

## 2013-06-08 ENCOUNTER — Telehealth: Payer: Self-pay | Admitting: *Deleted

## 2013-06-08 DIAGNOSIS — R918 Other nonspecific abnormal finding of lung field: Secondary | ICD-10-CM

## 2013-06-08 NOTE — Telephone Encounter (Signed)
I called patient. The patient has been doing well following discharge from the hospital on 05/31/2013. The patient had a left vertebral artery dissection, and a punctate cerebellar infarct. He is on aspirin and Plavix. The left vertebral artery is occluded. The patient wants to go back to work, he has a job that is relatively sedentary. No heavy lifting, stooping, or climbing. The patient is okay to return to work at this time. No deficits were noted following the hospitalization.

## 2013-06-08 NOTE — Telephone Encounter (Signed)
Pt calling asking about going back to work.  Saw pcp 06-07-13 and deferred to [place].   Dr. Pearlean Brownie out.   I spoke to pt and he is doipng well, has had few transient fuzzy spell (slight) since discharged.   Was under impression would get note when left hospital but did not have.  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  Meetings next week.  Please advise.  Mobile [phone].

## 2013-06-09 ENCOUNTER — Telehealth: Payer: Self-pay | Admitting: Family Medicine

## 2013-06-09 NOTE — Telephone Encounter (Signed)
Pt came by to obtain copy of last imaging report in Jan 2014

## 2013-06-09 NOTE — Progress Notes (Signed)
Waiting for outside images from [place] to compare w/ ct chest done on 06-08-13,

## 2013-06-19 ENCOUNTER — Encounter: Payer: Self-pay | Admitting: Family Medicine

## 2013-06-21 ENCOUNTER — Encounter: Payer: Self-pay | Admitting: Neurology

## 2013-06-30 ENCOUNTER — Encounter: Payer: Self-pay | Admitting: Family Medicine

## 2013-07-12 ENCOUNTER — Telehealth: Payer: Self-pay | Admitting: Neurology

## 2013-07-24 ENCOUNTER — Ambulatory Visit: Payer: Self-pay | Admitting: Neurology

## 2013-07-24 ENCOUNTER — Other Ambulatory Visit: Payer: Managed Care, Other (non HMO)

## 2013-07-24 DIAGNOSIS — R7301 Impaired fasting glucose: Secondary | ICD-10-CM

## 2013-07-24 DIAGNOSIS — Z79899 Other long term (current) drug therapy: Secondary | ICD-10-CM

## 2013-07-24 DIAGNOSIS — E782 Mixed hyperlipidemia: Secondary | ICD-10-CM

## 2013-07-24 LAB — CBC WITH DIFFERENTIAL/PLATELET
Lymphocytes Relative: 35 % (ref 12–46)
Lymphs Abs: 1.5 10*3/uL (ref 0.7–4.0)
Neutrophils Relative %: 50 % (ref 43–77)
Platelets: 177 10*3/uL (ref 150–400)
RBC: 4.98 MIL/uL (ref 4.22–5.81)
WBC: 4.2 10*3/uL (ref 4.0–10.5)

## 2013-07-24 NOTE — Addendum Note (Signed)
Addended by: Janeice Robinson on: 07/24/2013 09:22 AM   Modules accepted: Orders

## 2013-07-25 LAB — HEPATIC FUNCTION PANEL
ALT: 40 U/L (ref 0–53)
AST: 26 U/L (ref 0–37)
Albumin: 4.3 g/dL (ref 3.5–5.2)
Alkaline Phosphatase: 49 U/L (ref 39–117)
Bilirubin, Direct: 0.1 mg/dL (ref 0.0–0.3)
Indirect Bilirubin: 0.3 mg/dL (ref 0.0–0.9)
Total Bilirubin: 0.4 mg/dL (ref 0.3–1.2)
Total Protein: 6.5 g/dL (ref 6.0–8.3)

## 2013-07-25 LAB — GLUCOSE, RANDOM: Glucose, Bld: 107 mg/dL — ABNORMAL HIGH (ref 70–99)

## 2013-07-25 LAB — LIPID PANEL
Cholesterol: 131 mg/dL (ref 0–200)
HDL: 37 mg/dL — ABNORMAL LOW
LDL Cholesterol: 73 mg/dL (ref 0–99)
Total CHOL/HDL Ratio: 3.5 ratio
Triglycerides: 103 mg/dL
VLDL: 21 mg/dL (ref 0–40)

## 2013-08-02 ENCOUNTER — Encounter: Payer: Self-pay | Admitting: Neurology

## 2013-08-02 ENCOUNTER — Ambulatory Visit (INDEPENDENT_AMBULATORY_CARE_PROVIDER_SITE_OTHER): Payer: Managed Care, Other (non HMO) | Admitting: Neurology

## 2013-08-02 VITALS — BP 122/83 | HR 68 | Ht 67.0 in | Wt 194.0 lb

## 2013-08-02 DIAGNOSIS — I7774 Dissection of vertebral artery: Secondary | ICD-10-CM

## 2013-08-02 NOTE — Patient Instructions (Addendum)
Continue aspirin and Plavix for now and check followup CT angiogram in September and will discontinue aspirin at that time. Increase Depakote to 500 twice daily for migraine prevention. Schedule for Botox for migraine prevention if approved by insurance.

## 2013-08-02 NOTE — Progress Notes (Signed)
[facility] [address] [place]. [place] [address] [phone]       OFFICE FOLLOW-UP NOTE  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 He states he had flexed his neck at that time when the symptoms began. He walked out staggering and threw up over the porch rail. He noticed that his left leg was clumsy and mother noticed that he was staggering when walking. This lasted about 10 minutes and then he started having on and off transient episodes of vertigo. On inquiry he states that he has had this transient vertigo episodes for a month following an episode while working in the yard when he had jerked his neck while pulling on her IVP equipment. He had not sought any medical advice at that time. He did agree 2 intermittent pain in the occipital region of his head which would traverse up and around his ear. He does have long-standing history of migraine headaches which shows ears are become more frequent and he has been taking up to 20 tablets per week of ibuprofen. She was started by me on Depakote during the hospitalization and states that the headaches are somewhat better but he still has almost daily headaches and these can be disabling. He states his headaches are triggered by monos sodium glutamate and cheese. Headache fluctuates in severity from 4-5/10 to up to 10 out of 10. The headaches are accompanied by nausea and light and sound sensitivity. He has tried Inderal, amitriptyline for headache prophylaxis in the past without relief and stopped them due to side effects. His evaluation in the hospital showed a small left medullary infarct and CT angiogram revealed occlusion of the left vertebral artery in the neck likely due to dissection  in V1 and V2 segment. Transthoracic echo showed  normal ejection fraction. LDL was slightly elevated at 105. Hemoglobin A1c was normal triglycerides were elevated at 269. Patient started on aspirin and Plavix as well as Lipitor. States is tolerating these medications well with only minor bruising and no muscle aches or pains. He states he still has some intermittent feeling of imbalance particularly make sudden moves but has not had any further problems with staggering gait. He has noticed improvement in his headaches with the Depakote but they're still there almost daily.  ROS:   14 system review of systems is positive for  Atypical chestwall pain, swelling in the legs, hearing loss, ringing and spinning sensation, headache, joint pain PMH:  Past Medical History  Diagnosis Date  . Depression   . Elevated cholesterol   . History of migraine headaches   . Rectal polyp     Social History:  History   Social History  . Marital Status: Married    Spouse Name: N/A    Number of Children: N/A  . Years of Education: N/A   Occupational History  . Designer, industrial/product    Social History Main Topics  . Smoking status: Never Smoker   . Smokeless tobacco: Never Used  . Alcohol Use: Yes     Comment: 1 beer or wine 2-3 times per month, more in summer than winter  . Drug Use: No  . Sexual Activity: Yes    Partners: Female   Other Topics Concern  . Not on file   Social  History Narrative   Lives with wife and son.  Other son lives in [place]    Medications:   Current Outpatient Prescriptions on File Prior to Visit  Medication Sig Dispense Refill  . amLODipine (NORVASC) 10 MG tablet Take 1 tablet (10 mg total) by mouth daily.  30 tablet  3  . aspirin 81 MG chewable tablet Chew 1 tablet (81 mg total) by mouth daily.  30 tablet  10  . atorvastatin (LIPITOR) 10 MG tablet Take 1 tablet (10 mg total) by mouth daily at 6 PM.  30 tablet  4  . b complex vitamins tablet Take 1 tablet by mouth daily.      . cetirizine (ZYRTEC) 10 MG tablet Take 10 mg by  mouth every evening.       . clopidogrel (PLAVIX) 75 MG tablet Take 1 tablet (75 mg total) by mouth daily with breakfast.  30 tablet  3  . divalproex (DEPAKOTE ER) 500 MG 24 hr tablet Take 1 tablet (500 mg total) by mouth daily.  30 tablet  2  . multivitamin (ONE-A-DAY MEN'S) TABS Take 1 tablet by mouth daily.       No current facility-administered medications on file prior to visit.    Allergies:  No Known Allergies Filed Vitals:   08/02/13 1516  BP: 122/83  Pulse: 68    Physical Exam General: well developed, well nourished young Caucasian male, seated, in no evident distress Head: head normocephalic and atraumatic. Orohparynx benign Neck: supple with no carotid or supraclavicular bruits Cardiovascular: regular rate and rhythm, no murmurs Musculoskeletal: no deformity Skin:  no rash/petichiae Vascular:  Normal pulses all extremities  Neurologic Exam Mental Status: Awake and fully alert. Oriented to place and time. Recent and remote memory intact. Attention span, concentration and fund of knowledge appropriate. Mood and affect appropriate.  Cranial Nerves: Fundoscopic exam reveals sharp disc margins. Pupils equal, briskly reactive to light. Extraocular movements full without nystagmus. Visual fields full to confrontation. Hearing intact. Facial sensation intact. Face, tongue, palate moves normally and symmetrically.  Motor: Normal bulk and tone. Normal strength in all tested extremity muscles. Sensory.: intact to tough and pinprick and vibratory.  Coordination: Rapid alternating movements normal in all extremities. Finger-to-nose and heel-to-shin performed accurately bilaterally. Gait and Station: Arises from chair without difficulty. Stance is normal. Gait demonstrates normal stride length and balance . Able to heel, toe and tandem walk without difficulty.  Reflexes: 1+ and symmetric. Toes downgoing.   NIHSS 0 Modified Rankin 1   ASSESSMENT: 58 year Caucasian male with left  vertebral artery dissection and small medullary infarct in June 2014 who is doing well from a neurovascular standpoint. Long-standing history of chronic migraine headaches with transformation into chronic daily headaches with partial improvement on Depakote    PLAN: Continue aspirin and Plavix for now and check followup CT angiogram in September and will discontinue aspirin at that time. Increase Depakote to 500 twice daily for migraine prevention. Schedule for Botox for migraine prevention if approved by insurance.

## 2013-08-10 ENCOUNTER — Other Ambulatory Visit: Payer: BC Managed Care – PPO

## 2013-08-10 ENCOUNTER — Ambulatory Visit: Payer: Self-pay | Admitting: Cardiology

## 2013-08-22 ENCOUNTER — Ambulatory Visit
Admission: RE | Admit: 2013-08-22 | Discharge: 2013-08-22 | Disposition: A | Discharge status: Home / Self Care | Payer: Managed Care, Other (non HMO) | Source: Ambulatory Visit | Attending: Neurology | Admitting: Neurology

## 2013-08-22 DIAGNOSIS — I7774 Dissection of vertebral artery: Secondary | ICD-10-CM

## 2013-08-22 MED ORDER — IOHEXOL 350 MG/ML SOLN: 100.0000 mL | Freq: Once | INTRAVENOUS | Status: AC | PRN

## 2013-08-23 ENCOUNTER — Other Ambulatory Visit: Payer: Self-pay

## 2013-08-23 MED ORDER — DIVALPROEX SODIUM ER 500 MG PO TB24: 500.0000 mg | ORAL_TABLET | Freq: Two times a day (BID) | ORAL | Status: DC

## 2013-08-28 ENCOUNTER — Encounter: Payer: Self-pay | Admitting: Neurology

## 2013-08-29 NOTE — Progress Notes (Signed)
Quick Note:  I called and LMVM for pt on cell #, tried home and not able to LM. Per Dr. Pearlean Brownie, CTA stable, with expected changes. May come off aspirin, but stay on plavix. Inquire about headaches (depakote and interest in botox). ______

## 2013-10-26 ENCOUNTER — Other Ambulatory Visit: Payer: Self-pay

## 2013-11-21 ENCOUNTER — Other Ambulatory Visit: Payer: Self-pay | Admitting: Family Medicine

## 2013-11-21 ENCOUNTER — Telehealth: Payer: Self-pay | Admitting: Internal Medicine

## 2013-11-21 MED ORDER — AMLODIPINE BESYLATE 10 MG PO TABS: 10.0000 mg | ORAL_TABLET | Freq: Every day | ORAL | Status: DC

## 2013-11-21 MED ORDER — CLOPIDOGREL BISULFATE 75 MG PO TABS: 75.0000 mg | ORAL_TABLET | Freq: Every day | ORAL | Status: DC

## 2013-11-21 NOTE — Telephone Encounter (Signed)
Also amlopidine10mg 

## 2013-11-21 NOTE — Telephone Encounter (Signed)
Refill request for clopidogrel 75mg  to [facility]

## 2013-11-21 NOTE — Telephone Encounter (Signed)
Rx refills are done and sent. CLS

## 2013-12-25 ENCOUNTER — Ambulatory Visit
Admission: RE | Admit: 2013-12-25 | Discharge: 2013-12-25 | Disposition: A | Discharge status: Home / Self Care | Payer: Managed Care, Other (non HMO) | Source: Ambulatory Visit | Attending: Family Medicine | Admitting: Family Medicine

## 2013-12-25 ENCOUNTER — Ambulatory Visit (INDEPENDENT_AMBULATORY_CARE_PROVIDER_SITE_OTHER): Payer: Managed Care, Other (non HMO) | Admitting: Family Medicine

## 2013-12-25 ENCOUNTER — Encounter: Payer: Self-pay | Admitting: Family Medicine

## 2013-12-25 VITALS — BP 130/88 | HR 72 | Ht 67.25 in | Wt 190.0 lb

## 2013-12-25 DIAGNOSIS — E782 Mixed hyperlipidemia: Secondary | ICD-10-CM

## 2013-12-25 DIAGNOSIS — I6350 Cerebral infarction due to unspecified occlusion or stenosis of unspecified cerebral artery: Secondary | ICD-10-CM

## 2013-12-25 DIAGNOSIS — M546 Pain in thoracic spine: Secondary | ICD-10-CM

## 2013-12-25 DIAGNOSIS — I639 Cerebral infarction, unspecified: Secondary | ICD-10-CM

## 2013-12-25 DIAGNOSIS — R7301 Impaired fasting glucose: Secondary | ICD-10-CM

## 2013-12-25 DIAGNOSIS — Z23 Encounter for immunization: Secondary | ICD-10-CM

## 2013-12-25 DIAGNOSIS — E1140 Type 2 diabetes mellitus with diabetic neuropathy, unspecified: Secondary | ICD-10-CM | POA: Insufficient documentation

## 2013-12-25 DIAGNOSIS — I10 Essential (primary) hypertension: Secondary | ICD-10-CM

## 2013-12-25 DIAGNOSIS — Z125 Encounter for screening for malignant neoplasm of prostate: Secondary | ICD-10-CM

## 2013-12-25 DIAGNOSIS — Z79899 Other long term (current) drug therapy: Secondary | ICD-10-CM

## 2013-12-25 DIAGNOSIS — F3289 Other specified depressive episodes: Secondary | ICD-10-CM

## 2013-12-25 DIAGNOSIS — F329 Major depressive disorder, single episode, unspecified: Secondary | ICD-10-CM

## 2013-12-25 DIAGNOSIS — Z0000 Encounter for general adult medical examination without abnormal findings: Secondary | ICD-10-CM

## 2013-12-25 LAB — COMPREHENSIVE METABOLIC PANEL
ALBUMIN: 4.8 g/dL (ref 3.5–5.2)
ALT: 41 U/L (ref 0–53)
AST: 24 U/L (ref 0–37)
Alkaline Phosphatase: 67 U/L (ref 39–117)
BUN: 10 mg/dL (ref 6–23)
CALCIUM: 10 mg/dL (ref 8.4–10.5)
CHLORIDE: 100 meq/L (ref 96–112)
CO2: 29 mEq/L (ref 19–32)
Creat: 0.85 mg/dL (ref 0.50–1.35)
GLUCOSE: 106 mg/dL — AB (ref 70–99)
POTASSIUM: 4.6 meq/L (ref 3.5–5.3)
Sodium: 139 mEq/L (ref 135–145)
Total Bilirubin: 0.7 mg/dL (ref 0.3–1.2)
Total Protein: 7.7 g/dL (ref 6.0–8.3)

## 2013-12-25 LAB — LIPID PANEL
CHOLESTEROL: 180 mg/dL (ref 0–200)
HDL: 42 mg/dL (ref >39)
LDL Cholesterol: 85 mg/dL (ref 0–99)
TRIGLYCERIDES: 266 mg/dL — AB (ref <150)
Total CHOL/HDL Ratio: 4.3 Ratio
VLDL: 53 mg/dL — ABNORMAL HIGH (ref 0–40)

## 2013-12-25 LAB — CBC WITH DIFFERENTIAL/PLATELET
BASOS ABS: 0 10*3/uL (ref 0.0–0.1)
Basophils Relative: 1 % (ref 0–1)
EOS PCT: 3 % (ref 0–5)
Eosinophils Absolute: 0.2 10*3/uL (ref 0.0–0.7)
HCT: 49.8 % (ref 39.0–52.0)
Hemoglobin: 17.4 g/dL — ABNORMAL HIGH (ref 13.0–17.0)
LYMPHS PCT: 33 % (ref 12–46)
Lymphs Abs: 1.7 10*3/uL (ref 0.7–4.0)
MCH: 31.4 pg (ref 26.0–34.0)
MCHC: 34.9 g/dL (ref 30.0–36.0)
MCV: 89.9 fL (ref 78.0–100.0)
MONOS PCT: 8 % (ref 3–12)
Monocytes Absolute: 0.4 10*3/uL (ref 0.1–1.0)
NEUTROS ABS: 2.8 10*3/uL (ref 1.7–7.7)
Neutrophils Relative %: 55 % (ref 43–77)
Platelets: 222 10*3/uL (ref 150–400)
RBC: 5.54 MIL/uL (ref 4.22–5.81)
RDW: 13.2 % (ref 11.5–15.5)
WBC: 5.1 10*3/uL (ref 4.0–10.5)

## 2013-12-25 LAB — POCT URINALYSIS DIPSTICK
Bilirubin, UA: NEGATIVE
Blood, UA: NEGATIVE
GLUCOSE UA: NEGATIVE
KETONES UA: NEGATIVE
LEUKOCYTES UA: NEGATIVE
Nitrite, UA: NEGATIVE
PROTEIN UA: NEGATIVE
Spec Grav, UA: 1.01
UROBILINOGEN UA: NEGATIVE
pH, UA: 5

## 2013-12-25 LAB — TSH: TSH: 2.765 u[IU]/mL (ref 0.350–4.500)

## 2013-12-25 NOTE — Patient Instructions (Signed)

## 2013-12-25 NOTE — Progress Notes (Signed)
Chief Complaint  Patient presents with  . Annual Exam    fasting annual exam, no concerns.    Curtis Lam is a 53 y.o. male who presents for a complete physical.  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 He saw neuro in f/u in August.  Since neuro f/u he has stopped aspirin, and will be on plavix longterm.  He hasn't had any vertigo since his last neuro appt.  Migraines:  He was put on depakote by neuro, which did seem to help with his headaches. Insurance didn't cover botox injections, so he never tried that for his headaches.  He stopped depakote due to financial stressors, and never restarted it once he realized that his headaches haven't returned.  He thinks perhaps that the ibuprofen he was taking was contributing to his headaches, and since he was told to stop the ibuprofen, he really hasn't been having headaches  Hypertension.  He was off his meds for a month, only back on meds for about 2 weeks.  He admits that he was also off meds another time during the summer.  BP 132/86 about 2 weeks ago.  Had been 120/78 before he ran out of meds. He denies headaches, chest pain (just very sporadic--see ROS).  Hyperlipidemia:  He was off atorvastatin for a month, only restarted 2 weeks ago.  His diet had improved significantly before Thanksgiving, but not quite as good (back to drinking sodas) since then.  He denies side effects of medications.  His wife has been suffering from depression, and was hospitalized last week due to suicidal thoughts. She has been out of work, and was supposed to start a new job today. Significant financial stresses.  He has h/o depression, and has some bad days related to this stress, but overall is doing okay.  Denies suicidal thoughts.  +trouble sleeping (falling asleep, and staying asleep).  Allergies:  He hasn't been using his zyrtec.  Having some nasal congestion and postnasal drainage.  No  significant sinus headaches, discolored mucus, sinus pain or fevers.  Never had sleep study as recommended last year.  No longer complaining of daytime somnolence.  Health Maintenance: Immunization History  Administered Date(s) Administered  . Influenza Split 08/21/2012  . Influenza Whole 08/21/2009, 09/18/2011  . Td 12/22/2003  . Tdap 01/12/2013   Last colonoscopy: 01/02/13 with Deboraha Sprang. hemorrhoids  Last PSA: 12/2012 Dentist: twice yearly  Ophtho: 2 years ago Exercise: None  Past Medical History  Diagnosis Date  . Depression   . Elevated cholesterol   . History of migraine headaches   . Rectal polyp   . Stroke 05/2013    Past Surgical History  Procedure Laterality Date  . Vasectomy    . Tonsillectomy    . Colonoscopy  01/02/13    History   Social History  . Marital Status: Married    Spouse Name: N/A    Number of Children: N/A  . Years of Education: N/A   Occupational History  . Designer, industrial/product    Social History Main Topics  . Smoking status: Never Smoker   . Smokeless tobacco: Never Used  . Alcohol Use: Yes     Comment: 1 beer or wine 2-3 times per month, more in summer than winter  . Drug Use: No  . Sexual Activity: Yes    Partners: Female   Other Topics Concern  . Not on file   Social History Narrative  Lives with wife and son.  Other son lives in [place].  Wife suffers from depression, recent flare and hospitalization (12/2013); Financial stressors    Family History  Problem Relation Age of Onset  . Cancer Paternal Aunt     uterine, ovarian  . Diabetes Maternal Grandfather   . Heart disease Maternal Grandfather   . Alzheimer's disease Father   . Depression Sister   . Diabetes Son 30    Current outpatient prescriptions:amLODipine (NORVASC) 10 MG tablet, Take 1 tablet (10 mg total) by mouth daily., Disp: 30 tablet, Rfl: 3;  atorvastatin (LIPITOR) 10 MG tablet, Take 1 tablet (10 mg total) by mouth daily at 6 PM., Disp: 30 tablet, Rfl: 4;  b complex  vitamins tablet, Take 1 tablet by mouth daily., Disp: , Rfl: ;  clopidogrel (PLAVIX) 75 MG tablet, Take 1 tablet (75 mg total) by mouth daily with breakfast., Disp: 30 tablet, Rfl: 3 Misc Natural Products (OSTEO BI-FLEX JOINT SHIELD PO), Take 1 tablet by mouth daily., Disp: , Rfl: ;  multivitamin (ONE-A-DAY MEN'S) TABS, Take 1 tablet by mouth daily., Disp: , Rfl: ;  cetirizine (ZYRTEC) 10 MG tablet, Take 10 mg by mouth every evening. , Disp: , Rfl:   No Known Allergies  ROS: The patient denies anorexia, fever, headaches, vision loss, decreased hearing, ear pain, hoarseness, palpitations, dizziness, syncope, dyspnea on exertion, cough, swelling, nausea, vomiting, diarrhea, constipation, abdominal pain, melena, hematochezia, indigestion/heartburn, hematuria, incontinence, erectile dysfunction, nocturia, weakened urine stream, dysuria, genital lesions, numbness or tingling (just in his arm in certain positions at night), weakness, tremor, suspicious skin lesions, abnormal bleeding/bruising, or enlarged lymph nodes. +Scratchy throat and congestion x a few days.  Chest pain--short lived, L side, stabbing, a few episodes in a row, at rest. Up to 2-3x/day, then stops for a while. Unchanged x years.  This is much less frequent than it had been in the past. Pain between his shoulder blades in the back over the last 2-3 weeks. Mostly bothers him when he is sleeping at night.  No relation to eating. Right shoulder pain after tearing down a barn--resolved. +trouble sleeping at night (trouble falling asleep due to stress, and staying asleep due to back pain). Up 2-3 times/night to go to the bathroom, but has trouble getting back to sleep at 3:30 am.  He lost down to 178 at Thanksgiving with improved diet, but has started drinking sodas again since then and regained weight. +chronic sinus congestion and PND in mornings.  PHYSICAL EXAM: BP 130/88  Pulse 72  Ht 5' 7.25" (1.708 m)  Wt 190 lb (86.183 kg)  BMI  29.54 kg/m2 140/84 on repeat by MD, RA  General Appearance:  Alert, cooperative, no distress, appears stated age   Head:  Normocephalic, without obvious abnormality, atraumatic   Eyes:  PERRL, conjunctiva/corneas clear, EOM's intact, fundi  benign   Ears:  Normal TM's and external ear canals   Nose:  Nares normal, mucosa mildly edematous, no purulence or erythema. no drainage or sinus tenderness   Throat:  Lips, mucosa, and tongue normal; teeth and gums normal   Neck:  Supple, no lymphadenopathy; thyroid: no enlargement/tenderness/nodules; no carotid  bruit or JVD   Back:  no CVA tenderness. Tender at thoracic spine between scapula.  No muscle spasm  Lungs:  Clear to auscultation bilaterally without wheezes, rales or ronchi; respirations unlabored   Chest Wall:  No tenderness or deformity   Heart:  Regular rate and rhythm, S1 and S2 normal, no murmur,  rub  or gallop   Breast Exam:  No chest wall tenderness, masses or gynecomastia   Abdomen:  Soft, non-tender, nondistended, normoactive bowel sounds,  no masses, no hepatosplenomegaly   Genitalia:  Normal male external genitalia without lesions. Testicles without masses. No inguinal hernias.   Rectal:  Normal sphincter tone.  Prostate is smooth, normal size, no nodules.  Heme negative stool.  Extremities:  No clubbing, cyanosis or edema   Pulses:  2+ and symmetric all extremities   Skin:  Skin color, texture, turgor normal, no rashes or lesions   Lymph nodes:  Cervical, supraclavicular, and axillary nodes normal   Neurologic:  CNII-XII intact, normal strength, sensation and gait; reflexes 2+ and symmetric throughout          Psych: Normal mood, affect, hygiene and grooming.     ASSESSMENT/PLAN:  Routine general medical examination at a health care facility - Plan: POCT Urinalysis Dipstick, Visual acuity screening, Lipid panel, Comprehensive metabolic panel, CBC with Differential, TSH, PSA  HYPERTENSION - borderline today.  Many  stressors.  Recently back on meds.  Monitor periodically at home, low sodium diet, regular exercise. - Plan: Comprehensive metabolic panel  Mixed hyperlipidemia - expect #'s to be higher than August, only back on lipitor for 2 weeks after being off for a month.  diet improved - Plan: Lipid panel, Comprehensive metabolic panel  Acute ischemic stroke - no residual symptoms  Encounter for long-term (current) use of other medications - Plan: Lipid panel, Comprehensive metabolic panel  Special screening for malignant neoplasm of prostate - Plan: PSA  Depressive disorder, not elsewhere classified - overall doing well, but has had significant increase in stressors.discussed stress reduction, counseling (EAP at work?) - Plan: TSH  Impaired fasting glucose - encouraged daily exercise, weight loss, cut back on sugars in diet - Plan: Hemoglobin A1c  Need for prophylactic vaccination and inoculation against influenza - Plan: Flu Vaccine QUAD 36+ mos IM  Thoracic back pain - check x-ray given bony tenderness (and h/o trauma last Spring) - Plan: DG Thoracic Spine W/Swimmers   Discussed PSA screening (risks/benefits), recommended at least 30 minutes of aerobic activity at least 5 days/week; proper sunscreen use reviewed; healthy diet and alcohol recommendations (less than or equal to 2 drinks/day) reviewed; regular seatbelt use; changing batteries in smoke detectors. Self-testicular exams. Immunization recommendations discussed--flu shot given today. Colonoscopy recommendations reviewed, UTD.   F/u 6 months, sooner prn.  45-50 min visit, at least 15-20 mins spent counseling (diet, exercise, weight loss, BP, depression, stress reduction, insomnia, etc)

## 2013-12-26 LAB — PSA: PSA: 0.63 ng/mL (ref <=4.00)

## 2013-12-26 LAB — HEMOGLOBIN A1C
HEMOGLOBIN A1C: 5.8 % — AB (ref <5.7)
MEAN PLASMA GLUCOSE: 120 mg/dL — AB (ref <117)

## 2014-04-03 ENCOUNTER — Telehealth: Payer: Self-pay | Admitting: Internal Medicine

## 2014-04-03 ENCOUNTER — Other Ambulatory Visit: Payer: Self-pay

## 2014-04-03 MED ORDER — ATORVASTATIN CALCIUM 10 MG PO TABS: 10.0000 mg | ORAL_TABLET | Freq: Every day | ORAL | Status: DC

## 2014-04-03 NOTE — Telephone Encounter (Signed)
Refill request for atorvastatin 10mg #30 to [facility]

## 2014-04-03 NOTE — Telephone Encounter (Signed)
Medication sent in. 

## 2014-04-04 ENCOUNTER — Other Ambulatory Visit: Payer: Self-pay | Admitting: *Deleted

## 2014-04-04 MED ORDER — ATORVASTATIN CALCIUM 10 MG PO TABS: 10.0000 mg | ORAL_TABLET | Freq: Every day | ORAL | Status: DC

## 2014-04-04 NOTE — Telephone Encounter (Signed)
Resent, printed yesterday.

## 2014-04-24 ENCOUNTER — Other Ambulatory Visit: Payer: Self-pay | Admitting: Family Medicine

## 2014-06-25 ENCOUNTER — Encounter: Payer: Self-pay | Admitting: Family Medicine

## 2014-06-25 ENCOUNTER — Ambulatory Visit (INDEPENDENT_AMBULATORY_CARE_PROVIDER_SITE_OTHER): Payer: Managed Care, Other (non HMO) | Admitting: Family Medicine

## 2014-06-25 VITALS — BP 118/78 | HR 72 | Ht 67.25 in | Wt 183.0 lb

## 2014-06-25 DIAGNOSIS — I6350 Cerebral infarction due to unspecified occlusion or stenosis of unspecified cerebral artery: Secondary | ICD-10-CM

## 2014-06-25 DIAGNOSIS — Z79899 Other long term (current) drug therapy: Secondary | ICD-10-CM

## 2014-06-25 DIAGNOSIS — I639 Cerebral infarction, unspecified: Secondary | ICD-10-CM

## 2014-06-25 DIAGNOSIS — I10 Essential (primary) hypertension: Secondary | ICD-10-CM

## 2014-06-25 DIAGNOSIS — E782 Mixed hyperlipidemia: Secondary | ICD-10-CM

## 2014-06-25 DIAGNOSIS — R7301 Impaired fasting glucose: Secondary | ICD-10-CM

## 2014-06-25 LAB — LIPID PANEL
CHOL/HDL RATIO: 3.2 ratio
CHOLESTEROL: 145 mg/dL (ref 0–200)
HDL: 46 mg/dL (ref >39)
LDL CALC: 80 mg/dL (ref 0–99)
Triglycerides: 97 mg/dL (ref <150)
VLDL: 19 mg/dL (ref 0–40)

## 2014-06-25 LAB — CBC WITH DIFFERENTIAL/PLATELET
BASOS ABS: 0 10*3/uL (ref 0.0–0.1)
Basophils Relative: 1 % (ref 0–1)
Eosinophils Absolute: 0.1 10*3/uL (ref 0.0–0.7)
Eosinophils Relative: 3 % (ref 0–5)
HCT: 46.2 % (ref 39.0–52.0)
Hemoglobin: 16.4 g/dL (ref 13.0–17.0)
LYMPHS ABS: 1.6 10*3/uL (ref 0.7–4.0)
LYMPHS PCT: 35 % (ref 12–46)
MCH: 31.2 pg (ref 26.0–34.0)
MCHC: 35.5 g/dL (ref 30.0–36.0)
MCV: 87.8 fL (ref 78.0–100.0)
Monocytes Absolute: 0.3 10*3/uL (ref 0.1–1.0)
Monocytes Relative: 7 % (ref 3–12)
NEUTROS PCT: 54 % (ref 43–77)
Neutro Abs: 2.5 10*3/uL (ref 1.7–7.7)
PLATELETS: 193 10*3/uL (ref 150–400)
RBC: 5.26 MIL/uL (ref 4.22–5.81)
RDW: 13.4 % (ref 11.5–15.5)
WBC: 4.6 10*3/uL (ref 4.0–10.5)

## 2014-06-25 LAB — COMPREHENSIVE METABOLIC PANEL
ALT: 27 U/L (ref 0–53)
AST: 20 U/L (ref 0–37)
Albumin: 4.8 g/dL (ref 3.5–5.2)
Alkaline Phosphatase: 58 U/L (ref 39–117)
BILIRUBIN TOTAL: 0.6 mg/dL (ref 0.2–1.2)
BUN: 14 mg/dL (ref 6–23)
CO2: 27 meq/L (ref 19–32)
Calcium: 9.5 mg/dL (ref 8.4–10.5)
Chloride: 102 mEq/L (ref 96–112)
Creat: 0.79 mg/dL (ref 0.50–1.35)
Glucose, Bld: 109 mg/dL — ABNORMAL HIGH (ref 70–99)
Potassium: 4.2 mEq/L (ref 3.5–5.3)
SODIUM: 137 meq/L (ref 135–145)
TOTAL PROTEIN: 7.1 g/dL (ref 6.0–8.3)

## 2014-06-25 LAB — POCT GLYCOSYLATED HEMOGLOBIN (HGB A1C): Hemoglobin A1C: 5.2

## 2014-06-25 MED ORDER — ATORVASTATIN CALCIUM 10 MG PO TABS: 10.0000 mg | ORAL_TABLET | Freq: Every day | ORAL | Status: DC

## 2014-06-25 MED ORDER — CLOPIDOGREL BISULFATE 75 MG PO TABS: ORAL_TABLET | ORAL | Status: DC

## 2014-06-25 MED ORDER — AMLODIPINE BESYLATE 10 MG PO TABS: ORAL_TABLET | ORAL | Status: DC

## 2014-06-25 NOTE — Patient Instructions (Addendum)
Continue your current medications. Please schedule a follow-up visit with the neurologist.  It is recommended that you get at least 30 minutes of aerobic exercise at least 5 days/week (for weight loss, you may need as much as 60-90 minutes). This can be any activity that gets your heart rate up. This can be divided in 10-15 minute intervals if needed, but try and build up your endurance at least once a week.  Weight bearing exercise is also recommended twice weekly.  Please use sunscreen regularly to all sun-exposed areas of skin, and remember to reapply every couple of hours.  Return sooner than 6 months if noticing any change to the area of concern on your right mid-back.

## 2014-06-25 NOTE — Progress Notes (Signed)
Chief Complaint  Patient presents with  . Med check    fasting med check.     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 He is on plavix longterm. He hasn't had any vertigo, but in the last few months he has a slight "dizzy spell" when he tilts his head back, feels a little unsteady--very different than the vertigo he had in the past.  Very short-lived.  He hasn't been seen by neuro in almost a year, last visit in August 2014 (didn't like the direction he was going with Botox injections for headaches, which he no longer is having).  Migraines: He was put on depakote by neuro last year, which did seem to help with his headaches. Insurance didn't cover botox injections, so he never tried that for his headaches. He stopped depakote over 6 months ago due to financial stressors, and never restarted it once he realized that his headaches haven't returned. He thinks perhaps that the ibuprofen he was taking was contributing to his headaches, and since he was told to stop the ibuprofen he hasn't had any headaches at all (very rarely has a mild one).  Hypertension follow-up:  Blood pressures elsewhere are 120's/72-85.  Denies headaches, chest pain.  Denies side effects of medications.  Hyperlipidemia follow-up:  Patient is reportedly following a low-fat, low cholesterol diet.  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  Financially things are improved.  Allergies: He hasn't been using his zyrtec. Having some nasal congestion and postnasal drainage. No significant sinus headaches, discolored mucus, sinus pain or fevers.   He has been eating smaller portions, eating healthier.  He cut out bread from his diet, but still has lunchmeats and cheese.  He cut out sodas.  He has been drinking a little more beer (over the summer)--cool beer after working outside (4/week,  light beer).  He has lost 7 pounds since his last visit 6 months ago.  He has been working in the yard, gardening.  He has a treadmill, but hasn't yet been using it.  2 months ago he had something on his back that was itching, lasted about a month.  It seems to have resolved, but he would like the skin checked on his back.  For the last 2 nights he noticed ankle swelling.  He is not aware of any significant change in diet. He admits that he was inactive over the weekend due to the rainy weather.  Swelling has been gone by the morning.  Past Medical History  Diagnosis Date  . Depression   . Elevated cholesterol   . History of migraine headaches   . Rectal polyp   . Stroke 05/2013   Past Surgical History  Procedure Laterality Date  . Vasectomy    . Tonsillectomy    . Colonoscopy  01/02/13   History   Social History  . Marital Status: Married    Spouse Name: N/A    Number of Children: N/A  . Years of Education: N/A   Occupational History  . Physiological scientist    Social History Main Topics  . Smoking status: Never Smoker   . Smokeless tobacco: Never Used  . Alcohol Use: Yes     Comment: 1 beer or wine 2-3 times per month, more in summer than winter  . Drug Use: No  . Sexual Activity: Yes    Partners: Female  Other Topics Concern  . Not on file   Social History Narrative   Lives with wife and son.  Other son lives in [place].  Wife suffers from depression, recent flare and hospitalization (12/2013); Financial stressors   Outpatient Encounter Prescriptions as of 06/25/2014  Medication Sig Note  . amLODipine (NORVASC) 10 MG tablet TAKE 1 TABLET BY MOUTH DAILY.   Marland Kitchen atorvastatin (LIPITOR) 10 MG tablet Take 1 tablet (10 mg total) by mouth daily at 6 PM.   . clopidogrel (PLAVIX) 75 MG tablet TAKE 1 TABLET BY MOUTH DAILY WITH BREAKFAST.   . [DISCONTINUED] amLODipine (NORVASC) 10 MG tablet TAKE 1 TABLET BY MOUTH DAILY.   . [DISCONTINUED] atorvastatin (LIPITOR) 10 MG tablet Take 1 tablet  (10 mg total) by mouth daily at 6 PM.   . [DISCONTINUED] clopidogrel (PLAVIX) 75 MG tablet TAKE 1 TABLET BY MOUTH DAILY WITH BREAKFAST.   Marland Kitchen cetirizine (ZYRTEC) 10 MG tablet Take 10 mg by mouth every evening.  06/25/2014: Uses prn, not needing  . [DISCONTINUED] b complex vitamins tablet Take 1 tablet by mouth daily.   . [DISCONTINUED] Misc Natural Products (OSTEO BI-FLEX JOINT SHIELD PO) Take 1 tablet by mouth daily.   . [DISCONTINUED] multivitamin (ONE-A-DAY MEN'S) TABS Take 1 tablet by mouth daily.    No Known Allergies  ROS:  Denies fevers, chills, URI symptoms, cough, shortness of breath, chest pain, nausea, vomiting, bowel changes, bleeding, bruising, urinary complaints, depression or other complaints. See HPI.  PHYSICAL EXAM: BP 118/78  Pulse 72  Ht 5' 7.25" (1.708 m)  Wt 183 lb (83.008 kg)  BMI 28.45 kg/m2 Well developed, pleasant male in no distress.  In good spirits HEENT:  PERRL, EOMI, conjunctiva clear Neck: no lymphadenopathy, thyromegaly or carotid bruit Heart: regular rate and rhythm without murmur Lungs: clear bilaterally Back: no CVA tenderness, no spinal tenderness Abdomen: soft, nontender, no organomegaly or mass Extremities: no edema, 2+ pulses Skin:  Significant actinic damage to his back, many macular hyperpigmented lesions.  Right mid-back there is a macular hyperpigmented lesion, with slightly scaly/rough round lesion overlying (which has consistency of SK) Psych: normal mood, affect, hygiene and grooming Neuro: alert and oriented.  Cranial nerve intact. Normal strength, gait  ASSESSMENT/PLAN:  HYPERTENSION - well controlled - Plan: Comprehensive metabolic panel, amLODipine (NORVASC) 10 MG tablet  Mixed hyperlipidemia - Plan: Lipid panel, atorvastatin (LIPITOR) 10 MG tablet  Impaired fasting glucose - encouraged daily exercise, continued proper diet - Plan: HgB A1c, Comprehensive metabolic panel  Encounter for long-term (current) use of other medications -  Plan: CBC with Differential, Comprehensive metabolic panel, Lipid panel  Acute ischemic stroke - follow up with neurologist, especially given some recurrent (although very short-lived) neuro symptoms. - Plan: clopidogrel (PLAVIX) 75 MG tablet  Encouraged 150 minutes of aerobic exercise weekly.  Lipid, c-met, CBC, A1c today  Lab Results  Component Value Date   HGBA1C 5.2 06/25/2014   Proper use of sunscreen was reviewed.  Significant actinic damage to skin. Recheck in 6 mos--return sooner if noticing any further change to lesion on R back

## 2014-10-05 ENCOUNTER — Other Ambulatory Visit: Payer: Self-pay

## 2014-12-26 ENCOUNTER — Encounter: Payer: Self-pay | Admitting: Family Medicine

## 2014-12-26 NOTE — Telephone Encounter (Signed)
Please confirm his insurance, and offer him sooner appt for acute problem.  Other option is he can start taking either 2 aleve twice daily or ibuprofen 800mg  TID if he hasn't already been taking any NSAIDs.  If he takes either of these, no other OTC pain meds other than tylenol in conjunction (ie no BC or [place], or others). I have not replied to pt, since I cannot answer the first question

## 2015-01-09 ENCOUNTER — Encounter: Payer: Managed Care, Other (non HMO) | Admitting: Family Medicine

## 2015-01-17 ENCOUNTER — Other Ambulatory Visit: Payer: Self-pay | Admitting: Family Medicine

## 2015-01-17 NOTE — Telephone Encounter (Signed)
Patient scheduled for 2/3/6-will NOT be out of meds by then so I will hold off filling until appt next week-patient aware and ok with this.

## 2015-01-17 NOTE — Telephone Encounter (Signed)
Not okay to wait for CPE.  Needs fasting med check now.

## 2015-01-17 NOTE — Telephone Encounter (Signed)
Looks like he canceled his appt for 01/09/15 and rescheduled for 5/16(that was next avail for CPE/med check) Are these okay to fill until then?

## 2015-01-17 NOTE — Telephone Encounter (Signed)
Patient will call me back.

## 2015-01-23 ENCOUNTER — Encounter: Payer: Self-pay | Admitting: Family Medicine

## 2015-01-23 ENCOUNTER — Ambulatory Visit (INDEPENDENT_AMBULATORY_CARE_PROVIDER_SITE_OTHER): Payer: No Typology Code available for payment source | Admitting: Family Medicine

## 2015-01-23 VITALS — BP 130/88 | HR 72 | Ht 67.25 in | Wt 188.0 lb

## 2015-01-23 DIAGNOSIS — R7301 Impaired fasting glucose: Secondary | ICD-10-CM

## 2015-01-23 DIAGNOSIS — Z5181 Encounter for therapeutic drug level monitoring: Secondary | ICD-10-CM

## 2015-01-23 DIAGNOSIS — I639 Cerebral infarction, unspecified: Secondary | ICD-10-CM

## 2015-01-23 DIAGNOSIS — Z23 Encounter for immunization: Secondary | ICD-10-CM

## 2015-01-23 DIAGNOSIS — I6350 Cerebral infarction due to unspecified occlusion or stenosis of unspecified cerebral artery: Secondary | ICD-10-CM

## 2015-01-23 DIAGNOSIS — Z125 Encounter for screening for malignant neoplasm of prostate: Secondary | ICD-10-CM

## 2015-01-23 DIAGNOSIS — I10 Essential (primary) hypertension: Secondary | ICD-10-CM

## 2015-01-23 DIAGNOSIS — R202 Paresthesia of skin: Secondary | ICD-10-CM

## 2015-01-23 DIAGNOSIS — E782 Mixed hyperlipidemia: Secondary | ICD-10-CM

## 2015-01-23 DIAGNOSIS — E7800 Pure hypercholesterolemia, unspecified: Secondary | ICD-10-CM

## 2015-01-23 LAB — LIPID PANEL
Cholesterol: 167 mg/dL (ref 0–200)
HDL: 45 mg/dL (ref >39)
LDL CALC: 94 mg/dL (ref 0–99)
Total CHOL/HDL Ratio: 3.7 Ratio
Triglycerides: 138 mg/dL (ref <150)
VLDL: 28 mg/dL (ref 0–40)

## 2015-01-23 LAB — COMPREHENSIVE METABOLIC PANEL
ALBUMIN: 4.8 g/dL (ref 3.5–5.2)
ALT: 47 U/L (ref 0–53)
AST: 30 U/L (ref 0–37)
Alkaline Phosphatase: 67 U/L (ref 39–117)
BILIRUBIN TOTAL: 0.7 mg/dL (ref 0.2–1.2)
BUN: 17 mg/dL (ref 6–23)
CALCIUM: 9.5 mg/dL (ref 8.4–10.5)
CO2: 26 meq/L (ref 19–32)
Chloride: 103 mEq/L (ref 96–112)
Creat: 0.87 mg/dL (ref 0.50–1.35)
Glucose, Bld: 91 mg/dL (ref 70–99)
Potassium: 4.2 mEq/L (ref 3.5–5.3)
Sodium: 141 mEq/L (ref 135–145)
Total Protein: 7.6 g/dL (ref 6.0–8.3)

## 2015-01-23 LAB — CBC WITH DIFFERENTIAL/PLATELET
Basophils Absolute: 0.1 10*3/uL (ref 0.0–0.1)
Basophils Relative: 1 % (ref 0–1)
Eosinophils Absolute: 0.2 10*3/uL (ref 0.0–0.7)
Eosinophils Relative: 3 % (ref 0–5)
HCT: 47.3 % (ref 39.0–52.0)
Hemoglobin: 16 g/dL (ref 13.0–17.0)
LYMPHS PCT: 31 % (ref 12–46)
Lymphs Abs: 2 10*3/uL (ref 0.7–4.0)
MCH: 30.5 pg (ref 26.0–34.0)
MCHC: 33.8 g/dL (ref 30.0–36.0)
MCV: 90.3 fL (ref 78.0–100.0)
MONOS PCT: 9 % (ref 3–12)
MPV: 10.4 fL (ref 8.6–12.4)
Monocytes Absolute: 0.6 10*3/uL (ref 0.1–1.0)
Neutro Abs: 3.6 10*3/uL (ref 1.7–7.7)
Neutrophils Relative %: 56 % (ref 43–77)
Platelets: 201 10*3/uL (ref 150–400)
RBC: 5.24 MIL/uL (ref 4.22–5.81)
RDW: 13.4 % (ref 11.5–15.5)
WBC: 6.5 10*3/uL (ref 4.0–10.5)

## 2015-01-23 MED ORDER — CLOPIDOGREL BISULFATE 75 MG PO TABS: ORAL_TABLET | ORAL | Status: DC

## 2015-01-23 MED ORDER — ATORVASTATIN CALCIUM 10 MG PO TABS: 10.0000 mg | ORAL_TABLET | Freq: Every day | ORAL | Status: DC

## 2015-01-23 MED ORDER — AMLODIPINE BESYLATE 10 MG PO TABS: ORAL_TABLET | ORAL | Status: DC

## 2015-01-23 NOTE — Progress Notes (Signed)
Chief Complaint  Patient presents with  . Hypertension    fasting med check.    Around Christmas, he had pain in his lower back, at belt level, on the right side.  After about a week, the pain radiated more into the buttock from the back, then later developed numbness/tingling in the leg and in the great toe.  The following week it moved into the 2nd and 3rd toes.  Kind of feels like when you leave a wet bandaid on for too long.  No longer having any pain in the back or buttock.  Still has some numbness in the leg and the toe.  Sometimes the leg will give out if he gets up too quickly, feels like it wants to collapse after a few steps, like the leg needed to wake up.  Patient presents for follow-up, requesting refills of medications. 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 He was supposed to f/u with Dr. Pearlean BrownieSethi in the fall, but that was right when he lost his job.  Got a new job, but schedule is tough--hasn't been able to schedule yet.  He had CT 08/2013, and was told to stop the aspirin, remain on Plavix. He needs refills.  Denies bleeding/bruising. No recurrent vertigo.  He has had no further headaches since stopping taking ibuprofen.  No longer taking Depakote (was for headaches)  He sometimes has some "stuttering" in his vision, like red lights on a car at night that persist/stutter with moving his eyes.  No true vertigo has recurred.   Hypertension follow-up: Blood pressures haven't been checked elsewhere recently. Denies headaches, chest pain, dizziness, edema. Denies side effects of medications.  He continues to eat smaller portions, still has some cheese.  No soda.  He is walking constantly on his job, but no other exercise  Hyperlipidemia follow-up: Patient is reportedly following a low-fat, low cholesterol diet. Compliant with medications and denies medication side effects  Wife recently had kidney removed for a tumor, which was benign.  Her  depression is doing okay.  Allergies: not currently flaring, just some dryness.  Occasionally uses a benadryl at night.  No significant sinus headaches, discolored mucus, sinus pain or fevers.   Past Medical History  Diagnosis Date  . Depression   . Elevated cholesterol   . History of migraine headaches     resolved s/p stopping ibuprofen  . Rectal polyp   . Stroke 05/2013    vertebral artery dissection   Past Surgical History  Procedure Laterality Date  . Vasectomy    . Tonsillectomy    . Colonoscopy  01/02/13   History   Social History  . Marital Status: Married    Spouse Name: N/A    Number of Children: N/A  . Years of Education: N/A   Occupational History  . Service Center Manager [name]    Social History Main Topics  . Smoking status: Never Smoker   . Smokeless tobacco: Never Used  . Alcohol Use: Yes     Comment: 1 beer or wine 2-3 times per month, more in summer than winter  . Drug Use: No  . Sexual Activity:    Partners: Female   Other Topics Concern  . Not on file   Social History Narrative   Lives with wife and son.  Other son lives in [place].  Wife suffers from depression, recent flare and hospitalization (12/2013); He lost his job 09/2014 for 4-5 weeks, and got new job  Family History  Problem Relation Age of Onset  . Cancer Paternal Aunt     uterine, ovarian  . Diabetes Maternal Grandfather   . Heart disease Maternal Grandfather   . Alzheimer's disease Father   . Depression Sister   . Diabetes Son 75   Outpatient Encounter Prescriptions as of 01/23/2015  Medication Sig Note  . amLODipine (NORVASC) 10 MG tablet TAKE 1 TABLET BY MOUTH DAILY.   Marland Kitchen atorvastatin (LIPITOR) 10 MG tablet Take 1 tablet (10 mg total) by mouth daily at 6 PM.   . clopidogrel (PLAVIX) 75 MG tablet TAKE 1 TABLET BY MOUTH DAILY WITH BREAKFAST.   Marland Kitchen cetirizine (ZYRTEC) 10 MG tablet Take 10 mg by mouth every evening.  06/25/2014: Uses prn, not needing   ROS:  Denies fevers, chills,  headaches, dizziness, chest pain, palpitations, cough, shortness of breath, nausea, vomiting, bowel changes. Denies bleeding, bruising, rashes. Moods are good. Much less stress in his current job. Slight nausea after taking morning meds, but short-lived.  PHYSICAL EXAM: BP 130/88 mmHg  Pulse 72  Ht 5' 7.25" (1.708 m)  Wt 188 lb (85.276 kg)  BMI 29.23 kg/m2 Weight is wearing steel toed shoes today.  (186-189 at home) 134/82 on repeat by MD Well developed, pleasant male in no distress. In good spirits HEENT: PERRL, EOMI, conjunctiva clear. OP clear Neck: no lymphadenopathy, thyromegaly or carotid bruit Heart: regular rate and rhythm, no murmur Lungs: clear bilaterally Abdomen:  Soft, nontender, no organomegaly or mass. Abdominal obesity Extremities: 2+ pulse, no edema Neuro: alert and oriented.  Cranial nerves 2-12 intact. Normal strength, sensation, DTR's, gait. Back: no spinal tenderness, CVA tenderness or SI joint tenderness. No muscle spasm. Psych: normal mood, affect, hygiene and grooming  ASSESSMENT/PLAN:  Essential hypertension - borderline.  periodically monitor. low sodium diet, regular exercise, weight loss - Plan: Comprehensive metabolic panel, amLODipine (NORVASC) 10 MG tablet  Medication monitoring encounter - Plan: Lipid panel, Comprehensive metabolic panel, CBC with Differential/Platelet, TSH  Prostate cancer screening - Plan: PSA  Pure hypercholesterolemia - Plan: Lipid panel, Comprehensive metabolic panel  Impaired fasting glucose - Plan: Comprehensive metabolic panel, Hemoglobin A1c  Mixed hyperlipidemia - Plan: atorvastatin (LIPITOR) 10 MG tablet  Acute ischemic stroke - due to vertebral dissection.  symptoms resolved.  continue Plavix, as I believe this is intended longterm.  past due for neuro f/u, will schedule - Plan: clopidogrel (PLAVIX) 75 MG tablet  Need for prophylactic vaccination and inoculation against influenza - Plan: Flu Vaccine QUAD 36+ mos PF IM  (Fluarix Quad PF)  Right leg paresthesias - and toes--possibly lumbar radiculopathy--pain resolved, no weakness, some persistent tingling. pt to mention to neuro at f/u, sooner if +weakness  Aerobic exercise 150-180 mins/week (walking on job is not aerobic, per pt). F/u with Dr. Pearlean Brownie F/u with ophtho  F/u as scheduled for CPE

## 2015-01-23 NOTE — Patient Instructions (Signed)
Try and get at least 150-180 minutes of aerobic exercise each week (ie 30 minutes a day, most days--can be split into 10-15 minutes intervals, if needed).  Please schedule follow-up with Dr. Pearlean BrownieSethi Please schedule routine eye exam.  Periodically monitor your blood pressure elsewhere. Try and keep working on losing inches at the waist.

## 2015-01-24 ENCOUNTER — Encounter: Payer: Self-pay | Admitting: Family Medicine

## 2015-01-24 LAB — HEMOGLOBIN A1C
Hgb A1c MFr Bld: 6 % — ABNORMAL HIGH (ref <5.7)
MEAN PLASMA GLUCOSE: 126 mg/dL — AB (ref <117)

## 2015-01-24 LAB — PSA: PSA: 0.6 ng/mL (ref <=4.00)

## 2015-01-24 LAB — TSH: TSH: 2.589 u[IU]/mL (ref 0.350–4.500)

## 2015-05-06 ENCOUNTER — Encounter: Payer: Managed Care, Other (non HMO) | Admitting: Family Medicine

## 2015-05-06 ENCOUNTER — Telehealth: Payer: Self-pay | Admitting: *Deleted

## 2015-05-06 DIAGNOSIS — Z0000 Encounter for general adult medical examination without abnormal findings: Secondary | ICD-10-CM

## 2015-05-06 NOTE — Telephone Encounter (Signed)
He needs to be assessed no-show fee.  No urgent f/u needed, but at some point should reschedule his CPE

## 2015-05-06 NOTE — Telephone Encounter (Signed)
This patient no showed for their appointment today.Which of the following is necessary for this patient.   System states that there was NO answer.  A) No follow-up necessary   B) Follow-up urgent. Locate Patient Immediately.   C) Follow-up necessary. Contact patient and Schedule visit in ____ Days.   D) Follow-up Advised. Contact patient and Schedule visit in ____ Days.

## 2015-05-10 ENCOUNTER — Encounter: Payer: Self-pay | Admitting: Family Medicine

## 2015-10-16 NOTE — Telephone Encounter (Signed)
Error

## 2016-08-05 ENCOUNTER — Ambulatory Visit (INDEPENDENT_AMBULATORY_CARE_PROVIDER_SITE_OTHER): Payer: No Typology Code available for payment source | Admitting: Neurology

## 2016-08-05 ENCOUNTER — Encounter: Payer: Self-pay | Admitting: Neurology

## 2016-08-05 VITALS — BP 150/96 | HR 84 | Ht 67.0 in | Wt 194.2 lb

## 2016-08-05 DIAGNOSIS — I7774 Dissection of vertebral artery: Secondary | ICD-10-CM

## 2016-08-05 DIAGNOSIS — R42 Dizziness and giddiness: Secondary | ICD-10-CM | POA: Diagnosis not present

## 2016-08-05 DIAGNOSIS — Z8673 Personal history of transient ischemic attack (TIA), and cerebral infarction without residual deficits: Secondary | ICD-10-CM | POA: Diagnosis not present

## 2016-08-05 MED ORDER — ATORVASTATIN CALCIUM 10 MG PO TABS: 10.0000 mg | ORAL_TABLET | Freq: Every day | ORAL | 3 refills | Status: DC

## 2016-08-05 MED ORDER — AMLODIPINE BESYLATE 5 MG PO TABS: 5.0000 mg | ORAL_TABLET | Freq: Every day | ORAL | 3 refills | Status: DC

## 2016-08-05 MED ORDER — CLOPIDOGREL BISULFATE 75 MG PO TABS: 75.0000 mg | ORAL_TABLET | Freq: Every day | ORAL | 11 refills | Status: DC

## 2016-08-05 NOTE — Progress Notes (Signed)
yours

## 2016-08-05 NOTE — Progress Notes (Signed)
[facility] [address] [place]. [place] [address] [phone]       OFFICE CONSULT NOTE  Curtis Lam Date of Birth:  02/19/1961 Medical Record Number:  [id number]   Referring MD:  Joselyn ArrowEve Knapp Reason for Referral:  dizziness HPI: Mr Curtis Lam is a [age] year  Caucasian male who's been having intermittent symptoms of transient dizziness and imbalance upon arising from it chair or bed for the last 6 months. Sensorimotor pronounced in the last 1 month or so. He describes a feeling of lightheadedness. This may also be brought on by Curtis activities in which extends his neck up. Denies true vertigo and nausea loss of vision or double vision. 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 He saw me at that time and came for follow-up once in the office in follow-up CT angiogram on 08/22/13 which I personally reviewed showed occluded left vertebral artery. Patient was maintained be on antiplatelet therapy, medications were hypertension and lipids. He states he stopped all medications 2 years ago and was lost to follow-up to me. He did get prescription refill for about a year by his primary physician but when he called with new increase in symptoms he was told to come back to see me for further evaluation. Denies any significant headaches, diplopia, focal extremity weakness falls or injuries. He does not smoke. States his blood pressure has been running slightly high and today it is 150/92. He has no Curtis new health problems. He denies any Curtis complaints.  ROS:   14 system review of systems is positive for  dizziness, imbalance and all Curtis  systems negative  PMH:  Past Medical History:  Diagnosis Date  . Depression   . Elevated cholesterol   . History of migraine headaches    resolved s/p stopping ibuprofen  . Rectal polyp   . Stroke [facility](HCC) 05/2013   vertebral artery dissection    Social  History:  Social History   Social History  . Marital status: Married    Spouse name: Curtis Lam  . Number of children: Curtis Lam  . Years of education: Curtis Lam   Occupational History  . Service Center Manager [name]   Social History Main Topics  . Smoking status: Never Smoker  . Smokeless tobacco: Never Used  . Alcohol use 0.6 oz/week    1 Cans of beer per week     Comment: 1 beer or wine 2-3 times per month, more in summer than winter  . Drug use: No  . Sexual activity: Yes    Partners: Female   Curtis Topics Concern  . Not on file   Social History Narrative   Lives with Curtis Lam and Lam.  Curtis Lam lives in [place].  Curtis Lam suffers from depression, recent flare and hospitalization (12/2013); He lost his job 09/2014 for 4-5 weeks, and got new job    Medications:   No current outpatient prescriptions on file prior to visit.   No current facility-administered medications on file prior to visit.     Allergies:  No Known Allergies  Physical Exam General: well developed, well nourished, seated, in no evident distress Head: head normocephalic and atraumatic.   Neck: supple with no carotid or supraclavicular bruits Cardiovascular: regular rate and rhythm, no murmurs Musculoskeletal: no deformity Skin:  no rash/petichiae Vascular:  Normal pulses all extremities  Neurologic Exam Mental Status: Awake and fully alert. Oriented to place and time. Recent  and remote memory intact. Attention span, concentration and fund of knowledge appropriate. Mood and affect appropriate.  Cranial Nerves: Fundoscopic exam reveals sharp disc margins. Pupils equal, briskly reactive to light. Extraocular movements full without nystagmus. Visual fields full to confrontation. Hearing intact. Facial sensation intact. Face, tongue, palate moves normally and symmetrically.  Motor: Normal bulk and tone. Normal strength in all tested extremity muscles. Sensory.: intact to touch , pinprick , position and vibratory sensation.    Coordination: Rapid alternating movements normal in all extremities. Finger-to-nose and heel-to-shin performed accurately bilaterally. Gait and Station: Arises from chair without difficulty. Stance is normal. Gait demonstrates normal stride length and balance . Able to heel, toe and tandem walk without difficulty.  Reflexes: 1+ and symmetric. Toes downgoing.   NIHSS  0 Modified Rankin  1   ASSESSMENT: [age] year Caucasian male with transient positional dizziness likely multifactorial due to combination of orthostatic hypotension and mild residual deficits from an old brainstem infarct. 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 Vascular risk factors of hypertension hyperlipidemia.    PLAN: I had a long d/w patient about his remote stroke,left vertebral artery dissection, positional dizziness, risk for recurrent stroke/TIAs, personally independently reviewed imaging studies and stroke evaluation results and answered questions.CResume Plavix for secondary stroke prevention and maintain strict control of hypertension with blood pressure goal below 130/90, diabetes with hemoglobin A1c goal below 6.5% and lipids with LDL cholesterol goal below 70 mg/dL. I gave her a prescription for Plavix, Norvasc and Lipitor. Plan to check lipid profile and hemoglobin A1c in 2 months at his primary physician's office I also advised the patient to eat a healthy diet with plenty of whole grains, cereals, fruits and vegetables, exercise regularly and maintain ideal body weight .we also discussed doing orthostatic tolerance exercises prior to getting up. Greater than 50% time during this 45 minute  consultation visit was spent on counseling and coordination of care Followup in the future with my nurse practitioner in 3 months or call earlier if necessary. Delia HeadyPramod Jonovan Boedecker, MD  [facility] [address] [place], [place]  [id number]  Phone [phone] Fax [phone] Note: This document was prepared with digital dictation and possible smart phrase technology. Any transcriptional errors that result from this process are unintentional.

## 2016-08-05 NOTE — Patient Instructions (Signed)
I had a long d/w patient about his remote stroke,left vertebral artery dissection, positional dizziness risk for recurrent stroke/TIAs, personally independently reviewed imaging studies and stroke evaluation results and answered questions.CResume Plavix for secondary stroke prevention and maintain strict control of hypertension with blood pressure goal below 130/90, diabetes with hemoglobin A1c goal below 6.5% and lipids with LDL cholesterol goal below 70 mg/dL. I gave her a prescription for Plavix, Norvasc and Lipitor. Plan to check lipid profile and hemoglobin A1c in 2 months at his primary physician's office I also advised the patient to eat a healthy diet with plenty of whole grains, cereals, fruits and vegetables, exercise regularly and maintain ideal body weight .we also discussed doing orthostatic tolerance exercises prior to getting up. Followup in the future with my nurse practitioner in 3 months or call earlier if necessary.   Dizziness Dizziness is a common problem. It is a feeling of unsteadiness or light-headedness. You may feel like you are about to faint. Dizziness can lead to injury if you stumble or fall. Anyone can become dizzy, but dizziness is more common in older adults. This condition can be caused by a number of things, including medicines, dehydration, or illness. HOME CARE INSTRUCTIONS Taking these steps may help with your condition: Eating and Drinking  Drink enough fluid to keep your urine clear or pale yellow. This helps to keep you from becoming dehydrated. Try to drink more clear fluids, such as water.  Do not drink alcohol.  Limit your caffeine intake if directed by your health care provider.  Limit your salt intake if directed by your health care provider. Activity  Avoid making quick movements.  Rise slowly from chairs and steady yourself until you feel okay.  In the morning, first sit up on the side of the bed. When you feel okay, stand slowly while you hold  onto something until you know that your balance is fine.  Move your legs often if you need to stand in one place for a long time. Tighten and relax your muscles in your legs while you are standing.  Do not drive or operate heavy machinery if you feel dizzy.  Avoid bending down if you feel dizzy. Place items in your home so that they are easy for you to reach without leaning over. Lifestyle  Do not use any tobacco products, including cigarettes, chewing tobacco, or electronic cigarettes. If you need help quitting, ask your health care provider.  Try to reduce your stress level, such as with yoga or meditation. Talk with your health care provider if you need help. General Instructions  Watch your dizziness for any changes.  Take medicines only as directed by your health care provider. Talk with your health care provider if you think that your dizziness is caused by a medicine that you are taking.  Tell a friend or a family member that you are feeling dizzy. If he or she notices any changes in your behavior, have this person call your health care provider.  Keep all follow-up visits as directed by your health care provider. This is important. SEEK MEDICAL CARE IF:  Your dizziness does not go away.  Your dizziness or light-headedness gets worse.  You feel nauseous.  You have reduced hearing.  You have new symptoms.  You are unsteady on your feet or you feel like the room is spinning. SEEK IMMEDIATE MEDICAL CARE IF:  You vomit or have diarrhea and are unable to eat or drink anything.  You have problems talking,  walking, swallowing, or using your arms, hands, or legs.  You feel generally weak.  You are not thinking clearly or you have trouble forming sentences. It may take a friend or family member to notice this.  You have chest pain, abdominal pain, shortness of breath, or sweating.  Your vision changes.  You notice any bleeding.  You have a headache.  You have neck  pain or a stiff neck.  You have a fever.   This information is not intended to replace advice given to you by your health care provider. Make sure you discuss any questions you have with your health care provider.   Document Released: 06/02/2001 Document Revised: 04/23/2015 Document Reviewed: 12/03/2014 Elsevier Interactive Patient Education [employer]2016 Elsevier Inc.

## 2016-08-10 ENCOUNTER — Telehealth: Payer: Self-pay | Admitting: Emergency Medicine

## 2016-08-10 NOTE — Telephone Encounter (Signed)
Pts wife called and states she is a pt of Dr Okey Duprerawford. She wants to know if you would except her husband as a patient as well. Please advise thanks.

## 2016-08-10 NOTE — Telephone Encounter (Signed)
Fine, [facility] be new [facility] to [facility]

## 2016-08-10 NOTE — Telephone Encounter (Signed)
Called pt back and left message. Told him to call back and schedule new pt appt. Thanks.

## 2016-08-13 ENCOUNTER — Encounter: Payer: Self-pay | Admitting: Internal Medicine

## 2016-08-13 ENCOUNTER — Other Ambulatory Visit (INDEPENDENT_AMBULATORY_CARE_PROVIDER_SITE_OTHER): Payer: PRIVATE HEALTH INSURANCE

## 2016-08-13 ENCOUNTER — Ambulatory Visit (INDEPENDENT_AMBULATORY_CARE_PROVIDER_SITE_OTHER): Payer: PRIVATE HEALTH INSURANCE | Admitting: Internal Medicine

## 2016-08-13 VITALS — BP 160/80 | HR 90 | Temp 98.2°F | Resp 14 | Ht 67.0 in | Wt 195.0 lb

## 2016-08-13 DIAGNOSIS — Z8673 Personal history of transient ischemic attack (TIA), and cerebral infarction without residual deficits: Secondary | ICD-10-CM

## 2016-08-13 DIAGNOSIS — Z1159 Encounter for screening for other viral diseases: Secondary | ICD-10-CM | POA: Diagnosis not present

## 2016-08-13 DIAGNOSIS — M79671 Pain in right foot: Secondary | ICD-10-CM

## 2016-08-13 DIAGNOSIS — R29898 Other symptoms and signs involving the musculoskeletal system: Secondary | ICD-10-CM | POA: Diagnosis not present

## 2016-08-13 DIAGNOSIS — Z23 Encounter for immunization: Secondary | ICD-10-CM

## 2016-08-13 DIAGNOSIS — Z114 Encounter for screening for human immunodeficiency virus [HIV]: Secondary | ICD-10-CM

## 2016-08-13 DIAGNOSIS — R7301 Impaired fasting glucose: Secondary | ICD-10-CM

## 2016-08-13 DIAGNOSIS — G44209 Tension-type headache, unspecified, not intractable: Secondary | ICD-10-CM

## 2016-08-13 DIAGNOSIS — R911 Solitary pulmonary nodule: Secondary | ICD-10-CM | POA: Diagnosis not present

## 2016-08-13 DIAGNOSIS — E782 Mixed hyperlipidemia: Secondary | ICD-10-CM

## 2016-08-13 DIAGNOSIS — I10 Essential (primary) hypertension: Secondary | ICD-10-CM

## 2016-08-13 LAB — COMPREHENSIVE METABOLIC PANEL
ALBUMIN: 4.9 g/dL (ref 3.5–5.2)
ALT: 45 U/L (ref 0–53)
AST: 30 U/L (ref 0–37)
Alkaline Phosphatase: 47 U/L (ref 39–117)
BUN: 23 mg/dL (ref 6–23)
CALCIUM: 9.2 mg/dL (ref 8.4–10.5)
CHLORIDE: 105 meq/L (ref 96–112)
CO2: 27 meq/L (ref 19–32)
Creatinine, Ser: 0.85 mg/dL (ref 0.40–1.50)
GFR: 98.57 mL/min (ref >60.00)
Glucose, Bld: 132 mg/dL — ABNORMAL HIGH (ref 70–99)
POTASSIUM: 4.2 meq/L (ref 3.5–5.1)
Sodium: 139 mEq/L (ref 135–145)
Total Bilirubin: 0.6 mg/dL (ref 0.2–1.2)
Total Protein: 7.8 g/dL (ref 6.0–8.3)

## 2016-08-13 LAB — CBC
HEMATOCRIT: 49 % (ref 39.0–52.0)
HEMOGLOBIN: 17.1 g/dL — AB (ref 13.0–17.0)
MCHC: 34.9 g/dL (ref 30.0–36.0)
MCV: 91.6 fl (ref 78.0–100.0)
Platelets: 190 10*3/uL (ref 150.0–400.0)
RBC: 5.35 Mil/uL (ref 4.22–5.81)
RDW: 12.1 % (ref 11.5–15.5)
WBC: 5.9 10*3/uL (ref 4.0–10.5)

## 2016-08-13 LAB — LIPID PANEL
CHOL/HDL RATIO: 4
CHOLESTEROL: 159 mg/dL (ref 0–200)
HDL: 44.7 mg/dL (ref >39.00)
LDL CALC: 84 mg/dL (ref 0–99)
NonHDL: 114.5
TRIGLYCERIDES: 154 mg/dL — AB (ref 0.0–149.0)
VLDL: 30.8 mg/dL (ref 0.0–40.0)

## 2016-08-13 LAB — HEMOGLOBIN A1C: Hgb A1c MFr Bld: 6.4 % (ref 4.6–6.5)

## 2016-08-13 NOTE — Patient Instructions (Addendum)
We will check an x-ray of the low back today and call you back with the results. If we do not find anything we will send you to an orthopedic doctor to help with the right leg symptoms.   We are checking the blood work today.   We are also ordering the CT scan of the chest to follow up those nodules from 2014.   Try to take the medicines daily and we need to watch the blood pressure. We want to see you back in about 3 months and if the blood pressure is still high we will need to change the medicines.

## 2016-08-13 NOTE — Progress Notes (Signed)
   Subjective:    Patient ID: Curtis Lam, male    DOB: 10-15-1961, 55 y.o.   MRN: [id number]  HPI The patient is a new 55 YO man coming in with several concerns including cough (going on for 1 month, with some nasal drainage, tried some OTC medications, has chronic nasal dripping, no fevers or chills, overall 1 coughing spell per day, no vomiting with coughing), right foot pain/right leg weakness (his leg is buckling some in the last several months, he did not discuss with the neurologist recently, no labs recently or any imaging, denies sudden onset, no injury to the low back, some pain in the right foot with some clawing of the toes during pain), headaches (more on the right side of his head, going on for years, previous CT and MRI without findings, not severe but present most days). Please see A/P for status and treatment plan for chronic medical problems addressed during the visit.   [facility], social history reviewed and updated.   Review of Systems  Constitutional: Positive for activity change. Negative for appetite change, chills, fatigue, fever and unexpected weight change.  HENT: Positive for congestion, postnasal drip and rhinorrhea. Negative for sinus pressure and sore throat.   Eyes: Negative.   Respiratory: Positive for cough. Negative for chest tightness, shortness of breath and wheezing.   Cardiovascular: Negative for chest pain, palpitations and leg swelling.  Gastrointestinal: Negative for abdominal distention, abdominal pain, constipation, diarrhea, nausea and vomiting.  Musculoskeletal: Positive for arthralgias and gait problem. Negative for joint swelling and myalgias.  Skin: Negative.   Neurological: Positive for weakness and headaches. Negative for dizziness, seizures, speech difficulty and light-headedness.  Psychiatric/Behavioral: Negative.       Objective:   Physical Exam  Constitutional: He is oriented to person, place, and time. He appears well-developed and  well-nourished.  HENT:  Head: Normocephalic and atraumatic.  Oropharynx with some mild erythema and no crusting nares, no CAD.  Eyes: EOM are normal.  Neck: Normal range of motion.  Cardiovascular: Normal rate and regular rhythm.   Pulmonary/Chest: Effort normal and breath sounds normal. No respiratory distress. He has no wheezes. He has no rales.  Abdominal: Soft. Bowel sounds are normal. He exhibits no distension. There is no tenderness. There is no rebound.  Musculoskeletal:  No tenderness midline lumbar region  Lymphadenopathy:    He has no cervical adenopathy.  Neurological: He is alert and oriented to person, place, and time. Coordination normal.  Skin: Skin is warm and dry.  Psychiatric: He has a normal mood and affect.   Vitals:   08/13/16 0858 08/13/16 1000  BP: (!) 162/102 (!) 160/80  Pulse: 90   Resp: 14   Temp: 98.2 F (36.8 C)   TempSrc: Oral   SpO2: 95%   Weight: 195 lb (88.5 kg)   Height: 5\' 7"  (1.702 m)       Assessment & Plan:  Flu shot given at visit

## 2016-08-13 NOTE — Progress Notes (Signed)
Pre visit review using our clinic review tool, if applicable. No additional management support is needed unless otherwise documented below in the visit note. 

## 2016-08-14 DIAGNOSIS — R911 Solitary pulmonary nodule: Secondary | ICD-10-CM | POA: Insufficient documentation

## 2016-08-14 DIAGNOSIS — R51 Headache: Secondary | ICD-10-CM

## 2016-08-14 DIAGNOSIS — R519 Headache, unspecified: Secondary | ICD-10-CM | POA: Insufficient documentation

## 2016-08-14 LAB — HEPATITIS C ANTIBODY: HCV AB: NEGATIVE

## 2016-08-14 LAB — HIV ANTIBODY (ROUTINE TESTING W REFLEX): HIV: NONREACTIVE

## 2016-08-14 NOTE — Assessment & Plan Note (Signed)
Seen in 2014 with his stroke. He was supposed to have follow up about 1 year later which never happened. Ordered CT chest w contrast for follow up.

## 2016-08-14 NOTE — Assessment & Plan Note (Signed)
Due to the acute nature of his other concerns this was not fully addressed. He may be going to see orthopedics for the right leg weakness and can be addressed concurrently.

## 2016-08-14 NOTE — Assessment & Plan Note (Signed)
Checking X-ray of the lumbar region to rule out changes from prior. If no changes needs to see orthopedics to find the etiology as well as the right foot pain.

## 2016-08-14 NOTE — Assessment & Plan Note (Signed)
He denies sequelae of the stroke. He needs better BP control and have asked him to start taking the medicine daily and explained the importance. He is on plavix and lipitor daily. Checking HgA1c, lipid panel as none in about 2 years. Unclear why not checked with neurology as he had no primary at the time.

## 2016-08-14 NOTE — Assessment & Plan Note (Signed)
Checking lipid panel and adjust lipitor 10 mg daily as needed.  

## 2016-08-14 NOTE — Assessment & Plan Note (Signed)
He is not bothered by the pain. Since these were happening before the dissection and are not changed after imaging is not indicated at this time. We need to control his BP to see if that is related. If he needs another agent we can pick beta blocker for headache control.

## 2016-08-14 NOTE — Assessment & Plan Note (Signed)
BP is high today and he admits to missing doses of his amlodipine. It is unclear if this will be enough for his blood pressure. Will need quick visit back as he has only had amlodipine 1 week. Talked to him about the risk for heart disease and stroke if his blood pressure is still elevated. Checking CMP and adjust as needed.

## 2016-08-14 NOTE — Assessment & Plan Note (Signed)
Last Hga1c is 6 but none in some time. Checking HgA1c today and adjust as needed.

## 2016-08-17 ENCOUNTER — Encounter: Payer: No Typology Code available for payment source | Admitting: Family Medicine

## 2016-08-23 ENCOUNTER — Other Ambulatory Visit: Payer: PRIVATE HEALTH INSURANCE

## 2016-08-26 ENCOUNTER — Ambulatory Visit
Admission: RE | Admit: 2016-08-26 | Discharge: 2016-08-26 | Disposition: A | Discharge status: Home / Self Care | Payer: PRIVATE HEALTH INSURANCE | Source: Ambulatory Visit | Attending: Neurology | Admitting: Neurology

## 2016-08-26 ENCOUNTER — Ambulatory Visit
Admission: RE | Admit: 2016-08-26 | Discharge: 2016-08-26 | Disposition: A | Discharge status: Home / Self Care | Payer: PRIVATE HEALTH INSURANCE | Source: Ambulatory Visit | Attending: Internal Medicine | Admitting: Internal Medicine

## 2016-08-26 DIAGNOSIS — Z8673 Personal history of transient ischemic attack (TIA), and cerebral infarction without residual deficits: Secondary | ICD-10-CM

## 2016-08-26 DIAGNOSIS — I7774 Dissection of vertebral artery: Secondary | ICD-10-CM

## 2016-08-26 DIAGNOSIS — R29898 Other symptoms and signs involving the musculoskeletal system: Secondary | ICD-10-CM

## 2016-08-26 DIAGNOSIS — R911 Solitary pulmonary nodule: Secondary | ICD-10-CM

## 2016-08-26 MED ORDER — GADOBENATE DIMEGLUMINE 529 MG/ML IV SOLN
18.0000 mL | Freq: Once | INTRAVENOUS | Status: AC | PRN
Start: 2016-08-26 — End: 2016-08-26
  Administered 2016-08-26: 18 mL via INTRAVENOUS

## 2016-08-26 MED ORDER — IOPAMIDOL (ISOVUE-300) INJECTION 61%
75.0000 mL | Freq: Once | INTRAVENOUS | Status: AC | PRN
  Administered 2016-08-26: 75 mL via INTRAVENOUS

## 2016-08-28 ENCOUNTER — Telehealth: Payer: Self-pay | Admitting: Internal Medicine

## 2016-08-28 NOTE — Telephone Encounter (Signed)
Wife called back stating she had a missed call.  Would like for you to call husband back at number provided.

## 2016-08-31 NOTE — Telephone Encounter (Signed)
Wife called back stating to call back at [phone] or [phone].

## 2016-08-31 NOTE — Telephone Encounter (Signed)
Called and spoke to patient about his chest CT results.

## 2016-09-01 ENCOUNTER — Telehealth: Payer: Self-pay | Admitting: Neurology

## 2016-09-01 NOTE — Telephone Encounter (Signed)
Rn spoke with patients wife on dpr about her husbands mra, mri and mr angiogram results. Rn stated per Dr. Tessa LernerSethi notePramod S Sethi, MD at 09/01/2016 5:16 PM   Status: Signed    Kindly inform the patient had MRI scan of the brain showed no new or worrisome findings. Mild changes of hardening of the arteries in the brain and slight shrinkage towards the back. Chronic occlusion of the left vertebral artery which is an old and known findings unchanged since 2014     Pts wife verbalized understanding.

## 2016-09-01 NOTE — Telephone Encounter (Signed)
Wife called to request results of MRI/MRA, please call [phone].

## 2016-09-01 NOTE — Telephone Encounter (Signed)
Kindly inform the patient had MRI scan of the brain showed no new or worrisome findings. Mild changes of hardening of the arteries in the brain and slight shrinkage towards the back. Chronic occlusion of the left vertebral artery which is an old and known findings unchanged since 2014

## 2016-11-02 ENCOUNTER — Other Ambulatory Visit (INDEPENDENT_AMBULATORY_CARE_PROVIDER_SITE_OTHER): Payer: PRIVATE HEALTH INSURANCE

## 2016-11-02 ENCOUNTER — Encounter: Payer: Self-pay | Admitting: Internal Medicine

## 2016-11-02 ENCOUNTER — Ambulatory Visit (INDEPENDENT_AMBULATORY_CARE_PROVIDER_SITE_OTHER): Payer: PRIVATE HEALTH INSURANCE | Admitting: Internal Medicine

## 2016-11-02 DIAGNOSIS — Z8673 Personal history of transient ischemic attack (TIA), and cerebral infarction without residual deficits: Secondary | ICD-10-CM | POA: Diagnosis not present

## 2016-11-02 DIAGNOSIS — R7301 Impaired fasting glucose: Secondary | ICD-10-CM | POA: Diagnosis not present

## 2016-11-02 LAB — HEMOGLOBIN A1C: Hgb A1c MFr Bld: 7 % — ABNORMAL HIGH (ref 4.6–6.5)

## 2016-11-02 MED ORDER — CLOPIDOGREL BISULFATE 75 MG PO TABS: 75.0000 mg | ORAL_TABLET | Freq: Every day | ORAL | 3 refills | Status: DC

## 2016-11-02 MED ORDER — ATORVASTATIN CALCIUM 10 MG PO TABS: 10.0000 mg | ORAL_TABLET | Freq: Every day | ORAL | 3 refills | Status: DC

## 2016-11-02 MED ORDER — AMLODIPINE BESYLATE 5 MG PO TABS: 5.0000 mg | ORAL_TABLET | Freq: Every day | ORAL | 3 refills | Status: DC

## 2016-11-02 NOTE — Patient Instructions (Signed)
We will check the sugars today and call you back with the results.   Keep working on cutting back on sugary foods and beverages. A good goal is to stay the same weight during the holidays as most people will gain 3-4 pounds.

## 2016-11-02 NOTE — Assessment & Plan Note (Signed)
Needs HgA1c and does not wish to develop diabetes. We talked about staying weight neutral during the holidays and still working on losing a few pounds.

## 2016-11-02 NOTE — Progress Notes (Signed)
Pre visit review using our clinic review tool, if applicable. No additional management support is needed unless otherwise documented below in the visit note. 

## 2016-11-02 NOTE — Progress Notes (Signed)
   Subjective:    Patient ID: Curtis Lam, male    DOB: 1961-12-20, 55 y.o.   MRN: [id number]  HPI The patient is a 55 YO man coming in for follow up of his sugars. He is not doing much exercise recently but is trying to work on diet some although he does struggle. He has had a stroke in the past and is concerned about getting diabetes as this would raise his risk more. Denies new weakness, numbness, chest pains, SOB, headaches. Still taking cholesterol medicine, blood pressure and plavix daily.   Review of Systems  Constitutional: Negative for activity change, appetite change, fatigue, fever and unexpected weight change.  Respiratory: Negative.   Cardiovascular: Negative.   Gastrointestinal: Negative.   Musculoskeletal: Negative.   Neurological: Negative.       Objective:   Physical Exam  Constitutional: He is oriented to person, place, and time. He appears well-developed and well-nourished.  HENT:  Head: Normocephalic and atraumatic.  Eyes: EOM are normal.  Neck: Normal range of motion.  Cardiovascular: Normal rate and regular rhythm.   Pulmonary/Chest: Effort normal and breath sounds normal.  Abdominal: Soft.  Neurological: He is alert and oriented to person, place, and time. Coordination normal.  Skin: Skin is warm and dry.   Vitals:   11/02/16 1544  BP: 136/80  Pulse: 70  Resp: 18  Temp: 98.3 F (36.8 C)  TempSrc: Oral  SpO2: 98%  Weight: 197 lb (89.4 kg)  Height: 5\' 7"  (1.702 m)      Assessment & Plan:

## 2016-11-02 NOTE — Assessment & Plan Note (Signed)
Checking HgA1c for risk for future stroke.

## 2016-11-09 ENCOUNTER — Ambulatory Visit: Payer: No Typology Code available for payment source | Admitting: Nurse Practitioner

## 2017-03-03 IMAGING — CT CT CHEST W/ CM
2 of 4 series · 15 of 36 positions shown, 18 images · IV contrast (iopamidol)
Comparison: 06/08/2013

CLINICAL DATA: Follow-up pulmonary nodule from 6966

EXAM:
CT CHEST WITH CONTRAST
TECHNIQUE: Multidetector CT imaging of the chest was performed during
intravenous contrast administration.
CONTRAST:  75mL UQUSE1-HEE IOPAMIDOL (UQUSE1-HEE) INJECTION 61%

[Series 2: chest w/cm · axial · 0.72mm/px · z∈[+638,+880]mm · 12 of 145 slices shown, 15 images]
[im 12/145  mediastinal]
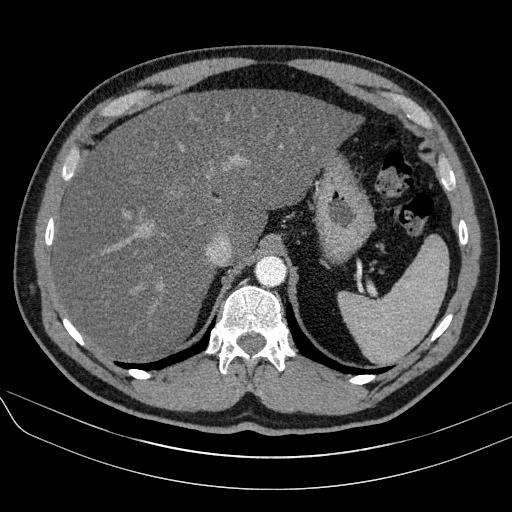
[im 12/145  lung]
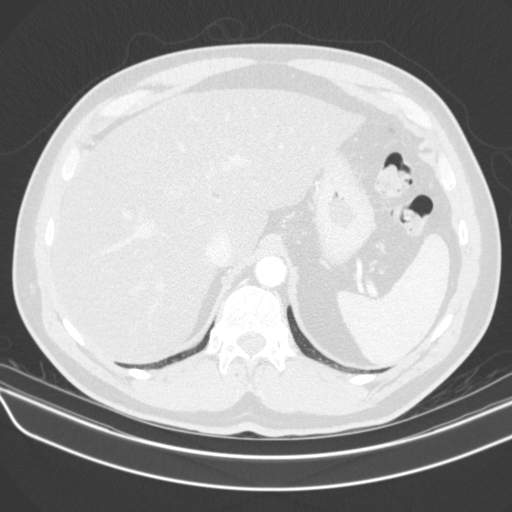
[im 23/145  lung]
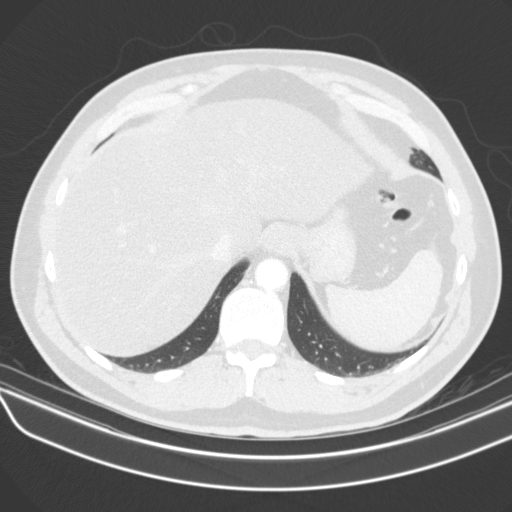
[im 34/145  lung]
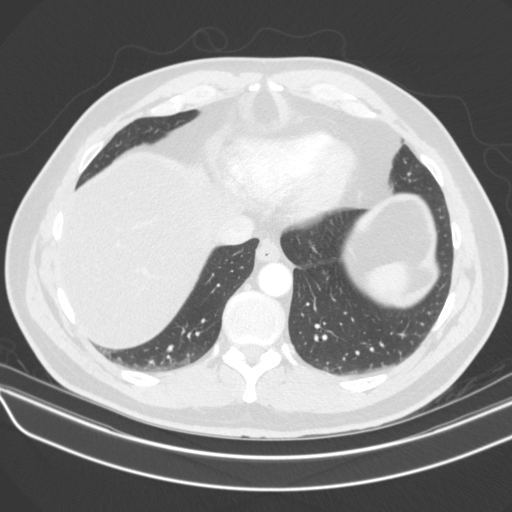
[im 45/145  lung]
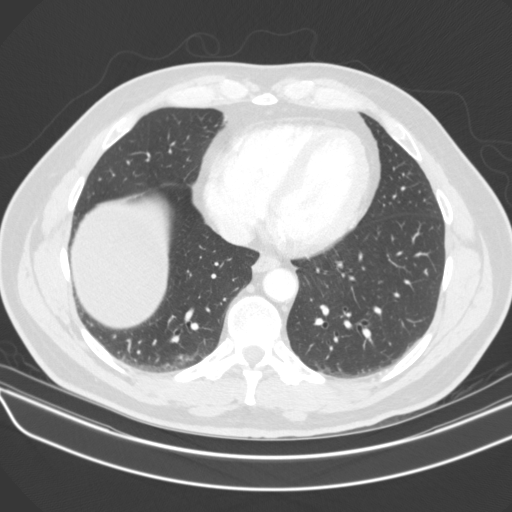
[im 56/145  mediastinal]
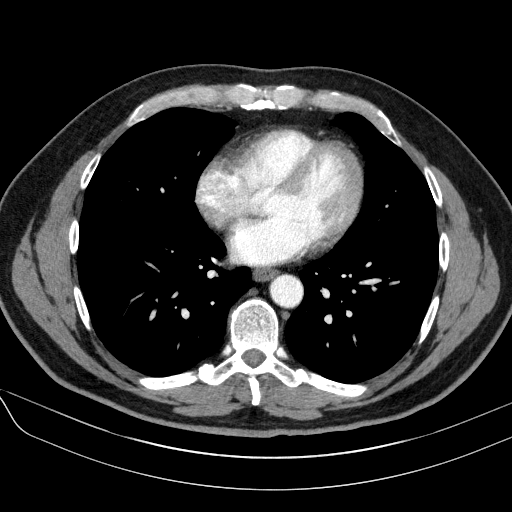
[im 56/145  lung]
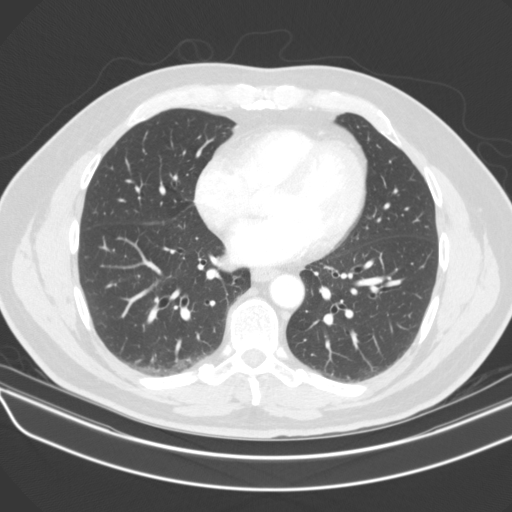
[im 67/145  lung]
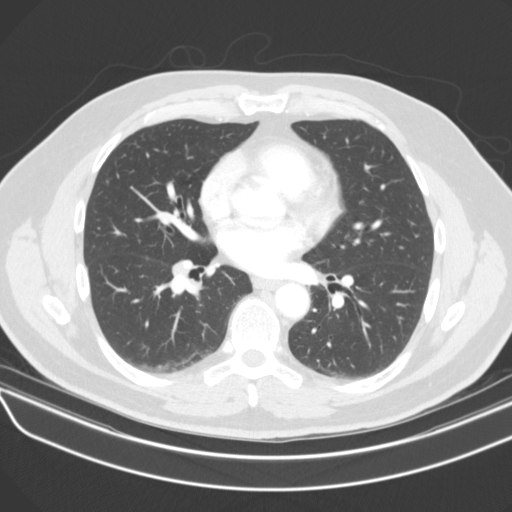
[im 78/145  lung]
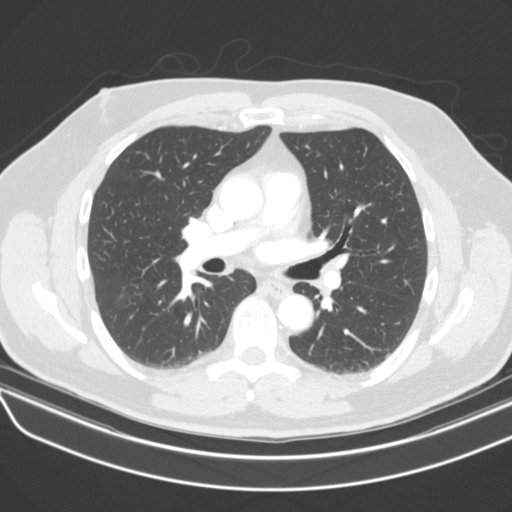
[im 89/145  lung]
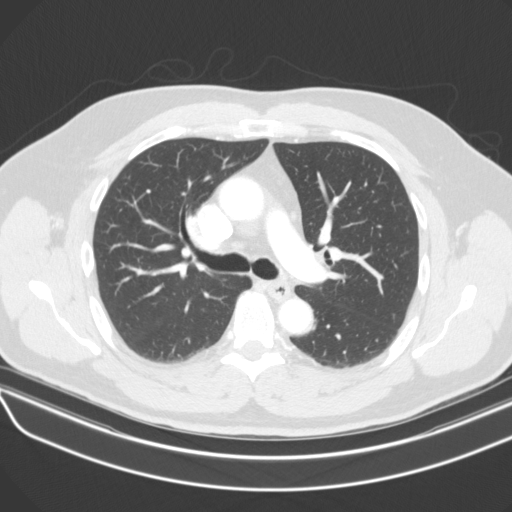
[im 100/145  mediastinal]
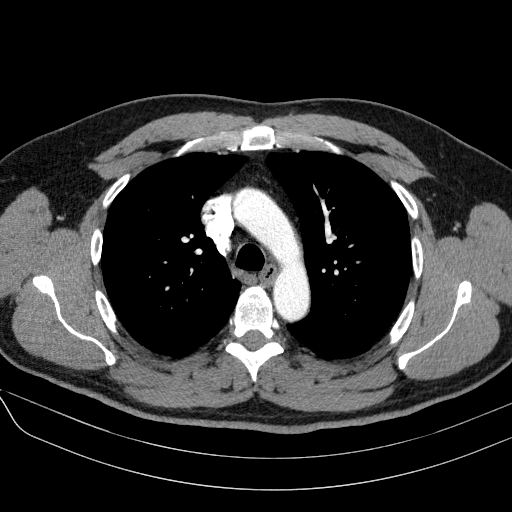
[im 100/145  lung]
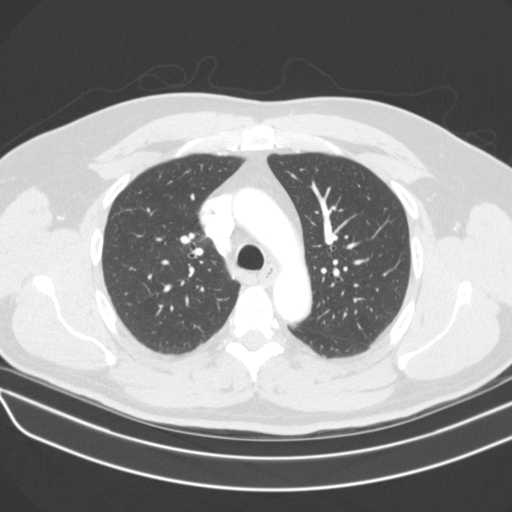
[im 111/145  lung]
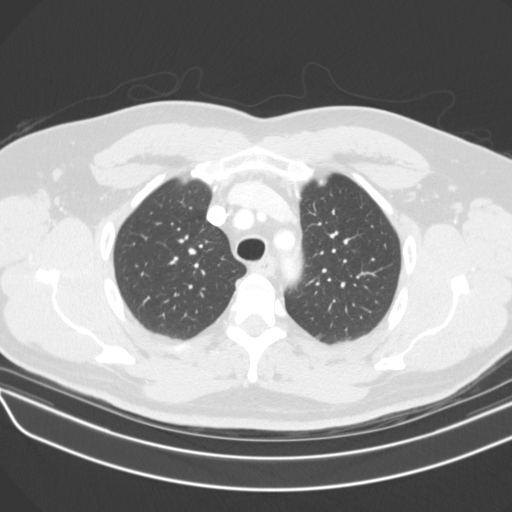
[im 122/145  lung]
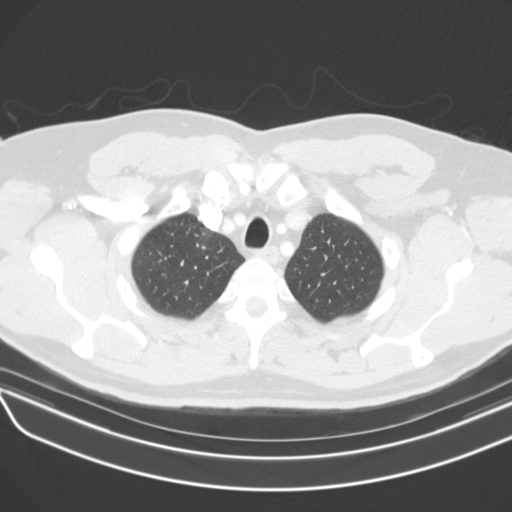
[im 133/145  lung]
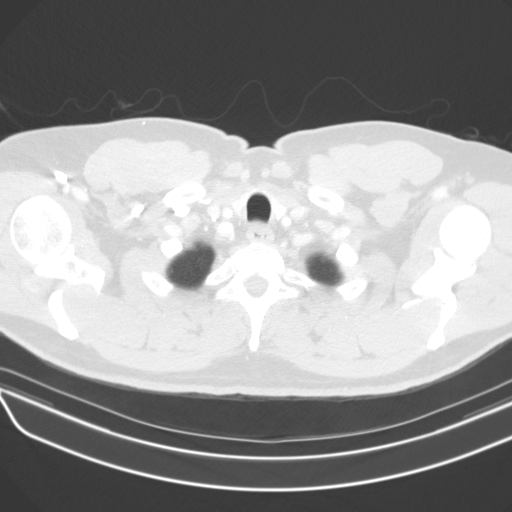

[Series 3: cor · coronal · 0.59mm/px · 3 of 128 slices shown]
[im 26/128  lung]
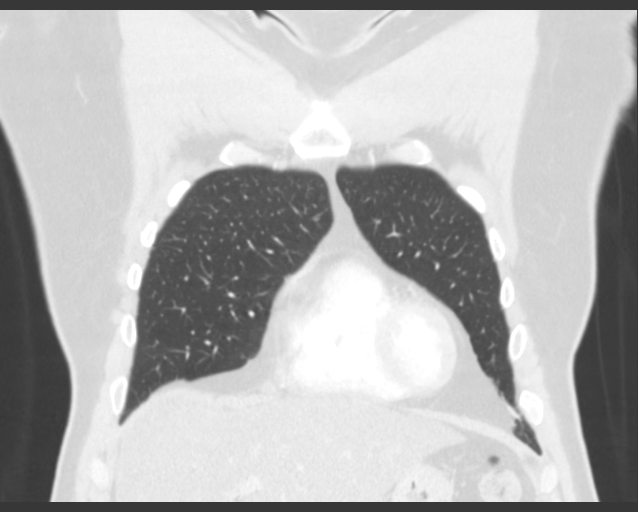
[im 51/128  lung]
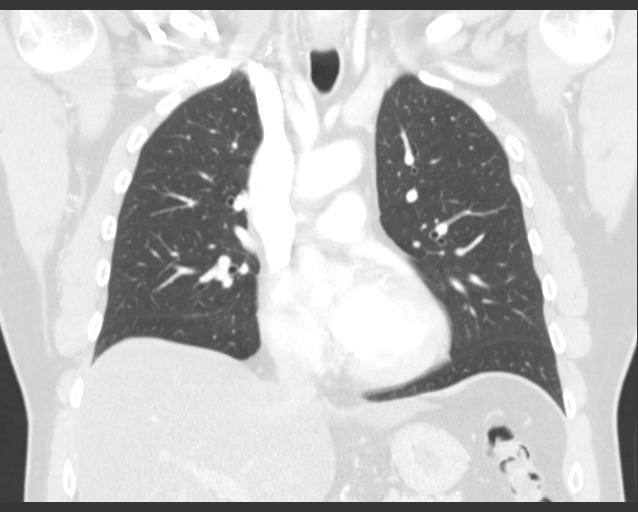
[im 77/128  lung]
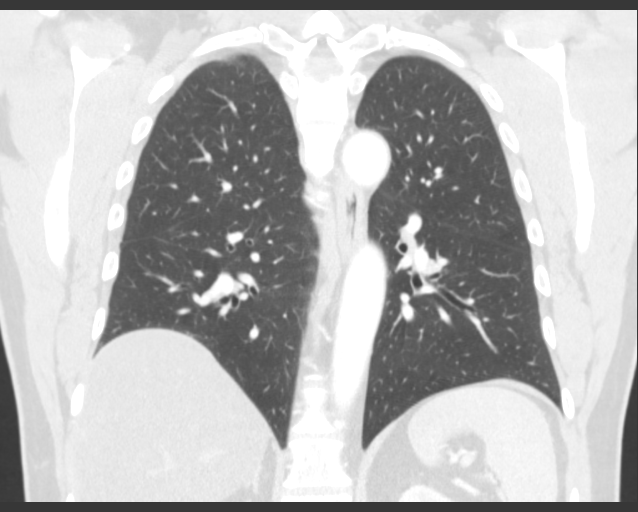

[15 of 36 positions shown; findings below may reference images not displayed]

FINDINGS: Cardiovascular: The thoracic aorta and its branches demonstrates
some mild atherosclerotic calcifications although no aneurysmal
dilatation or dissection is seen. The cardiac structures are within
normal limits. Mild coronary calcifications are seen. Pulmonary
artery as visualized is within normal limits.

Mediastinum/Nodes: The thoracic inlet is within normal limits. No
significant hilar or mediastinal adenopathy is noted. No axillary
adenopathy is seen.

Lungs/Pleura: The lungs are well aerated bilaterally. No focal
infiltrate or sizable effusion is seen. A 1.6 x 1.1 cm nodule is
noted in the posterior aspect of the left lower lobe best seen on
image number 74 of series 5. This is stable from the prior exam it
is demonstrated interval increase calcification most consistent with
a calcified granuloma. The right middle lobe nodule is also stable
in appearance from the prior exam measuring approximately 5 mm. No
other sizable nodules are seen.

Upper Abdomen: The visualized upper abdomen shows fatty infiltration
of the liver. No other focal abnormality is seen.

Musculoskeletal: No acute bony abnormality is noted.
IMPRESSION: Stable pulmonary nodules bilaterally. Given their stability over the
past 3+ years, they are considered benign. No further follow-up is
necessary.

Fatty liver.

No other focal abnormality is noted.

## 2017-04-05 ENCOUNTER — Other Ambulatory Visit (INDEPENDENT_AMBULATORY_CARE_PROVIDER_SITE_OTHER): Payer: PRIVATE HEALTH INSURANCE

## 2017-04-05 ENCOUNTER — Ambulatory Visit (INDEPENDENT_AMBULATORY_CARE_PROVIDER_SITE_OTHER)
Admission: RE | Admit: 2017-04-05 | Discharge: 2017-04-05 | Disposition: A | Discharge status: Home / Self Care | Payer: PRIVATE HEALTH INSURANCE | Source: Ambulatory Visit | Attending: Internal Medicine | Admitting: Internal Medicine

## 2017-04-05 ENCOUNTER — Ambulatory Visit (INDEPENDENT_AMBULATORY_CARE_PROVIDER_SITE_OTHER): Payer: PRIVATE HEALTH INSURANCE | Admitting: Internal Medicine

## 2017-04-05 ENCOUNTER — Encounter: Payer: Self-pay | Admitting: Internal Medicine

## 2017-04-05 ENCOUNTER — Other Ambulatory Visit: Payer: Self-pay | Admitting: Internal Medicine

## 2017-04-05 VITALS — BP 144/80 | HR 81 | Temp 98.0°F | Resp 12 | Ht 67.0 in | Wt 184.0 lb

## 2017-04-05 DIAGNOSIS — M549 Dorsalgia, unspecified: Secondary | ICD-10-CM | POA: Insufficient documentation

## 2017-04-05 DIAGNOSIS — E119 Type 2 diabetes mellitus without complications: Secondary | ICD-10-CM

## 2017-04-05 DIAGNOSIS — I10 Essential (primary) hypertension: Secondary | ICD-10-CM | POA: Diagnosis not present

## 2017-04-05 DIAGNOSIS — M5430 Sciatica, unspecified side: Secondary | ICD-10-CM

## 2017-04-05 DIAGNOSIS — Z8673 Personal history of transient ischemic attack (TIA), and cerebral infarction without residual deficits: Secondary | ICD-10-CM

## 2017-04-05 LAB — COMPREHENSIVE METABOLIC PANEL
ALT: 55 U/L — ABNORMAL HIGH (ref 0–53)
AST: 31 U/L (ref 0–37)
Albumin: 4.7 g/dL (ref 3.5–5.2)
Alkaline Phosphatase: 64 U/L (ref 39–117)
BILIRUBIN TOTAL: 0.6 mg/dL (ref 0.2–1.2)
BUN: 14 mg/dL (ref 6–23)
CALCIUM: 9.8 mg/dL (ref 8.4–10.5)
CHLORIDE: 103 meq/L (ref 96–112)
CO2: 26 mEq/L (ref 19–32)
Creatinine, Ser: 0.99 mg/dL (ref 0.40–1.50)
GFR: 82.48 mL/min (ref >60.00)
GLUCOSE: 368 mg/dL — AB (ref 70–99)
POTASSIUM: 4.5 meq/L (ref 3.5–5.1)
Sodium: 137 mEq/L (ref 135–145)
Total Protein: 7.6 g/dL (ref 6.0–8.3)

## 2017-04-05 LAB — CBC
HEMATOCRIT: 50.3 % (ref 39.0–52.0)
Hemoglobin: 17.4 g/dL — ABNORMAL HIGH (ref 13.0–17.0)
MCHC: 34.7 g/dL (ref 30.0–36.0)
MCV: 91.5 fl (ref 78.0–100.0)
PLATELETS: 166 10*3/uL (ref 150.0–400.0)
RBC: 5.5 Mil/uL (ref 4.22–5.81)
RDW: 12.3 % (ref 11.5–15.5)
WBC: 5.2 10*3/uL (ref 4.0–10.5)

## 2017-04-05 LAB — LIPID PANEL
CHOL/HDL RATIO: 7
CHOLESTEROL: 232 mg/dL — AB (ref 0–200)
HDL: 35.4 mg/dL — ABNORMAL LOW (ref >39.00)
NONHDL: 196.51
TRIGLYCERIDES: 242 mg/dL — AB (ref 0.0–149.0)
VLDL: 48.4 mg/dL — AB (ref 0.0–40.0)

## 2017-04-05 LAB — LDL CHOLESTEROL, DIRECT: Direct LDL: 137 mg/dL

## 2017-04-05 LAB — HEMOGLOBIN A1C: Hgb A1c MFr Bld: 11.8 % — ABNORMAL HIGH (ref 4.6–6.5)

## 2017-04-05 MED ORDER — GLUCOSE BLOOD VI STRP: ORAL_STRIP | 12 refills | Status: DC

## 2017-04-05 MED ORDER — AMLODIPINE BESYLATE 10 MG PO TABS: 10.0000 mg | ORAL_TABLET | Freq: Every day | ORAL | 3 refills | Status: DC

## 2017-04-05 MED ORDER — METFORMIN HCL 500 MG PO TABS: 500.0000 mg | ORAL_TABLET | Freq: Two times a day (BID) | ORAL | 3 refills | Status: DC

## 2017-04-05 NOTE — Assessment & Plan Note (Signed)
Checking x-ray thoracic spine as lumbar was unrevealing and he is having worsening right leg weakness and back pain. If no answers will refer to neurosurgery.

## 2017-04-05 NOTE — Patient Instructions (Signed)
Start taking a baby aspirin 81 mg daily to help prevent stroke.   We are checking the sugars and cholesterol today and will call you back with results.  We have sent in the higher dose of blood pressure medicine. Take 2 pills daily of the amlodipine you have until you run out.   We have also sent in the medicine for the sugars called metformin. Take 1 pill daily for the first week, then increase to 1 pill twice a day and stay there. The most common side effect is loose stools or nausea and typically only lasts 1-2 weeks if at all. If it is severe or does not get better stop taking and let us know so we can change medicines.   We are checking the x-ray of the back and will get you in with the nutritionist about the new diabetes.   Diabetes Mellitus and Food It is important for you to manage your blood sugar (glucose) level. Your blood glucose level can be greatly affected by what you eat. Eating healthier foods in the appropriate amounts throughout the day at about the same time each day will help you control your blood glucose level. It can also help slow or prevent worsening of your diabetes mellitus. Healthy eating may even help you improve the level of your blood pressure and reach or maintain a healthy weight. General recommendations for healthful eating and cooking habits include:  Eating meals and snacks regularly. Avoid going long periods of time without eating to lose weight.  Eating a diet that consists mainly of plant-based foods, such as fruits, vegetables, nuts, legumes, and whole grains.  Using low-heat cooking methods, such as baking, instead of high-heat cooking methods, such as deep frying. Work with your dietitian to make sure you understand how to use the Nutrition Facts information on food labels. How can food affect me? Carbohydrates  Carbohydrates affect your blood glucose level more than any other type of food. Your dietitian will help you determine how many carbohydrates  to eat at each meal and teach you how to count carbohydrates. Counting carbohydrates is important to keep your blood glucose at a healthy level, especially if you are using insulin or taking certain medicines for diabetes mellitus. Alcohol  Alcohol can cause sudden decreases in blood glucose (hypoglycemia), especially if you use insulin or take certain medicines for diabetes mellitus. Hypoglycemia can be a life-threatening condition. Symptoms of hypoglycemia (sleepiness, dizziness, and disorientation) are similar to symptoms of having too much alcohol. If your health care provider has given you approval to drink alcohol, do so in moderation and use the following guidelines:  Women should not have more than one drink per day, and men should not have more than two drinks per day. One drink is equal to:  12 oz of beer.  5 oz of wine.  1 oz of hard liquor.  Do not drink on an empty stomach.  Keep yourself hydrated. Have water, diet soda, or unsweetened iced tea.  Regular soda, juice, and other mixers might contain a lot of carbohydrates and should be counted. What foods are not recommended? As you make food choices, it is important to remember that all foods are not the same. Some foods have fewer nutrients per serving than other foods, even though they might have the same number of calories or carbohydrates. It is difficult to get your body what it needs when you eat foods with fewer nutrients. Examples of foods that you should avoid that are  high in calories and carbohydrates but low in nutrients include:  Trans fats (most processed foods list trans fats on the Nutrition Facts label).  Regular soda.  Juice.  Candy.  Sweets, such as cake, pie, doughnuts, and cookies.  Fried foods. What foods can I eat? Eat nutrient-rich foods, which will nourish your body and keep you healthy. The food you should eat also will depend on several factors, including:  The calories you need.  The  medicines you take.  Your weight.  Your blood glucose level.  Your blood pressure level.  Your cholesterol level. You should eat a variety of foods, including:  Protein.  Lean cuts of meat.  Proteins low in saturated fats, such as fish, egg whites, and beans. Avoid processed meats.  Fruits and vegetables.  Fruits and vegetables that may help control blood glucose levels, such as apples, mangoes, and yams.  Dairy products.  Choose fat-free or low-fat dairy products, such as milk, yogurt, and cheese.  Grains, bread, pasta, and rice.  Choose whole grain products, such as multigrain bread, whole oats, and brown rice. These foods may help control blood pressure.  Fats.  Foods containing healthful fats, such as nuts, avocado, olive oil, canola oil, and fish. Does everyone with diabetes mellitus have the same meal plan? Because every person with diabetes mellitus is different, there is not one meal plan that works for everyone. It is very important that you meet with a dietitian who will help you create a meal plan that is just right for you. This information is not intended to replace advice given to you by your health care provider. Make sure you discuss any questions you have with your health care provider. Document Released: 09/03/2005 Document Revised: 05/14/2016 Document Reviewed: 11/03/2013 Elsevier Interactive Patient Education  2017 [employer].   Diabetes Mellitus and Standards of Medical Care Managing diabetes (diabetes mellitus) can be complicated. Your diabetes treatment may be managed by a team of health care providers, including:  A diet and nutrition specialist (registered dietitian).  A nurse.  A certified diabetes educator (CDE).  A diabetes specialist (endocrinologist).  An eye doctor.  A primary care provider.  A dentist. Your health care providers follow a schedule in order to help you get the best quality of care. The following schedule is a  general guideline for your diabetes management plan. Your health care providers may also give you more specific instructions. HbA1c ( hemoglobin A1c) test This test provides information about blood sugar (glucose) control over the previous 2-3 months. It is used to check whether your diabetes management plan needs to be adjusted.  If you are meeting your treatment goals, this test is done at least 2 times a year.  If you are not meeting treatment goals or if your treatment goals have changed, this test is done 4 times a year. Blood pressure test  This test is done at every routine medical visit. For most people, the goal is less than 130/80. Ask your health care provider what your goal blood pressure should be. Dental and eye exams  Visit your dentist two times a year.  If you have type 1 diabetes, get an eye exam 3-5 years after you are diagnosed, and then once a year after your first exam.  If you were diagnosed with type 1 diabetes as a child, get an eye exam when you are age 17 or older and have had diabetes for 3-5 years. After the first exam, you should get an  eye exam once a year.  If you have type 2 diabetes, have an eye exam as soon as you are diagnosed, and then once a year after your first exam. Foot care exam  Visual foot exams are done at every routine medical visit. The exams check for cuts, bruises, redness, blisters, sores, or other problems with the feet.  A complete foot exam is done by your health care provider once a year. This exam includes an inspection of the structure and skin of your feet, and a check of the pulses and sensation in your feet.  Type 1 diabetes: Get your first exam 3-5 years after diagnosis.  Type 2 diabetes: Get your first exam as soon as you are diagnosed.  Check your feet every day for cuts, bruises, redness, blisters, or sores. If you have any of these or other problems that are not healing, contact your health care provider. Kidney function  test ( urine microalbumin)  This test is done once a year.  Type 1 diabetes: Get your first test 5 years after diagnosis.  Type 2 diabetes: Get your first test as soon as you are diagnosed.  If you have chronic kidney disease (CKD), get a serum creatinine and estimated glomerular filtration rate (eGFR) test once a year. Lipid profile (cholesterol, HDL, LDL, triglycerides)  This test should be done when you are diagnosed with diabetes, and every 5 years after the first test. If you are on medicines to lower your cholesterol, you may need to get this test done every year.  The goal for LDL is less than 100 mg/dL (5.5 mmol/L). If you are at high risk, the goal is less than 70 mg/dL (3.9 mmol/L).    The goal for triglycerides is less than 150 mg/dL (8.3 mmol/L). Immunizations  The yearly flu (influenza) vaccine is recommended for everyone 6 months or older who has diabetes.  The pneumonia (pneumococcal) vaccine is recommended for everyone 2 years or older who has diabetes. If you are 94 or older, you may get the pneumonia vaccine as a series of two separate shots.  The hepatitis B vaccine is recommended for adults shortly after they have been diagnosed with diabetes.     Mental and emotional health  Screening for symptoms of eating disorders, anxiety, and depression is recommended at the time of diagnosis and afterward as needed. If your screening shows that you have symptoms (you have a positive screening result), you may need further evaluation and be referred to a mental health care provider. Diabetes self-management education  Education about how to manage your diabetes is recommended at diagnosis and ongoing as needed. Treatment plan  Your treatment plan will be reviewed at every medical visit. Summary  Managing diabetes (diabetes mellitus) can be complicated. Your diabetes treatment may be managed by a team of health care providers.  Your health care providers follow a  schedule in order to help you get the best quality of care.  Standards of care including having regular physical exams, blood tests, blood pressure monitoring, immunizations, screening tests, and education about how to manage your diabetes.  Your health care providers may also give you more specific instructions based on your individual health. This information is not intended to replace advice given to you by your health care provider. Make sure you discuss any questions you have with your health care provider. Document Released: 10/04/2009 Document Revised: 09/04/2016 Document Reviewed: 09/04/2016 Elsevier Interactive Patient Education  2017 [employer].

## 2017-04-05 NOTE — Assessment & Plan Note (Addendum)
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 Checking HgA1c today, not on meds. Home readings of around 300 fasting which is too high. Rx for metformin 500 mg BID and adjust based on HgA1c. Referral to nutrition for education. Weight down about 10 pounds since last visit due to sugar induced diuresis.

## 2017-04-05 NOTE — Progress Notes (Signed)
   Subjective:    Patient ID: Curtis Lam, male    DOB: 07-Oct-1961, 56 y.o.   MRN: [id number]  HPI The patient is a 56 YO man coming in for problems with his sugars. He was diagnosed with diabetes after his last office visit about 5 months ago and elected to work with diet and exercise to keep sugars down. Denies diet changes recently but overall has poor diet with cokes and fast food almost daily. He has lost about 13 pounds since last visit unintentionally in the last 1-2 months with the increase in thirst and urination. He bought a meter and started checking sugars 3 days ago and average is 300 fasting in the morning. Since last visit he has also stopped taking his cholesterol medicine due to right leg pain and plavix due to nosebleed.   In addition he is having some headaches and BP running high at home. He is taking the amlodipine at home but still running high. Also in the last several months the blood pressure is higher.   He is still having pain in his back and is worried about this as the pain in his right leg is worsening and more instability with it collapsing on him.   Review of Systems  Constitutional: Positive for activity change, appetite change, fatigue and unexpected weight change. Negative for chills and fever.  HENT: Negative.   Respiratory: Negative for cough, chest tightness, shortness of breath and wheezing.   Cardiovascular: Negative.   Gastrointestinal: Negative for abdominal distention, abdominal pain, constipation, diarrhea and nausea.  Endocrine: Positive for polydipsia and polyuria. Negative for cold intolerance and heat intolerance.  Genitourinary: Positive for frequency and urgency.  Musculoskeletal: Positive for back pain and gait problem. Negative for arthralgias, joint swelling and myalgias.  Skin: Negative.   Neurological: Positive for weakness. Negative for dizziness, seizures, facial asymmetry, speech difficulty and headaches.  Psychiatric/Behavioral:  Negative.       Objective:   Physical Exam  Constitutional: He is oriented to person, place, and time. He appears well-developed and well-nourished.  HENT:  Head: Normocephalic and atraumatic.  Eyes: EOM are normal.  Neck: Normal range of motion. No JVD present.  Cardiovascular: Normal rate and regular rhythm.   Pulmonary/Chest: Effort normal. No respiratory distress. He has no wheezes. He has no rales.  Abdominal: Soft. He exhibits no distension. There is no tenderness. There is no rebound.  Musculoskeletal: He exhibits no edema.  Lymphadenopathy:    He has no cervical adenopathy.  Neurological: He is alert and oriented to person, place, and time. Coordination abnormal.  Some instability of right leg  Skin: Skin is warm and dry.   Vitals:   04/05/17 1029 04/05/17 1109  BP: (!) 150/100 (!) 144/80  Pulse: 81   Resp: 12   Temp: 98 F (36.7 C)   TempSrc: Oral   SpO2: 99%   Weight: 184 lb (83.5 kg)   Height:  (1.702 m)       Assessment & Plan:

## 2017-04-05 NOTE — Assessment & Plan Note (Signed)
BP above goal for prior stroke and needs increase in therapy. Increase amlodipine 5 mg to 10 mg daily. On repeat visit will add agent if not controlled.

## 2017-04-05 NOTE — Assessment & Plan Note (Signed)
Stopped taking statin and plavix since last visit. He does not want to resume plavix but agrees to aspirin 81 mg daily. He wants to check cholesterol before resuming medication. Reminded him that this does decrease his risk of recurrence stroke as well the new diabetes increases his risk of stroke and CAD as well.

## 2017-04-05 NOTE — Progress Notes (Signed)
Pre visit review using our clinic review tool, if applicable. No additional management support is needed unless otherwise documented below in the visit note. 

## 2017-04-08 ENCOUNTER — Encounter: Payer: Self-pay | Admitting: Internal Medicine

## 2017-04-08 ENCOUNTER — Telehealth: Payer: Self-pay | Admitting: Internal Medicine

## 2017-04-08 MED ORDER — ROSUVASTATIN CALCIUM 20 MG PO TABS: 20.0000 mg | ORAL_TABLET | Freq: Every day | ORAL | 3 refills | Status: DC

## 2017-04-08 NOTE — Telephone Encounter (Signed)
patient contacted and aware 

## 2017-04-08 NOTE — Telephone Encounter (Signed)
Patient has send a my chart message requesting pneumonia vac, opthalmology referral and micro albumin.  Patient would like to know if this can be done on his next follow up appt.  I will add to notes on follow up.  Just FYI.   He has also included this message: Also I misunderstood your direction regarding the Metformin, I started off with one in the morning and one in the evening vs one per day the first week - hope that isn't bad.

## 2017-04-08 NOTE — Telephone Encounter (Signed)
That is fine, if he has diarrhea back down to 1 pill daily and then gradually increase to 1 pill twice a day. After 2 weeks on 1 pill twice a day he can increase to 2 pills in the morning, 1 pill in the evening for 1 week then increase to 2 pills twice a day if not having diarrhea.

## 2017-05-31 ENCOUNTER — Encounter: Payer: Self-pay | Admitting: Registered"

## 2017-05-31 ENCOUNTER — Encounter: Payer: No Typology Code available for payment source | Attending: Internal Medicine | Admitting: Registered"

## 2017-05-31 DIAGNOSIS — Z713 Dietary counseling and surveillance: Secondary | ICD-10-CM | POA: Diagnosis not present

## 2017-05-31 DIAGNOSIS — E119 Type 2 diabetes mellitus without complications: Secondary | ICD-10-CM | POA: Diagnosis not present

## 2017-05-31 NOTE — Patient Instructions (Addendum)
Plan:  Great job on the diet changes you have made so far! Aim for 3-4 Carb Choices per meal (45-60 grams) Aim for 0-2 Carbs per snack if hungry  Include protein with your meals and snacks Consider reading food labels for Total Carbohydrate Consider increasing your activity level daily as tolerated Consider checking BG at alternate times per day as directed by MD Get one fasting number and another 2 hours after a meal  Continue taking medication as directed by MD

## 2017-05-31 NOTE — Progress Notes (Signed)
Diabetes Self-Management Education  Visit Type: First/Initial  Appt. Start Time: 1645 Appt. End Time: 1705  05/31/2017  Curtis Lam, identified by name and date of birth, is a 56 y.o. male with a diagnosis of Diabetes: Type 2.   ASSESSMENT This patient is accompanied in the office by his spouse. Patient states he has been pre-diabetic for a while and last year his A1c was still below 7. Patient and spouse have had life extra stress the last couple of years which has affect patients sleep quality. However, patient and spouse report that since their changes over the last couple of months he is feeling better and has more energy.  Patient reports that he usually doesn't eat lunch at work, but plans to get into more of a habit once he gets a small fridge in his office. Patient states he has changed some of his dietary habits by cutting back on fast food, soda and pastries.      Diabetes Self-Management Education - 05/31/17 1545      Visit Information   Visit Type First/Initial     Initial Visit   Diabetes Type Type 2   Are you currently following a meal plan? No   Are you taking your medications as prescribed? Yes   Date Diagnosed about 3 months ago     Health Coping   How would you rate your overall health? Good     Psychosocial Assessment   Patient Belief/Attitude about Diabetes Motivated to manage diabetes   How often do you need to have someone help you when you read instructions, pamphlets, or other written materials from your doctor or pharmacy? 1 - Never   What is the last grade level you completed in school? 12th     Complications   Last HgB A1C per patient/outside source 11.5 %  03/2017   How often do you check your blood sugar? 1-2 times/day   Fasting Blood glucose range (mg/dL) --  [phone]   Can you tell when your blood sugar is low? Yes   Number of hyperglycemic episodes per week 1   Can you tell when your blood sugar is high? Yes   What do you do if your  blood sugar is high? drink water   Have you had a dilated eye exam in the past 12 months? No   Have you had a dental exam in the past 12 months? Yes   Are you checking your feet? Yes   How many days per week are you checking your feet? 1     Dietary Intake   Breakfast pickeled eggs & beets, coffee, cream OR    3-4 am   Snack (morning) none    Lunch none   Snack (afternoon) rice pouch, seeds of change   Dinner hamburger, multi-grain, cheese, peanuts, chips, guacomole, snapple peach    Beverage(s) water with lemon, diluted tea,      Exercise   Exercise Type ADL's   How many days per week to you exercise? 0   How many minutes per day do you exercise? 0   Total minutes per week of exercise 0     Patient Education   Previous Diabetes Education No   Disease state  Definition of diabetes, type 1 and 2, and the diagnosis of diabetes;Factors that contribute to the development of diabetes   Nutrition management  Role of diet in the treatment of diabetes and the relationship between the three main macronutrients and blood glucose level;Food label  reading, portion sizes and measuring food.;Carbohydrate counting   Physical activity and exercise  Role of exercise on diabetes management, blood pressure control and cardiac health.   Monitoring Identified appropriate SMBG and/or A1C goals.;Purpose and frequency of SMBG.   Acute complications Taught treatment of hypoglycemia - the 15 rule.;Discussed and identified patients' treatment of hyperglycemia.   Chronic complications Relationship between chronic complications and blood glucose control;Assessed and discussed foot care and prevention of foot problems;Lipid levels, blood glucose control and heart disease;Identified and discussed with patient  current chronic complications;Retinopathy and reason for yearly dilated eye exams   Psychosocial adjustment Role of stress on diabetes     Individualized Goals (developed by patient)   Nutrition Follow meal  plan discussed   Physical Activity Exercise 5-7 days per week     Outcomes   Expected Outcomes Demonstrated interest in learning. Expect positive outcomes   Future DMSE PRN   Program Status Completed     Individualized Plan for Diabetes Self-Management Training:   Learning Objective:  Patient will have a greater understanding of diabetes self-management. Patient education plan is to attend individual and/or group sessions per assessed needs and concerns.   Patient Instructions  Plan:  Randie Heinz job on the diet changes you have made so far! Aim for 3-4 Carb Choices per meal (45-60 grams) Aim for 0-2 Carbs per snack if hungry  Include protein with your meals and snacks Consider reading food labels for Total Carbohydrate Consider increasing your activity level daily as tolerated Consider checking BG at alternate times per day as directed by MD Get one fasting number and another 2 hours after a meal  Continue taking medication as directed by MD   Expected Outcomes:  Demonstrated interest in learning. Expect positive outcomes  Education material provided: Living Well with Diabetes, A1C conversion sheet, Support group flyer and Carbohydrate counting sheet  If problems or questions, patient to contact team via:  Phone  Future DSME appointment: PRN

## 2017-07-09 ENCOUNTER — Ambulatory Visit: Payer: PRIVATE HEALTH INSURANCE | Admitting: Family Medicine

## 2017-07-09 ENCOUNTER — Ambulatory Visit: Payer: PRIVATE HEALTH INSURANCE | Admitting: Nurse Practitioner

## 2017-07-09 ENCOUNTER — Ambulatory Visit: Payer: PRIVATE HEALTH INSURANCE | Admitting: Internal Medicine

## 2017-08-05 ENCOUNTER — Ambulatory Visit: Payer: PRIVATE HEALTH INSURANCE | Admitting: Nurse Practitioner

## 2017-08-06 ENCOUNTER — Ambulatory Visit (INDEPENDENT_AMBULATORY_CARE_PROVIDER_SITE_OTHER): Payer: PRIVATE HEALTH INSURANCE | Admitting: Nurse Practitioner

## 2017-08-06 ENCOUNTER — Encounter: Payer: Self-pay | Admitting: Nurse Practitioner

## 2017-08-06 VITALS — BP 146/82 | HR 69 | Temp 98.2°F | Ht 67.0 in | Wt 192.0 lb

## 2017-08-06 DIAGNOSIS — Z23 Encounter for immunization: Secondary | ICD-10-CM | POA: Diagnosis not present

## 2017-08-06 DIAGNOSIS — M549 Dorsalgia, unspecified: Secondary | ICD-10-CM | POA: Diagnosis not present

## 2017-08-06 DIAGNOSIS — E782 Mixed hyperlipidemia: Secondary | ICD-10-CM

## 2017-08-06 DIAGNOSIS — M5430 Sciatica, unspecified side: Secondary | ICD-10-CM | POA: Diagnosis not present

## 2017-08-06 DIAGNOSIS — E119 Type 2 diabetes mellitus without complications: Secondary | ICD-10-CM

## 2017-08-06 DIAGNOSIS — R200 Anesthesia of skin: Secondary | ICD-10-CM | POA: Diagnosis not present

## 2017-08-06 DIAGNOSIS — I10 Essential (primary) hypertension: Secondary | ICD-10-CM | POA: Diagnosis not present

## 2017-08-06 LAB — POCT UA - MICROALBUMIN
Albumin/Creatinine Ratio, Urine, POC: <30
Creatinine, POC: 200 mg/dL
Microalbumin Ur, POC: 10 mg/L

## 2017-08-06 LAB — POCT GLYCOSYLATED HEMOGLOBIN (HGB A1C)

## 2017-08-06 NOTE — Assessment & Plan Note (Signed)
I think right forefoot numbness is related to L4-5 radiculopathy. Need to follow up with neurology for possible MRI lumbar spine. Lumbar DG done 08/2016: There is mild facet joint narrowing on the right at L4-L5 and L5-S1 and on the left at L5-S1.

## 2017-08-06 NOTE — Progress Notes (Signed)
Subjective:  Patient ID: Curtis Lam, male    DOB: January 15, 1961  Age: 56 y.o. MRN: [id number]  CC: Follow-up (follow up/referral to eye doc?pneumonia shot?/med refills?)   HPI  DM home glucose: 130-140 No hypoglycemia. No side effects. Increase numbness in right fore foot x 2months. Chronic lower back pain with radiculopathy to right leg. Evaluated by [facility] neurology.  HTN: Controlled.  Outpatient Medications Prior to Visit  Medication Sig Dispense Refill  . amLODipine (NORVASC) 10 MG tablet Take 1 tablet (10 mg total) by mouth daily. 90 tablet 3  . clopidogrel (PLAVIX) 75 MG tablet Take 1 tablet (75 mg total) by mouth daily. 90 tablet 3  . glucose blood (BAYER CONTOUR TEST) test strip Use as instructed 100 each 12  . metFORMIN (GLUCOPHAGE) 500 MG tablet Take 1 tablet (500 mg total) by mouth 2 (two) times daily with a meal. 180 tablet 3  . ranitidine (ZANTAC) 150 MG capsule Take 150 mg by mouth 2 (two) times daily.    . rosuvastatin (CRESTOR) 20 MG tablet Take 1 tablet (20 mg total) by mouth daily. 90 tablet 3  . atorvastatin (LIPITOR) 10 MG tablet Take 1 tablet (10 mg total) by mouth daily. (Patient not taking: Reported on 04/05/2017) 90 tablet 3   No facility-administered medications prior to visit.     ROS See HPI  Objective:  BP (!) 146/82   Pulse 69   Temp 98.2 F (36.8 C)   Ht 5\' 7"  (1.702 m)   Wt 192 lb (87.1 kg)   SpO2 99%   BMI 30.07 kg/m   BP Readings from Last 3 Encounters:  08/06/17 (!) 146/82  04/05/17 (!) 144/80  11/02/16 136/80    Wt Readings from Last 3 Encounters:  08/06/17 192 lb (87.1 kg)  04/05/17 184 lb (83.5 kg)  11/02/16 197 lb (89.4 kg)    Physical Exam  Constitutional: He is oriented to person, place, and time. No distress.  Cardiovascular: Normal rate and intact distal pulses.   Pulmonary/Chest: Effort normal and breath sounds normal.  Musculoskeletal: Normal range of motion. He exhibits no edema, tenderness or deformity.    Neurological: He is alert and oriented to person, place, and time.  Abnormal diabetic foot exam  Skin: Skin is warm and dry. No rash noted. No erythema.  Vitals reviewed.   Lab Results  Component Value Date   WBC 5.2 04/05/2017   HGB 17.4 (H) 04/05/2017   HCT 50.3 04/05/2017   PLT 166.0 04/05/2017   GLUCOSE 368 (H) 04/05/2017   CHOL 232 (H) 04/05/2017   TRIG 242.0 (H) 04/05/2017   HDL 35.40 (L) 04/05/2017   LDLDIRECT 137.0 04/05/2017   LDLCALC 84 08/13/2016   ALT 55 (H) 04/05/2017   AST 31 04/05/2017   NA 137 04/05/2017   K 4.5 04/05/2017   CL 103 04/05/2017   CREATININE 0.99 04/05/2017   BUN 14 04/05/2017   CO2 26 04/05/2017   TSH 2.589 01/23/2015   PSA 0.60 01/23/2015   INR 0.98 05/31/2013   HGBA1C 6.7% 08/06/2017   MICROALBUR 10 08/06/2017    Dg Thoracic Spine W/swimmers  Result Date: 04/05/2017 CLINICAL DATA:  Chronic back pain, intermittent RIGHT leg weakness. EXAM: THORACIC SPINE - 3 VIEWS COMPARISON:  CT chest August 26, 2016 and CT angiogram of the neck August 22, 2013. FINDINGS: There is no evidence of thoracic spine fracture. Intervertebral disc heights generally preserved with mild endplate spurring. Alignment is normal. Scattered Schmorl's nodes better demonstrated on prior  imaging. No other significant bone abnormalities are identified. Calcified granuloma LEFT chest present on prior CT. Moderate C5-6 disc height loss and endplate spurring. IMPRESSION: Mild degenerative change of thoracic spine without acute fracture deformity or malalignment. Electronically Signed   By: Awilda Metro M.D.   On: 04/05/2017 13:59    Assessment & Plan:   Curtis Lam was seen today for follow-up.  Diagnoses and all orders for this visit:  Type 2 diabetes mellitus without complication, without long-term current use of insulin (HCC) -     POCT HgB A1C -     POCT UA - Microalbumin -     Basic metabolic panel; Future  Numbness of right foot  Essential hypertension -      Basic metabolic panel; Future  Back pain with sciatica  Mixed hyperlipidemia -     Lipid panel; Future  Need for vaccination with 13-polyvalent pneumococcal conjugate vaccine -     Pneumococcal conjugate vaccine 13-valent IM   I am having Curtis Lam maintain his atorvastatin, clopidogrel, ranitidine, glucose blood, amLODipine, metFORMIN, and rosuvastatin.  No orders of the defined types were placed in this encounter.   Follow-up: Return in about 6 months (around 02/06/2018) for DM and HTN, hyperlipidemia with Dr. Okey Dupre).  Alysia Penna, NP

## 2017-08-06 NOTE — Patient Instructions (Addendum)
Please follow up with [facility] neurology about right foot numbness and back pain with radiculopathy.  Return to lab for lipid panel and BMP next week (fasting at least 6-8hrs prior to blood draw)  Maintain current medications.  Have opthalmology report faxed to our office.  Diabetes and Foot Care Diabetes may cause you to have problems because of poor blood supply (circulation) to your feet and legs. This may cause the skin on your feet to become thinner, break easier, and heal more slowly. Your skin may become dry, and the skin may peel and crack. You may also have nerve damage in your legs and feet causing decreased feeling in them. You may not notice minor injuries to your feet that could lead to infections or more serious problems. Taking care of your feet is one of the most important things you can do for yourself. Follow these instructions at home:  Wear shoes at all times, even in the house. Do not go barefoot. Bare feet are easily injured.  Check your feet daily for blisters, cuts, and redness. If you cannot see the bottom of your feet, use a mirror or ask someone for help.  Wash your feet with warm water (do not use hot water) and mild soap. Then pat your feet and the areas between your toes until they are completely dry. Do not soak your feet as this can dry your skin.  Apply a moisturizing lotion or petroleum jelly (that does not contain alcohol and is unscented) to the skin on your feet and to dry, brittle toenails. Do not apply lotion between your toes.  Trim your toenails straight across. Do not dig under them or around the cuticle. File the edges of your nails with an emery board or nail file.  Do not cut corns or calluses or try to remove them with medicine.  Wear clean socks or stockings every day. Make sure they are not too tight. Do not wear knee-high stockings since they may decrease blood flow to your legs.  Wear shoes that fit properly and have enough cushioning. To  break in new shoes, wear them for just a few hours a day. This prevents you from injuring your feet. Always look in your shoes before you put them on to be sure there are no objects inside.  Do not cross your legs. This may decrease the blood flow to your feet.  If you find a minor scrape, cut, or break in the skin on your feet, keep it and the skin around it clean and dry. These areas may be cleansed with mild soap and water. Do not cleanse the area with peroxide, alcohol, or iodine.  When you remove an adhesive bandage, be sure not to damage the skin around it.  If you have a wound, look at it several times a day to make sure it is healing.  Do not use heating pads or hot water bottles. They may burn your skin. If you have lost feeling in your feet or legs, you may not know it is happening until it is too late.  Make sure your health care provider performs a complete foot exam at least annually or more often if you have foot problems. Report any cuts, sores, or bruises to your health care provider immediately. Contact a health care provider if:  You have an injury that is not healing.  You have cuts or breaks in the skin.  You have an ingrown nail.  You notice redness on your  legs or feet.  You feel burning or tingling in your legs or feet.  You have pain or cramps in your legs and feet.  Your legs or feet are numb.  Your feet always feel cold. Get help right away if:  There is increasing redness, swelling, or pain in or around a wound.  There is a red line that goes up your leg.  Pus is coming from a wound.  You develop a fever or as directed by your health care provider.  You notice a bad smell coming from an ulcer or wound. This information is not intended to replace advice given to you by your health care provider. Make sure you discuss any questions you have with your health care provider. Document Released: 12/04/2000 Document Revised: 05/14/2016 Document Reviewed:  05/16/2013 Elsevier Interactive Patient Education  2017 [employer].  9

## 2017-08-11 ENCOUNTER — Other Ambulatory Visit (INDEPENDENT_AMBULATORY_CARE_PROVIDER_SITE_OTHER): Payer: PRIVATE HEALTH INSURANCE

## 2017-08-11 DIAGNOSIS — E782 Mixed hyperlipidemia: Secondary | ICD-10-CM | POA: Diagnosis not present

## 2017-08-11 DIAGNOSIS — E119 Type 2 diabetes mellitus without complications: Secondary | ICD-10-CM | POA: Diagnosis not present

## 2017-08-11 DIAGNOSIS — I10 Essential (primary) hypertension: Secondary | ICD-10-CM | POA: Diagnosis not present

## 2017-08-11 LAB — BASIC METABOLIC PANEL
BUN: 16 mg/dL (ref 6–23)
CHLORIDE: 101 meq/L (ref 96–112)
CO2: 29 mEq/L (ref 19–32)
CREATININE: 0.85 mg/dL (ref 0.40–1.50)
Calcium: 9.7 mg/dL (ref 8.4–10.5)
GFR: 98.23 mL/min (ref >60.00)
Glucose, Bld: 191 mg/dL — ABNORMAL HIGH (ref 70–99)
POTASSIUM: 4 meq/L (ref 3.5–5.1)
Sodium: 139 mEq/L (ref 135–145)

## 2017-08-11 LAB — LIPID PANEL
CHOL/HDL RATIO: 4
Cholesterol: 188 mg/dL (ref 0–200)
HDL: 42.9 mg/dL (ref >39.00)
LDL Cholesterol: 110 mg/dL — ABNORMAL HIGH (ref 0–99)
NonHDL: 144.75
TRIGLYCERIDES: 172 mg/dL — AB (ref 0.0–149.0)
VLDL: 34.4 mg/dL (ref 0.0–40.0)

## 2018-04-05 ENCOUNTER — Ambulatory Visit (INDEPENDENT_AMBULATORY_CARE_PROVIDER_SITE_OTHER): Payer: No Typology Code available for payment source | Admitting: Internal Medicine

## 2018-04-05 ENCOUNTER — Encounter: Payer: Self-pay | Admitting: Internal Medicine

## 2018-04-05 DIAGNOSIS — G44209 Tension-type headache, unspecified, not intractable: Secondary | ICD-10-CM

## 2018-04-05 DIAGNOSIS — F332 Major depressive disorder, recurrent severe without psychotic features: Secondary | ICD-10-CM

## 2018-04-05 DIAGNOSIS — M5430 Sciatica, unspecified side: Secondary | ICD-10-CM | POA: Diagnosis not present

## 2018-04-05 DIAGNOSIS — R41840 Attention and concentration deficit: Secondary | ICD-10-CM | POA: Diagnosis not present

## 2018-04-05 DIAGNOSIS — Z8673 Personal history of transient ischemic attack (TIA), and cerebral infarction without residual deficits: Secondary | ICD-10-CM | POA: Diagnosis not present

## 2018-04-05 DIAGNOSIS — M549 Dorsalgia, unspecified: Secondary | ICD-10-CM

## 2018-04-05 MED ORDER — BUPROPION HCL ER (XL) 150 MG PO TB24: 150.0000 mg | ORAL_TABLET | Freq: Every day | ORAL | 3 refills | Status: DC

## 2018-04-05 NOTE — Patient Instructions (Addendum)
We have sent in the wellbutrin (generic name bupropion) to start taking 1 pill daily for the depression and the concentration. This can take up to 4 weeks to take full effect.   We have sent in flonase to try for the sinuses. Use 2 sprays each nostril once daily and should notice some benefit in 1-2 weeks.   Call us back and let us know how you are doing in 3-4 weeks so we can adjust the dosing if needed.  Call the [facility] to see if they can do ADHD testing before insurance runs out. They are at [address], [place], [place] [address] Phone: [phone]

## 2018-04-05 NOTE — Progress Notes (Signed)
   Subjective:    Patient ID: Curtis Lam, male    DOB: 1961-04-07, 57 y.o.   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 He has not been taking any of his medications in the last 6 months at least and will run out of insurance in about 1 month.   Review of Systems  Constitutional: Positive for activity change, appetite change and fatigue.  HENT: Negative.   Eyes: Negative.   Respiratory: Negative for cough, chest tightness and shortness of breath.   Cardiovascular: Negative for chest pain, palpitations and leg swelling.  Gastrointestinal: Negative for abdominal distention, abdominal pain, constipation, diarrhea, nausea and vomiting.  Musculoskeletal: Positive for arthralgias, gait problem and myalgias.  Skin: Negative.   Neurological: Positive for headaches.  Psychiatric/Behavioral: Positive for behavioral problems, decreased concentration, dysphoric mood and suicidal ideas. Negative for agitation, confusion, hallucinations, self-injury and sleep disturbance. The patient is nervous/anxious. The patient is not hyperactive.       Objective:   Physical Exam  Constitutional: He is oriented to person,  place, and time. He appears well-developed and well-nourished.  HENT:  Head: Normocephalic and atraumatic.  Eyes: EOM are normal.  Neck: Normal range of motion.  Cardiovascular: Normal rate and regular rhythm.  Pulmonary/Chest: Effort normal and breath sounds normal. No respiratory distress. He has no wheezes. He has no rales.  Abdominal: Soft. Bowel sounds are normal. He exhibits no distension. There is no tenderness. There is no rebound.  Musculoskeletal: He exhibits tenderness. He exhibits no edema.  Pain lower back  Neurological: He is alert and oriented to person, place, and time. Coordination normal.  Skin: Skin is warm and dry.  Psychiatric:  Distraught during the visit, logic is clear, appropriate   Vitals:   04/05/18 1353  BP: 138/90  Pulse: 85  Temp: 98.7 F (37.1 C)  TempSrc: Oral  SpO2: 98%  Weight: 195 lb (88.5 kg)  Height: 5\' 7"  (1.702 m)      Assessment & Plan:

## 2018-04-06 DIAGNOSIS — F332 Major depressive disorder, recurrent severe without psychotic features: Secondary | ICD-10-CM | POA: Insufficient documentation

## 2018-04-06 DIAGNOSIS — R41840 Attention and concentration deficit: Secondary | ICD-10-CM | POA: Insufficient documentation

## 2018-04-06 NOTE — Assessment & Plan Note (Signed)
Has stopped taking cholesterol and aspirin. Given his diabetes he is at risk for recurrence although this was associated with artery dissection.

## 2018-04-06 NOTE — Assessment & Plan Note (Signed)
Could be related to sinuses as it is worse lately with pollen. Offered flonase rx to try. Continue tylenol for pain if needed.

## 2018-04-06 NOTE — Assessment & Plan Note (Signed)
It is unclear to me today if this is related to his new severe depression. He does relate that he has had similar problems most of his life. Asked him to go for testing and treatment at [facility]. With his history of stroke he is not a good candidate for stimulants and would need to try strattera or behavioral therapy instead.

## 2018-04-06 NOTE — Assessment & Plan Note (Signed)
Persistent and he has tried tylenol which is not effective. Seen a back specialist and they were not able to help him much either. He does not want to pursue additional treatment at this time due to the acuity of his other conditions today.

## 2018-04-06 NOTE — Assessment & Plan Note (Signed)
Previously was on wellbutrin which worked well. Rx for 150 mg daily wellbutrin and can dose adjust in 3-4 weeks if needed. He is asked to reach out to us sooner. Given lack of insurance hopefully we can get this arranged and 3 month supply prior to loss of insurance although wellbutrin is available at [facility] for reasonable price. Needs close follow up.

## 2018-05-03 ENCOUNTER — Telehealth: Payer: Self-pay | Admitting: Internal Medicine

## 2018-05-03 MED ORDER — BUPROPION HCL ER (XL) 300 MG PO TB24: 300.0000 mg | ORAL_TABLET | Freq: Every day | ORAL | 2 refills | Status: DC

## 2018-05-03 NOTE — Telephone Encounter (Signed)
Will increase wellbutrin to 300 mg daily. Sent in new rx. Can take 2 pills daily of the current medication until gone. I would not recommend something like clonazepam as this is a medicine which has some side effects and tends to make people dependent on it for sleep. He should try melatonin over the counter for sleep.

## 2018-05-03 NOTE — Telephone Encounter (Signed)
Patient informed of MD response and stated understanding. Patient stated that he does use the melatonin as well, but that has not seemed to help, he will get 4 hours and then just toss and turn for the rest of the night. Patient stated he will try the increase in the wellbutrin and see if this helps

## 2018-05-03 NOTE — Telephone Encounter (Signed)
Copied from CRM [id number]. Topic: Quick Communication - See Telephone Encounter >> May 03, 2018  1:40 PM Diana Eves B wrote: CRM for notification. See Telephone encounter for: 05/03/18.  Pt's wife calling in stating she doesn't think her husband is doing as well with the buPROPion (WELLBUTRIN XL) 150 MG 24 hr tablet as he should be. She is wanting to know if maybe a low dose abilify could be called in as well and something to help him sleep at night. She states she is on low dose of klonopin to help her sleep she is wondering if he would benefit from this as well. Please send to [facility] - [place], [place] - [address]

## 2018-10-04 ENCOUNTER — Encounter: Payer: Self-pay | Admitting: Internal Medicine

## 2018-10-04 ENCOUNTER — Ambulatory Visit (INDEPENDENT_AMBULATORY_CARE_PROVIDER_SITE_OTHER): Payer: 59 | Admitting: Internal Medicine

## 2018-10-04 ENCOUNTER — Other Ambulatory Visit (INDEPENDENT_AMBULATORY_CARE_PROVIDER_SITE_OTHER): Payer: 59

## 2018-10-04 VITALS — BP 140/90 | HR 75 | Temp 98.2°F | Ht 67.0 in | Wt 189.0 lb

## 2018-10-04 DIAGNOSIS — E119 Type 2 diabetes mellitus without complications: Secondary | ICD-10-CM | POA: Diagnosis not present

## 2018-10-04 DIAGNOSIS — F332 Major depressive disorder, recurrent severe without psychotic features: Secondary | ICD-10-CM

## 2018-10-04 DIAGNOSIS — Z23 Encounter for immunization: Secondary | ICD-10-CM

## 2018-10-04 DIAGNOSIS — E1169 Type 2 diabetes mellitus with other specified complication: Secondary | ICD-10-CM

## 2018-10-04 DIAGNOSIS — E1140 Type 2 diabetes mellitus with diabetic neuropathy, unspecified: Secondary | ICD-10-CM | POA: Diagnosis not present

## 2018-10-04 DIAGNOSIS — Z8673 Personal history of transient ischemic attack (TIA), and cerebral infarction without residual deficits: Secondary | ICD-10-CM

## 2018-10-04 DIAGNOSIS — E785 Hyperlipidemia, unspecified: Secondary | ICD-10-CM

## 2018-10-04 DIAGNOSIS — I10 Essential (primary) hypertension: Secondary | ICD-10-CM

## 2018-10-04 LAB — CBC
HEMATOCRIT: 49.4 % (ref 39.0–52.0)
Hemoglobin: 16.9 g/dL (ref 13.0–17.0)
MCHC: 34.2 g/dL (ref 30.0–36.0)
MCV: 91.7 fl (ref 78.0–100.0)
Platelets: 187 10*3/uL (ref 150.0–400.0)
RBC: 5.39 Mil/uL (ref 4.22–5.81)
RDW: 12.7 % (ref 11.5–15.5)
WBC: 7.4 10*3/uL (ref 4.0–10.5)

## 2018-10-04 LAB — COMPREHENSIVE METABOLIC PANEL
ALBUMIN: 4.7 g/dL (ref 3.5–5.2)
ALT: 41 U/L (ref 0–53)
AST: 24 U/L (ref 0–37)
Alkaline Phosphatase: 62 U/L (ref 39–117)
BILIRUBIN TOTAL: 0.6 mg/dL (ref 0.2–1.2)
BUN: 18 mg/dL (ref 6–23)
CO2: 30 mEq/L (ref 19–32)
CREATININE: 0.89 mg/dL (ref 0.40–1.50)
Calcium: 9.7 mg/dL (ref 8.4–10.5)
Chloride: 101 mEq/L (ref 96–112)
GFR: 92.78 mL/min (ref >60.00)
Glucose, Bld: 135 mg/dL — ABNORMAL HIGH (ref 70–99)
POTASSIUM: 4.2 meq/L (ref 3.5–5.1)
SODIUM: 139 meq/L (ref 135–145)
Total Protein: 7.6 g/dL (ref 6.0–8.3)

## 2018-10-04 LAB — LIPID PANEL
CHOLESTEROL: 193 mg/dL (ref 0–200)
HDL: 43 mg/dL (ref >39.00)
NONHDL: 150.3
Total CHOL/HDL Ratio: 4
Triglycerides: 211 mg/dL — ABNORMAL HIGH (ref 0.0–149.0)
VLDL: 42.2 mg/dL — ABNORMAL HIGH (ref 0.0–40.0)

## 2018-10-04 LAB — MICROALBUMIN / CREATININE URINE RATIO
Creatinine,U: 210.4 mg/dL
MICROALB UR: 5.5 mg/dL — AB (ref 0.0–1.9)
Microalb Creat Ratio: 2.6 mg/g (ref 0.0–30.0)

## 2018-10-04 LAB — LDL CHOLESTEROL, DIRECT: LDL DIRECT: 123 mg/dL

## 2018-10-04 LAB — HEMOGLOBIN A1C: HEMOGLOBIN A1C: 7.6 % — AB (ref 4.6–6.5)

## 2018-10-04 NOTE — Progress Notes (Signed)
   Subjective:    Patient ID: Curtis Lam, male    DOB: 03-12-1961, 57 y.o.   MRN: [id number]  HPI The patient is a 57 YO man coming in for depression (taking wellbutrin which we started at 150 mg and increased to 300 for better efficacy, this is working well for him, denies SI/HI, has also gotten a job which is helping his mental state a lot) and diabetes (not taking any medications for diabetes currently, was on metformin but ran out of refills, does have some intermittent tingling and sensation of cotton in his feet, denies chest pains or sob, not taking statin or ACE-I or ARB currently) and cholesterol (not taking crestor currently, just stopped taking it, felt he was doing well on his own, has made some changes with diet, denies chest pains or stroke symptoms).   Review of Systems  Constitutional: Negative.   HENT: Negative.   Eyes: Negative.   Respiratory: Negative for cough, chest tightness and shortness of breath.   Cardiovascular: Negative for chest pain, palpitations and leg swelling.  Gastrointestinal: Negative for abdominal distention, abdominal pain, constipation, diarrhea, nausea and vomiting.  Musculoskeletal: Negative.   Skin: Negative.   Neurological: Negative.   Psychiatric/Behavioral: Negative.       Objective:   Physical Exam  Constitutional: He is oriented to person, place, and time. He appears well-developed and well-nourished.  HENT:  Head: Normocephalic and atraumatic.  Eyes: EOM are normal.  Neck: Normal range of motion.  Cardiovascular: Normal rate and regular rhythm.  Pulmonary/Chest: Effort normal and breath sounds normal. No respiratory distress. He has no wheezes. He has no rales.  Abdominal: Soft. Bowel sounds are normal. He exhibits no distension. There is no tenderness. There is no rebound.  Musculoskeletal: He exhibits no edema.  Neurological: He is alert and oriented to person, place, and time. Coordination normal.  Skin: Skin is warm and dry.    Foot exam done  Psychiatric: He has a normal mood and affect.   Vitals:   10/04/18 1528  BP: 140/90  Pulse: 75  Temp: 98.2 F (36.8 C)  TempSrc: Oral  SpO2: 96%  Weight: 189 lb (85.7 kg)  Height: 5\' 7"  (1.702 m)      Assessment & Plan:  Flu shot given at visit

## 2018-10-04 NOTE — Patient Instructions (Signed)
We will check the labs and call you back about the results.    

## 2018-10-06 ENCOUNTER — Telehealth: Payer: Self-pay | Admitting: Internal Medicine

## 2018-10-06 NOTE — Assessment & Plan Note (Signed)
Needs lipid panel and HgA1c, BP at goal but borderline.

## 2018-10-06 NOTE — Assessment & Plan Note (Signed)
Some new neuropathy on the feet, checking HgA1c and lipid panel. Adjust as needed. Currently off statin and metformin. May need adjustment.

## 2018-10-06 NOTE — Assessment & Plan Note (Signed)
Stopped taking amlodipine 10 mg daily. BP borderline today and needs to be monitored again.

## 2018-10-06 NOTE — Assessment & Plan Note (Signed)
Not taking crestor currently. Checking lipid panel but LDL goal needs to be <70 given diabetes and past stroke.

## 2018-10-06 NOTE — Telephone Encounter (Signed)
Already sent for patient this morning

## 2018-10-06 NOTE — Assessment & Plan Note (Signed)
Wellbutrin 300 mg daily is working well, continue.

## 2018-10-06 NOTE — Telephone Encounter (Signed)
Patients wife called in and stated that patient forgot to ask for refill on 10/04/2018 when he was in for his office visit and he currently has 1 tab left of welbutrin.

## 2018-10-07 ENCOUNTER — Other Ambulatory Visit: Payer: Self-pay | Admitting: Internal Medicine

## 2018-10-07 MED ORDER — METFORMIN HCL 500 MG PO TABS: 500.0000 mg | ORAL_TABLET | Freq: Every day | ORAL | 3 refills | Status: DC

## 2018-10-07 MED ORDER — ROSUVASTATIN CALCIUM 20 MG PO TABS: 20.0000 mg | ORAL_TABLET | Freq: Every day | ORAL | 3 refills | Status: DC

## 2018-11-22 DIAGNOSIS — I63311 Cerebral infarction due to thrombosis of right middle cerebral artery: Secondary | ICD-10-CM | POA: Diagnosis not present

## 2018-11-22 DIAGNOSIS — Z9114 Patient's other noncompliance with medication regimen: Secondary | ICD-10-CM | POA: Diagnosis not present

## 2018-11-22 DIAGNOSIS — I6502 Occlusion and stenosis of left vertebral artery: Secondary | ICD-10-CM | POA: Diagnosis not present

## 2018-11-22 DIAGNOSIS — Z7984 Long term (current) use of oral hypoglycemic drugs: Secondary | ICD-10-CM | POA: Diagnosis not present

## 2018-11-22 DIAGNOSIS — G459 Transient cerebral ischemic attack, unspecified: Secondary | ICD-10-CM | POA: Diagnosis not present

## 2018-11-22 DIAGNOSIS — R531 Weakness: Secondary | ICD-10-CM | POA: Diagnosis not present

## 2018-11-22 DIAGNOSIS — I639 Cerebral infarction, unspecified: Secondary | ICD-10-CM | POA: Diagnosis not present

## 2018-11-22 DIAGNOSIS — R42 Dizziness and giddiness: Secondary | ICD-10-CM | POA: Diagnosis not present

## 2018-11-22 DIAGNOSIS — R2681 Unsteadiness on feet: Secondary | ICD-10-CM | POA: Diagnosis not present

## 2018-11-22 DIAGNOSIS — I672 Cerebral atherosclerosis: Secondary | ICD-10-CM | POA: Diagnosis not present

## 2018-11-22 DIAGNOSIS — R9082 White matter disease, unspecified: Secondary | ICD-10-CM | POA: Diagnosis not present

## 2018-11-22 DIAGNOSIS — Z79899 Other long term (current) drug therapy: Secondary | ICD-10-CM | POA: Diagnosis not present

## 2018-11-22 DIAGNOSIS — I7774 Dissection of vertebral artery: Secondary | ICD-10-CM | POA: Diagnosis not present

## 2018-11-22 DIAGNOSIS — I10 Essential (primary) hypertension: Secondary | ICD-10-CM | POA: Diagnosis not present

## 2018-11-22 DIAGNOSIS — E119 Type 2 diabetes mellitus without complications: Secondary | ICD-10-CM | POA: Diagnosis not present

## 2018-11-22 DIAGNOSIS — R29818 Other symptoms and signs involving the nervous system: Secondary | ICD-10-CM | POA: Diagnosis not present

## 2018-11-22 DIAGNOSIS — R269 Unspecified abnormalities of gait and mobility: Secondary | ICD-10-CM | POA: Diagnosis not present

## 2018-11-22 DIAGNOSIS — E782 Mixed hyperlipidemia: Secondary | ICD-10-CM | POA: Diagnosis not present

## 2018-11-22 DIAGNOSIS — I6521 Occlusion and stenosis of right carotid artery: Secondary | ICD-10-CM | POA: Diagnosis not present

## 2018-11-22 DIAGNOSIS — R4781 Slurred speech: Secondary | ICD-10-CM | POA: Diagnosis not present

## 2018-11-23 DIAGNOSIS — I7774 Dissection of vertebral artery: Secondary | ICD-10-CM | POA: Diagnosis not present

## 2018-11-23 DIAGNOSIS — R269 Unspecified abnormalities of gait and mobility: Secondary | ICD-10-CM | POA: Diagnosis not present

## 2018-11-23 DIAGNOSIS — Z7984 Long term (current) use of oral hypoglycemic drugs: Secondary | ICD-10-CM | POA: Diagnosis not present

## 2018-11-23 DIAGNOSIS — Z79899 Other long term (current) drug therapy: Secondary | ICD-10-CM | POA: Diagnosis not present

## 2018-11-23 DIAGNOSIS — R531 Weakness: Secondary | ICD-10-CM | POA: Diagnosis not present

## 2018-11-23 DIAGNOSIS — I6521 Occlusion and stenosis of right carotid artery: Secondary | ICD-10-CM | POA: Diagnosis not present

## 2018-11-23 DIAGNOSIS — I63311 Cerebral infarction due to thrombosis of right middle cerebral artery: Secondary | ICD-10-CM | POA: Insufficient documentation

## 2018-11-23 DIAGNOSIS — E119 Type 2 diabetes mellitus without complications: Secondary | ICD-10-CM | POA: Diagnosis not present

## 2018-11-23 DIAGNOSIS — R4781 Slurred speech: Secondary | ICD-10-CM | POA: Diagnosis not present

## 2018-11-23 DIAGNOSIS — R42 Dizziness and giddiness: Secondary | ICD-10-CM | POA: Diagnosis not present

## 2018-11-23 DIAGNOSIS — I639 Cerebral infarction, unspecified: Secondary | ICD-10-CM | POA: Diagnosis not present

## 2018-11-23 DIAGNOSIS — H5500 Unspecified nystagmus: Secondary | ICD-10-CM | POA: Diagnosis not present

## 2018-11-23 DIAGNOSIS — E782 Mixed hyperlipidemia: Secondary | ICD-10-CM | POA: Diagnosis not present

## 2018-11-23 DIAGNOSIS — E1165 Type 2 diabetes mellitus with hyperglycemia: Secondary | ICD-10-CM | POA: Diagnosis not present

## 2018-11-24 DIAGNOSIS — E1165 Type 2 diabetes mellitus with hyperglycemia: Secondary | ICD-10-CM | POA: Diagnosis not present

## 2018-11-24 DIAGNOSIS — E119 Type 2 diabetes mellitus without complications: Secondary | ICD-10-CM | POA: Diagnosis not present

## 2018-11-24 DIAGNOSIS — Z7984 Long term (current) use of oral hypoglycemic drugs: Secondary | ICD-10-CM | POA: Diagnosis not present

## 2018-11-24 DIAGNOSIS — I63311 Cerebral infarction due to thrombosis of right middle cerebral artery: Secondary | ICD-10-CM | POA: Diagnosis not present

## 2018-11-24 DIAGNOSIS — H5500 Unspecified nystagmus: Secondary | ICD-10-CM | POA: Diagnosis not present

## 2018-11-24 DIAGNOSIS — E782 Mixed hyperlipidemia: Secondary | ICD-10-CM | POA: Diagnosis not present

## 2018-11-24 DIAGNOSIS — R4781 Slurred speech: Secondary | ICD-10-CM | POA: Diagnosis not present

## 2018-11-24 DIAGNOSIS — R531 Weakness: Secondary | ICD-10-CM | POA: Diagnosis not present

## 2018-11-24 DIAGNOSIS — R269 Unspecified abnormalities of gait and mobility: Secondary | ICD-10-CM | POA: Diagnosis not present

## 2018-11-24 DIAGNOSIS — Z79899 Other long term (current) drug therapy: Secondary | ICD-10-CM | POA: Diagnosis not present

## 2018-11-25 ENCOUNTER — Other Ambulatory Visit: Payer: Self-pay | Admitting: Internal Medicine

## 2018-11-25 DIAGNOSIS — I63311 Cerebral infarction due to thrombosis of right middle cerebral artery: Secondary | ICD-10-CM | POA: Diagnosis not present

## 2018-11-25 DIAGNOSIS — Z8673 Personal history of transient ischemic attack (TIA), and cerebral infarction without residual deficits: Secondary | ICD-10-CM

## 2018-11-28 ENCOUNTER — Ambulatory Visit (INDEPENDENT_AMBULATORY_CARE_PROVIDER_SITE_OTHER): Payer: BLUE CROSS/BLUE SHIELD | Admitting: Internal Medicine

## 2018-11-28 ENCOUNTER — Encounter: Payer: Self-pay | Admitting: Internal Medicine

## 2018-11-28 VITALS — BP 144/100 | HR 92 | Temp 97.8°F | Ht 67.0 in | Wt 190.0 lb

## 2018-11-28 DIAGNOSIS — Z8673 Personal history of transient ischemic attack (TIA), and cerebral infarction without residual deficits: Secondary | ICD-10-CM

## 2018-11-28 DIAGNOSIS — I10 Essential (primary) hypertension: Secondary | ICD-10-CM

## 2018-11-28 DIAGNOSIS — E1140 Type 2 diabetes mellitus with diabetic neuropathy, unspecified: Secondary | ICD-10-CM

## 2018-11-28 DIAGNOSIS — R42 Dizziness and giddiness: Secondary | ICD-10-CM | POA: Diagnosis not present

## 2018-11-28 DIAGNOSIS — I7774 Dissection of vertebral artery: Secondary | ICD-10-CM | POA: Diagnosis not present

## 2018-11-28 MED ORDER — METFORMIN HCL 500 MG PO TABS: 500.0000 mg | ORAL_TABLET | Freq: Two times a day (BID) | ORAL | 3 refills | Status: DC

## 2018-11-28 MED ORDER — AMLODIPINE BESYLATE 10 MG PO TABS: 10.0000 mg | ORAL_TABLET | Freq: Every day | ORAL | 3 refills | Status: DC

## 2018-11-28 NOTE — Patient Instructions (Signed)
We have sent in the refill of amlodipine.  Try the epley maneuver to do 2-3 times a day for 1-2 weeks. Then try to do daily for at least a couple of months.  Try to decrease meclizine to once a day starting Wednesday and then stop starting Sunday. You can use this only as needed for dizziness.

## 2018-11-28 NOTE — Assessment & Plan Note (Signed)
Stable on recent imaging at outside facility and reports reviewed of CTA neck as well as MRI neck with contrast.

## 2018-11-28 NOTE — Assessment & Plan Note (Signed)
HgA1c above goal recently with 7.7 so will increase metformin to BID again. Taking statin.

## 2018-11-28 NOTE — Progress Notes (Signed)
   Subjective:    Patient ID: Curtis BubaMichael A Swett, male    DOB: 1961-03-18, 57 y.o.   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 He was asked to switch statins to stronger lipitor from crestor. Has previously had problems with muscle pain from statins. Had been doing well on crestor. He is taking aspirin. Is not having as bad symptoms but is taking meclizine BID currently. Still with some vertigo symptoms with flare with moving head or turning over suddenly. He is not working still and is following up with stroke team from [facility] this Wednesday. Wants to see ENT and they are working on it. He is getting PT for the dizziness and he does not think this is helping. Overall does not feel ready to return to work at this time. Denies chest pains or SOB. Denies abdominal pain or nausea or vomiting or diarrhea or constipation. HgA1c was 7.7.   [facility], [facility] reviewed and updated.   Review of Systems  Constitutional: Positive for activity change, appetite change and fatigue. Negative for diaphoresis, fever and unexpected weight change.  HENT: Negative.   Eyes: Negative.   Respiratory: Negative for cough, chest tightness and shortness of breath.   Cardiovascular: Negative for chest pain, palpitations and leg swelling.  Gastrointestinal: Negative for abdominal distention, abdominal pain, constipation, diarrhea, nausea and vomiting.  Musculoskeletal: Negative.   Skin: Negative.   Neurological: Positive for dizziness. Negative for tremors, syncope, speech difficulty, weakness, light-headedness, numbness and headaches.  Psychiatric/Behavioral: Negative.       Objective:   Physical Exam  Constitutional: He is oriented to person, place, and time. He  appears well-developed and well-nourished.  HENT:  Head: Normocephalic and atraumatic.  Eyes: EOM are normal.  Neck: Normal range of motion.  Cardiovascular: Normal rate and regular rhythm.  Pulmonary/Chest: Effort normal and breath sounds normal. No respiratory distress. He has no wheezes. He has no rales.  Abdominal: Soft. Bowel sounds are normal. He exhibits no distension. There is no tenderness. There is no rebound.  Musculoskeletal: He exhibits no edema.  Neurological: He is alert and oriented to person, place, and time. No cranial nerve deficit. Coordination normal.  Skin: Skin is warm and dry.  Psychiatric: He has a normal mood and affect.   Vitals:   11/28/18 0901  BP: (!) 144/100  Pulse: 92  Temp: 97.8 F (36.6 C)  TempSrc: Oral  SpO2: 96%  Weight: 190 lb (86.2 kg)  Height: 5\' 7"  (1.702 m)      Assessment & Plan:

## 2018-11-28 NOTE — Assessment & Plan Note (Signed)
BP above goal today and will refill amlodipine 10 mg daily. Can increase at follow up if needed. Has been at goal previously on same regimen so possibly vertigo is affecting today.

## 2018-11-28 NOTE — Assessment & Plan Note (Signed)
New recent subacute stroke. He had stopped aspirin and is back on ASA 325 mg daily. He has been switched to lipitor from crestor. BP is elevated today and will monitor. Restart amlodipine 10 mg daily which was removed from list by outside facility. Concerns about vertigo unrelated to stroke and referral to ENT.

## 2018-11-28 NOTE — Assessment & Plan Note (Signed)
Unclear if this is related to stroke or related to recent cold. Given epley maneuver to use to help. Will work to stop meclizine as we talked about long term side effects from long term usage. He will decrease to daily Wednesday and then stop this weekend if controlled. Referral to ENT as well per their request.

## 2018-11-30 DIAGNOSIS — E559 Vitamin D deficiency, unspecified: Secondary | ICD-10-CM | POA: Diagnosis not present

## 2018-11-30 DIAGNOSIS — I63411 Cerebral infarction due to embolism of right middle cerebral artery: Secondary | ICD-10-CM | POA: Diagnosis not present

## 2018-12-07 DIAGNOSIS — I63311 Cerebral infarction due to thrombosis of right middle cerebral artery: Secondary | ICD-10-CM | POA: Diagnosis not present

## 2018-12-07 DIAGNOSIS — Z8673 Personal history of transient ischemic attack (TIA), and cerebral infarction without residual deficits: Secondary | ICD-10-CM | POA: Diagnosis not present

## 2018-12-07 DIAGNOSIS — E1140 Type 2 diabetes mellitus with diabetic neuropathy, unspecified: Secondary | ICD-10-CM | POA: Diagnosis not present

## 2018-12-07 DIAGNOSIS — I10 Essential (primary) hypertension: Secondary | ICD-10-CM | POA: Diagnosis not present

## 2018-12-12 DIAGNOSIS — I63311 Cerebral infarction due to thrombosis of right middle cerebral artery: Secondary | ICD-10-CM | POA: Diagnosis not present

## 2018-12-19 DIAGNOSIS — I63311 Cerebral infarction due to thrombosis of right middle cerebral artery: Secondary | ICD-10-CM | POA: Diagnosis not present

## 2019-01-11 ENCOUNTER — Ambulatory Visit (HOSPITAL_COMMUNITY)
Admission: RE | Admit: 2019-01-11 | Discharge: 2019-01-11 | Disposition: A | Discharge status: Home / Self Care | Payer: BLUE CROSS/BLUE SHIELD | Attending: Psychiatry | Admitting: Psychiatry

## 2019-01-11 ENCOUNTER — Encounter: Payer: Self-pay | Admitting: Family

## 2019-01-11 ENCOUNTER — Ambulatory Visit: Payer: BLUE CROSS/BLUE SHIELD | Admitting: Family

## 2019-01-11 VITALS — BP 140/90 | HR 84 | Temp 98.1°F | Ht 67.0 in | Wt 196.1 lb

## 2019-01-11 DIAGNOSIS — Z833 Family history of diabetes mellitus: Secondary | ICD-10-CM | POA: Insufficient documentation

## 2019-01-11 DIAGNOSIS — Z8673 Personal history of transient ischemic attack (TIA), and cerebral infarction without residual deficits: Secondary | ICD-10-CM | POA: Diagnosis not present

## 2019-01-11 DIAGNOSIS — I10 Essential (primary) hypertension: Secondary | ICD-10-CM

## 2019-01-11 DIAGNOSIS — R413 Other amnesia: Secondary | ICD-10-CM

## 2019-01-11 DIAGNOSIS — F332 Major depressive disorder, recurrent severe without psychotic features: Secondary | ICD-10-CM | POA: Diagnosis not present

## 2019-01-11 DIAGNOSIS — F411 Generalized anxiety disorder: Secondary | ICD-10-CM | POA: Insufficient documentation

## 2019-01-11 DIAGNOSIS — E785 Hyperlipidemia, unspecified: Secondary | ICD-10-CM

## 2019-01-11 DIAGNOSIS — R454 Irritability and anger: Secondary | ICD-10-CM

## 2019-01-11 DIAGNOSIS — Z818 Family history of other mental and behavioral disorders: Secondary | ICD-10-CM | POA: Diagnosis not present

## 2019-01-11 DIAGNOSIS — E1169 Type 2 diabetes mellitus with other specified complication: Secondary | ICD-10-CM | POA: Diagnosis not present

## 2019-01-11 DIAGNOSIS — F329 Major depressive disorder, single episode, unspecified: Secondary | ICD-10-CM | POA: Insufficient documentation

## 2019-01-11 MED ORDER — DIAZEPAM 5 MG PO TABS: ORAL_TABLET | ORAL | 0 refills | Status: DC

## 2019-01-11 MED ORDER — AMLODIPINE BESYLATE 10 MG PO TABS: 10.0000 mg | ORAL_TABLET | Freq: Every day | ORAL | 3 refills | Status: DC

## 2019-01-11 MED ORDER — ATORVASTATIN CALCIUM 40 MG PO TABS: 40.0000 mg | ORAL_TABLET | Freq: Every day | ORAL | 3 refills | Status: DC

## 2019-01-11 MED ORDER — LOSARTAN POTASSIUM 25 MG PO TABS: 25.0000 mg | ORAL_TABLET | Freq: Every day | ORAL | 3 refills | Status: DC

## 2019-01-11 NOTE — BH Assessment (Signed)
Assessment Note  Curtis Lam is an 58 y.o. male. Pt denies SI/HI and AVH. Pt states that he experienced a mini-stroke and Vertigo in December. Per Pt after the mini-stroke and Vertigo his psychiatric symptoms worsened. Pt reported anxiety throughout his life. The Pt also reports mood swings and increased anger. Per Pt his mood swings and increased anger has caused serious problems at work. Per Pt he is seeking outpatient services to assist him with managing his psychiatric symptoms.  Pt denies previous SI attempt. Pt denies previous hospitalization.  Shuvon, NP recommends D/C. A Partial Hospitalization appointment was made for 1/27 at 1330.  Diagnosis: F41.1 GAD  Past Medical History:  Past Medical History:  Diagnosis Date  . Depression   . Elevated cholesterol   . History of migraine headaches    resolved s/p stopping ibuprofen  . Rectal polyp   . 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 COLONOSCOPY  01/02/13  . TONSILLECTOMY    . VASECTOMY      Family History:  Family History  Problem Relation Age of Onset  . Alzheimer's disease Father   . Depression Sister   . Diabetes Son 30  . Cancer Paternal Aunt        uterine, ovarian  . Diabetes Maternal Grandfather   . Heart disease Maternal Grandfather     Social History:  reports that he has never smoked. He has never used smokeless tobacco. He reports current alcohol use of about 1.0 standard drinks of alcohol per week. He reports that he does not use drugs.  Additional Social History:  Alcohol / Drug Use Pain Medications: please see mar Prescriptions: please see mar Over the Counter: please see mar History of alcohol / drug use?: No history of alcohol / drug abuse Longest period of sobriety (when/how long): NA  CIWA: CIWA-Ar BP: (!) 148/83 Pulse Rate: 81 COWS:    Allergies: No Known Allergies  Home Medications: (Not in a hospital  admission)   OB/GYN Status:  No LMP for male patient.  General Assessment Data Location of Assessment: [facility] Assessment Services TTS Assessment: In system Is this a Tele or Face-to-Face Assessment?: Face-to-Face Is this an Initial Assessment or a Re-assessment for this encounter?: Initial Assessment Patient Accompanied by:: Parent Language Other than English: No Living Arrangements: Other (Comment) What gender do you identify as?: Male Marital status: Married [place] name: NA Pregnancy Status: No Living Arrangements: Spouse/significant other Can pt return to current living arrangement?: Yes Admission Status: Voluntary Is patient capable of signing voluntary admission?: Yes Referral Source: Self/Family/Friend Insurance type: Scientist, research (physical sciences) Exam [facility] Walk-in ONLY) Medical Exam completed: Yes  Crisis Care Plan Living Arrangements: Spouse/significant other Legal Guardian: Other:(self) Name of Psychiatrist: NA Name of Therapist: NA  Education Status Is patient currently in school?: No Is the patient employed, unemployed or receiving disability?: Employed  Risk to self with the past 6 months Suicidal Ideation: No Has patient been a risk to self within the past 6 months prior to admission? : No Suicidal Intent: No Has patient had any suicidal intent within the past 6 months prior to admission? : No Is patient at risk for suicide?: No Suicidal Plan?: No Has patient had any suicidal plan within the past 6 months prior to admission? : No Access to Means: No What has been your use of drugs/alcohol within the last 12 months?: NA Previous Attempts/Gestures: No How many times?: 0  Other Self Harm Risks: NA Triggers for Past Attempts: None known Intentional Self Injurious Behavior: None Family Suicide History: No Recent stressful life event(s): Other (Comment)(health issues) Persecutory voices/beliefs?: No Depression: Yes Depression Symptoms: Tearfulness, Isolating, Loss  of interest in usual pleasures, Feeling worthless/self pity, Feeling angry/irritable Substance abuse history and/or treatment for substance abuse?: No Suicide prevention information given to non-admitted patients: Not applicable  Risk to Others within the past 6 months Homicidal Ideation: No Does patient have any lifetime risk of violence toward others beyond the six months prior to admission? : No Thoughts of Harm to Others: No Current Homicidal Intent: No Current Homicidal Plan: No Access to Homicidal Means: No Identified Victim: NA History of harm to others?: No Assessment of Violence: None Noted Violent Behavior Description: NA Does patient have access to weapons?: No Criminal Charges Pending?: No Does patient have a court date: No Is patient on probation?: No  Psychosis Hallucinations: None noted Delusions: None noted  Mental Status Report Appearance/Hygiene: Unremarkable Eye Contact: Fair Motor Activity: Freedom of movement Speech: Logical/coherent Level of Consciousness: Alert Mood: Depressed, Anxious Affect: Anxious, Depressed Anxiety Level: Minimal Thought Processes: Coherent, Relevant Judgement: Unimpaired Orientation: Person, Place, Time, Situation Obsessive Compulsive Thoughts/Behaviors: None  Cognitive Functioning Concentration: Normal Memory: Recent Intact, Remote Intact Is patient IDD: No Insight: Fair Impulse Control: Fair Appetite: Fair Have you had any weight changes? : No Change Sleep: Decreased Total Hours of Sleep: 6 Vegetative Symptoms: None  ADLScreening [facility] Assessment Services) Patient's cognitive ability adequate to safely complete daily activities?: Yes Patient able to express need for assistance with ADLs?: Yes Independently performs ADLs?: Yes (appropriate for developmental age)  Prior Inpatient Therapy Prior Inpatient Therapy: No  Prior Outpatient Therapy Prior Outpatient Therapy: No Does patient have an ACCT team?: No Does  patient have Intensive In-House Services?  : No Does patient have [facility] services? : No Does patient have P4CC services?: No  ADL Screening (condition at time of admission) Patient's cognitive ability adequate to safely complete daily activities?: Yes Is the patient deaf or have difficulty hearing?: No Does the patient have difficulty seeing, even when wearing glasses/contacts?: No Does the patient have difficulty concentrating, remembering, or making decisions?: No Patient able to express need for assistance with ADLs?: Yes Does the patient have difficulty dressing or bathing?: No Independently performs ADLs?: Yes (appropriate for developmental age) Does the patient have difficulty walking or climbing stairs?: No       Abuse/Neglect Assessment (Assessment to be complete while patient is alone) Abuse/Neglect Assessment Can Be Completed: Yes Physical Abuse: Denies Verbal Abuse: Denies Sexual Abuse: Denies Exploitation of patient/patient's resources: Denies     Merchant navy officer (For Healthcare) Does Patient Have a Medical Advance Directive?: No Would patient like information on creating a medical advance directive?: No - Patient declined          Disposition:  Disposition Initial Assessment Completed for this Encounter: Yes Disposition of Patient: Discharge  On Site Evaluation by:   Reviewed with Physician:    Emmit Pomfret 01/11/2019 12:51 PM

## 2019-01-11 NOTE — Progress Notes (Signed)
Curtis Lam is a 58 y.o. male with the following history as recorded in EpicCare:  Patient Active Problem List   Diagnosis Date Noted  . Vertigo 11/28/2018  . MDD (major depressive disorder), recurrent episode, severe (HCC) 04/06/2018  . Concentration deficit 04/06/2018  . Back pain with sciatica 04/05/2017  . Lung nodule 08/14/2016  . 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 Type 2 diabetes mellitus with diabetic neuropathy (HCC) 12/25/2013  . Hx of completed stroke 05/31/2013  . Hyperlipidemia associated with type 2 diabetes mellitus (HCC) 02/16/2012  . Essential hypertension 10/22/2009  . Leg weakness 10/22/2009    Current Outpatient Medications  Medication Sig Dispense Refill  . atorvastatin (LIPITOR) 40 MG tablet Take 1 tablet (40 mg total) by mouth daily at 6 PM. 30 tablet 3  . buPROPion (WELLBUTRIN XL) 300 MG 24 hr tablet TAKE 1 TABLET BY MOUTH DAILY. 30 tablet 2  . cholecalciferol (VITAMIN D3) 25 MCG (1000 UT) tablet Take by mouth.    . diazepam (VALIUM) 5 MG tablet 1/2 tablet at night prn 30 tablet 0  . glucose blood (BAYER CONTOUR TEST) test strip Use as instructed 100 each 12  . losartan (COZAAR) 25 MG tablet Take 1 tablet (25 mg total) by mouth daily. 30 tablet 3  . metFORMIN (GLUCOPHAGE) 500 MG tablet Take 1 tablet (500 mg total) by mouth 2 (two) times daily with a meal. 180 tablet 3  . QC ENTERIC ASPIRIN 325 MG EC tablet   0  . amLODipine (NORVASC) 10 MG tablet Take 1 tablet (10 mg total) by mouth daily. 90 tablet 3  . Multiple Vitamin (MULTIVITAMIN) tablet Take by mouth.    . Omega-3 Fatty Acids (FISH OIL) 1000 MG CAPS Take by mouth.    . Turmeric 450 MG CAPS Take by mouth.    . Vitamin D, Ergocalciferol, (DRISDOL) 1.25 MG (50000 UT) CAPS capsule      No current facility-administered medications for this visit.     Allergies: Patient has no known allergies.  Past Medical History:  Diagnosis Date  . Depression   . Elevated cholesterol   . History  of migraine headaches    resolved s/p stopping ibuprofen  . Rectal polyp   . 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 COLONOSCOPY  01/02/13  . TONSILLECTOMY    . VASECTOMY      Family History  Problem Relation Age of Onset  . Alzheimer's disease Father   . Depression Sister   . Diabetes Son 30  . Cancer Paternal Aunt        uterine, ovarian  . Diabetes Maternal Grandfather   . Heart disease Maternal Grandfather     Social History   Tobacco Use  . Smoking status: Never Smoker  . Smokeless tobacco: Never Used  Substance Use Topics  . Alcohol use: Yes    Alcohol/week: 1.0 standard drinks    Types: 1 Cans of beer per week    Comment: 1 beer or wine 2-3 times per month, more in summer than winter    Subjective:  Patient presents with concerns for declining work performance. Has been told by his employer that he needs to discuss his symptoms with provider in order to have necessary accommodations made; Per patient, he is forgetting things more easily, more frustrated, acting out in anger more easily, having a hard time concentrating; Has known history of HTN, depression; ADD was  questioned at some time int the past year- could not afford to see counselor due to insurance/ cost concerns; in reviewing notes, it looks like he had a suspected stroke in early December/ has [facility]; also has had problems with recurrent vertigo and has recently completed vestibular therapy with ENT.  Patient is accompanied by his wife-helps provide history; she expresses concerns that depression is either the source or aggravated by loss of memory or secondary to the memory changes. Patient does bring blood pressure log today- blood pressure is averaging the 140s-150s/ 90s; has been having some chest pain but not on exertion; just completed cardiac evaluation/ notes he has been wearing a holter  monitor and this was taken off in the past week; admits he is not taking Amlodipine- thought he was told to stop this by his PCP/ OV from December 9 indicates he should be on the Amlodipine.   Objective:  Vitals:   01/11/19 1008  BP: 140/90  Pulse: 84  Temp: 98.1 F (36.7 C)  TempSrc: Oral  SpO2: 96%  Weight: 196 lb 1.9 oz (89 kg)  Height: 5\' 7"  (1.702 m)    General: Well developed, well nourished, in no acute distress  Skin : Warm and dry.  Head: Normocephalic and atraumatic  Lungs: Respirations unlabored; clear to auscultation bilaterally without wheeze, rales, rhonchi  CVS exam: normal rate and regular rhythm.  Neurologic: Alert and oriented; speech intact; face symmetrical; moves all extremities well; CNII-XII intact without focal deficit   Assessment:  1. Essential hypertension   2. Hyperlipidemia associated with type 2 diabetes mellitus (HCC)   3. Hx of completed stroke   4. Severe episode of recurrent major depressive disorder, without psychotic features (HCC)   5. Memory loss   6. Irritable behavior     Plan:  Explained to patient and wife that am concerned for underlying neurologic source of symptoms; there is a history of depression- difficult to determine if it is just not well controlled and causing majority of symptoms or worsening secondary to possible neurologic source; Patient is extremely confused in office about his medications- extensive time spent reviewing medications and making sure medication list is correct; he and wife both express understanding about his medications. Have asked patient to follow-up with his neurologist to discuss the memory changes and they agree- will [facility]; [facility]- they plan to go there after leaving office; keep planned follow-up with PCP for next week; Have also asked patient to take his Amlodipine and Losartan separately; he understands he is to be on  Lipitor 40- not Crestor;   Spent 50 minutes with wife and patient today; greater than 50% spent in counseling;   No follow-ups on file.  No orders of the defined types were placed in this encounter.   Requested Prescriptions   Signed Prescriptions Disp Refills  . atorvastatin (LIPITOR) 40 MG tablet 30 tablet 3    Sig: Take 1 tablet (40 mg total) by mouth daily at 6 PM.  . losartan (COZAAR) 25 MG tablet 30 tablet 3    Sig: Take 1 tablet (25 mg total) by mouth daily.  . diazepam (VALIUM) 5 MG tablet 30 tablet 0    Sig: 1/2 tablet at night prn  . amLODipine (NORVASC) 10 MG tablet 90 tablet 3    Sig: Take 1 tablet (10 mg total) by mouth daily.

## 2019-01-11 NOTE — H&P (Signed)
Behavioral Health Medical Screening Exam  Curtis Lam is an 58 y.o. male patient presents as walk in at [facility] with complains of depression, mood swings and easily agitated.  Reports an incident at work that made him realize he needed to get help.  Patient seeking outpatient services.  Patient denies suicidal/self-harm/homicidal ideation, psychosis, and paranoia.  Wife at side and very supportive  Total Time spent with patient: 30 minutes  Psychiatric Specialty Exam: Physical Exam  Constitutional: He is oriented to person, place, and time. He appears well-developed and well-nourished. No distress.  HENT:  Head: Normocephalic.  Neck: Normal range of motion. Neck supple.  Respiratory: Effort normal.  Musculoskeletal: Normal range of motion.  Neurological: He is alert and oriented to person, place, and time.  Skin: Skin is warm and dry.  Psychiatric: His speech is normal and behavior is normal. Judgment and thought content normal. Cognition and memory are normal. He exhibits a depressed mood (Stable).    Review of Systems  Neurological:       History of "small stroke"  Psychiatric/Behavioral: Negative for hallucinations, memory loss, substance abuse and suicidal ideas. Depression: Reports history. The patient is not nervous/anxious and does not have insomnia.        Patient states that he has long history of depression and being treated by his PCP; has never sought out help.  States he has been having episode of worsening depression, mood swings; and is easily irritated; isolation.  States [place] swings and feels that he needs to get help before loses his current job.   All other systems reviewed and are negative.   Blood pressure (!) 148/83, pulse 81, temperature 98.5 F (36.9 C), resp. rate 16, SpO2 97 %.There is no height or weight on file to calculate BMI.  General Appearance: Casual and Neat  Eye Contact:  Good  Speech:  Clear and  Coherent and Normal Rate  Volume:  Normal  Mood:  Depressed  Affect:  Appropriate and Congruent  Thought Process:  Coherent and Goal Directed  Orientation:  Full (Time, Place, and Person)  Thought Content:  WDL and Logical  Suicidal Thoughts:  No  Homicidal Thoughts:  No  Memory:  Immediate;   Good Recent;   Good Remote;   Good  Judgement:  Intact  Insight:  Good  Psychomotor Activity:  Normal  Concentration: Concentration: Good and Attention Span: Good  Recall:  Good  Fund of Knowledge:Good  Language: Good  Akathisia:  No  Handed:  Right  AIMS (if indicated):     Assets:  Communication Skills Desire for Improvement Financial Resources/Insurance Housing Resilience Social Support Transportation  Sleep:       Musculoskeletal: Strength & Muscle Tone: within normal limits Gait & Station: normal Patient leans: N/A  Blood pressure (!) 148/83, pulse 81, temperature 98.5 F (36.9 C), resp. rate 16, SpO2 97 %.  Recommendations:  Partial Hospitalization.  Appointment with [facility] tomorrow  Based on my evaluation the patient does not appear to have an emergency medical condition.   Disposition: No evidence of imminent risk to self or others at present.   Patient does not meet criteria for psychiatric inpatient admission. Supportive therapy provided about ongoing stressors. Discussed crisis plan, support from social network, calling 911, coming to the Emergency Department, and calling Suicide Hotline. Partial Hospitalization  Tobin Witucki, NP 01/11/2019, 12:34 PM

## 2019-01-11 NOTE — Patient Instructions (Addendum)
Terrilee Croak, MD  [address]  [address]  [place], [place] [address]  Phone: [phone]  Fax: [phone]   [facility] Stroke [facility]  [address] [place] [address] [place] [address]  [phone]  Case, Stormy Card, NP  [address]  [address]  [place], [place] [address]  [phone]  [phone] (Fax)

## 2019-01-16 ENCOUNTER — Other Ambulatory Visit (HOSPITAL_COMMUNITY): Payer: BLUE CROSS/BLUE SHIELD | Attending: Licensed Clinical Social Worker | Admitting: Licensed Clinical Social Worker

## 2019-01-16 DIAGNOSIS — R45851 Suicidal ideations: Secondary | ICD-10-CM | POA: Insufficient documentation

## 2019-01-16 DIAGNOSIS — I63411 Cerebral infarction due to embolism of right middle cerebral artery: Secondary | ICD-10-CM | POA: Diagnosis not present

## 2019-01-16 DIAGNOSIS — F411 Generalized anxiety disorder: Secondary | ICD-10-CM | POA: Diagnosis not present

## 2019-01-18 ENCOUNTER — Encounter (HOSPITAL_COMMUNITY): Payer: Self-pay | Admitting: Psychiatry

## 2019-01-18 ENCOUNTER — Ambulatory Visit (INDEPENDENT_AMBULATORY_CARE_PROVIDER_SITE_OTHER): Payer: BLUE CROSS/BLUE SHIELD | Admitting: Psychiatry

## 2019-01-18 DIAGNOSIS — Z8673 Personal history of transient ischemic attack (TIA), and cerebral infarction without residual deficits: Secondary | ICD-10-CM | POA: Diagnosis not present

## 2019-01-18 DIAGNOSIS — F411 Generalized anxiety disorder: Secondary | ICD-10-CM

## 2019-01-18 DIAGNOSIS — E1140 Type 2 diabetes mellitus with diabetic neuropathy, unspecified: Secondary | ICD-10-CM | POA: Diagnosis not present

## 2019-01-18 DIAGNOSIS — F4323 Adjustment disorder with mixed anxiety and depressed mood: Secondary | ICD-10-CM | POA: Diagnosis not present

## 2019-01-18 DIAGNOSIS — I10 Essential (primary) hypertension: Secondary | ICD-10-CM | POA: Diagnosis not present

## 2019-01-18 DIAGNOSIS — I63311 Cerebral infarction due to thrombosis of right middle cerebral artery: Secondary | ICD-10-CM | POA: Diagnosis not present

## 2019-01-18 DIAGNOSIS — F332 Major depressive disorder, recurrent severe without psychotic features: Secondary | ICD-10-CM | POA: Diagnosis not present

## 2019-01-18 NOTE — Progress Notes (Signed)
Client: [name]  Date: 01/18/19  Time: 10:50-11:46 am  Type of Therapy: Individual Therapy  Diagnosis:?Axis I: Adjustment Disorder with mixed anxiety and depressed mood  Treatment goals addressed: To develop healthy coping skills to use in the workplace to better manage stress and anxiety. Process childhood traumas to alleviate psychiatric symptoms of intermittent anxiety and depression.  Interventions: CBT, Motivational Interviewing, Psychoeducation, Coping Skill Building  Summary: Client Curtis Lam, 57yo male who presents with Adjustment Disorder, due to recent stroke (second one in a year). Counselor using therapeutic interventions to address symptoms of angry outbursts, anxiety, black outs, decrease in work-performance and to prepare for starting medication management and discussing his vocational options with his employer.  Therapist Response: Curtis Lam met with Counselor for individual therapy. Counselor joined with Curtis Lam as he shared about how his work abilities have been negatively impacted by side effects of his recent stroke. Counselor assessed current psychiatric symptoms and life stressors with the Curtis Lam. He stated that his wife has wanted him to engage in therapy services for years and that he finally realized after 2 recent anger outburst at work that he could benefit from doing a mental health check. Curtis Lam shared about his childhood and how it has impacted his mental health over the years. He would like to explore those traumas more to better understand how he has become the man he is today. He would like coping strategies to deal with stressful situations. Counselor taught Curtis Lam the STOP skill to use when he feels like making an emotional response to coworkers. Counselor shared information about Vocational Rehabilitation. Curtis Lam expressed looking forward to participating in therapy and utilizing skills learned in session.  Suicidal/Homicidal: No current safety concerns. No  plan/intent to harm self or others. However, Curtis Lam was triggered by an episode of a investigation show this week that reminded him of a time in his teen years when he contemplated suicide by hanging. This made him teary, but denied current desire to harm self.  Plan: To return in 1 week. Will apply skills learned in session at home, until next session.  ?   , LCSW   

## 2019-01-18 NOTE — Psych (Signed)
Comprehensive Clinical Assessment (CCA) Note  01/18/2019 Curtis Lam [id number]  Visit Diagnosis:      ICD-10-CM   1. Generalized anxiety disorder F41.1       CCA Part One  Part One has been completed on paper by the patient.  (See scanned document in Chart Review)  CCA Part Two A  Intake/Chief Complaint:  CCA Intake With Chief Complaint CCA Part Two Date: 01/16/19 CCA Part Two Time: 1430 Chief Complaint/Presenting Problem: Pt presents for ax for PHP per inpt. Pt walked into inpt last week due to sx of anxiety and depression. Pt originally denied SI and then states "that's not true" and states he has some passive SI, and plan at times to run car into wall, but denies any intent. Pt identifies his wife has his protective factor. Pt shares he had an episode of vertigo at work in 12/19 that sent him to the hospital for days. Pt was told he had a stroke "at some point" prior to hospitalization, which would be pt's 2nd stroke. Pt shares he hasn't felt "right" since this episode, with increased anxiety and depression. Pt reports he has struggled with anxiety and depression since he was young but has never gotten tx for mental health. Pt states he has been struggling at work due to his symptoms and is struggling to complete tasks. Pt was ready to quit when boss encouraged him to seek help before resigning. Pt agreed. Pt reports he is having memory and concentration issues and is unsure if this is due to physical health or mental health. Pt reports he believes depression stems from work related issues and childhood trauma. Pt declined to discuss trauma any further. Pt denies HI/AVH. Pt reports feeling safe going home and returning for tx for individual counseling on Wednesday, 1/29 and psychiatry on 1/31. Patients Currently Reported Symptoms/Problems: increased anxiety and depression; passive SI; hopelessness; mood swings; appetite decreased; decreased/broken sleep; work problems; racing thoughts;  confusion; memory problems; anhedonia; irritability; low energy; poor concentration Individual's Strengths: supportive wife; motivation for treatment; some insight Individual's Preferences: Pt wants to work on trauma and learning to cope with symptoms. Pt does not want to attend a group program. Type of Services Patient Feels Are Needed: Individual Counseling and Psychiatry  Mental Health Symptoms Depression:  Depression: Change in energy/activity, Difficulty Concentrating, Fatigue, Hopelessness, Increase/decrease in appetite, Irritability, Sleep (too much or little), Tearfulness  Mania:     Anxiety:   Anxiety: Difficulty concentrating, Fatigue, Irritability, Restlessness, Sleep, Tension, Worrying  Psychosis:     Trauma:     Obsessions:     Compulsions:     Inattention:     Hyperactivity/Impulsivity:     Oppositional/Defiant Behaviors:     Borderline Personality:     Other Mood/Personality Symptoms:      Mental Status Exam Appearance and self-care  Stature:  Stature: Average  Weight:  Weight: Overweight  Clothing:  Clothing: Casual  Grooming:  Grooming: Normal  Cosmetic use:  Cosmetic Use: None  Posture/gait:  Posture/Gait: Normal  Motor activity:  Motor Activity: Not Remarkable  Sensorium  Attention:  Attention: Distractible  Concentration:  Concentration: Anxiety interferes  Orientation:  Orientation: X5  Recall/memory:  Recall/Memory: Normal  Affect and Mood  Affect:  Affect: Anxious, Tearful  Mood:  Mood: Anxious  Relating  Eye contact:  Eye Contact: Fleeting  Facial expression:  Facial Expression: Anxious  Attitude toward examiner:  Attitude Toward Examiner: Cooperative  Thought and Language  Speech flow: Speech Flow: Normal  Thought content:  Thought Content: Appropriate to mood and circumstances  Preoccupation:     Hallucinations:     Organization:     Company secretaryxecutive Functions  Fund of Knowledge:  Fund of Knowledge: Average  Intelligence:  Intelligence: Average   Abstraction:  Abstraction: Normal  Judgement:  Judgement: Fair  Dance movement psychotherapisteality Testing:  Reality Testing: Adequate  Insight:  Insight: Fair  Decision Making:  Decision Making: Vacilates  Social Functioning  Social Maturity:  Social Maturity: Isolates  Social Judgement:  Social Judgement: Normal  Stress  Stressors:  Stressors: Work, Illness  Coping Ability:  Coping Ability: Deficient supports  Skill Deficits:     Supports:      Family and Psychosocial History: Family history Marital status: Married Number of Years Married: 40 What types of issues is patient dealing with in the relationship?: Pt reports wife is supportive. Does patient have children?: Yes How many children?: 2 How is patient's relationship with their children?: Pt states "I didn't learn to get close" and doesn't have a close relationship with either son.  Childhood History:  Childhood History By whom was/is the patient raised?: Both parents Additional childhood history information: Pt reports father was 2438yrs older than Mom. Mom was 15 when first child born. Pt states parents didn't get along well and Mom took it out on pt when Dad would pay attn to pt. Description of patient's relationship with caregiver when they were a child: Not great with either parents but closer with Dad "but he worked 24/7" Patient's description of current relationship with people who raised him/her: Pt's father is deceased. Pt speaks with mother "about twice a year" Does patient have siblings?: Yes Number of Siblings: 5 Description of patient's current relationship with siblings: Pt reports he does not have a relationship with any sibling; Pt's younger sister killed herself by overdose when she was 218 Did patient suffer any verbal/emotional/physical/sexual abuse as a child?: Yes(Pt declined to discuss further) Has patient ever been sexually abused/assaulted/raped as an adolescent or adult?: No Was the patient ever a victim of a crime or a  disaster?: No Has patient been effected by domestic violence as an adult?: No  CCA Part Two B  Employment/Work Situation: Employment / Work Psychologist, occupationalituation Employment situation: Employed Where is patient currently employed?: [employer] How long has patient been employed?: 76mo Patient's job has been impacted by current illness: Yes Describe how patient's job has been impacted: Pt reports he is unable to complete job tasks, stay focused, or remember what to do What is the longest time patient has a held a job?: 8 years Did You Receive Any Psychiatric Treatment/Services While in [employer]?: No Are There Guns or Other Weapons in Your Home?: No  Education: Education Did Garment/textile technologistYou Graduate From [employer]?: Yes Did You Have An Individualized Education Program (IIEP): No Did You Have Any Difficulty At [employer]?: Yes("reading and comprehension") Were Any Medications Ever Prescribed For These Difficulties?: No  Religion: Religion/Spirituality Are You A Religious Person?: No  Leisure/Recreation: Leisure / Recreation Leisure and Hobbies: gardening  Exercise/Diet: Exercise/Diet Do You Exercise?: No Have You Gained or Lost A Significant Amount of Weight in the Past Six Months?: No Do You Follow a Special Diet?: No Do You Have Any Trouble Sleeping?: Yes Explanation of Sleeping Difficulties: Pt reports he has broken sleep.  CCA Part Two C  Alcohol/Drug Use: Alcohol / Drug Use Pain Medications: please see mar Prescriptions: please see mar Over the Counter: please see mar History of alcohol / drug  use?: No history of alcohol / drug abuse Longest period of sobriety (when/how long): NA                      CCA Part Three  ASAM's:  Six Dimensions of Multidimensional Assessment  Dimension 1:  Acute Intoxication and/or Withdrawal Potential:     Dimension 2:  Biomedical Conditions and Complications:     Dimension 3:  Emotional, Behavioral, or Cognitive Conditions and  Complications:     Dimension 4:  Readiness to Change:     Dimension 5:  Relapse, Continued use, or Continued Problem Potential:     Dimension 6:  Recovery/Living Environment:      Substance use Disorder (SUD)    Social Function:  Social Functioning Social Maturity: Isolates Social Judgement: Normal  Stress:  Stress Stressors: Work, Illness Coping Ability: Deficient supports Patient Takes Medications The Way The Doctor Instructed?: Yes Priority Risk: Moderate Risk  Risk Assessment- Self-Harm Potential: Risk Assessment For Self-Harm Potential Thoughts of Self-Harm: Vague current thoughts Method: Plan without intent(Pt reports plan 2x in past 6 months - driving car into wall) Additional Information for Self-Harm Potential: Family History of Suicide Additional Comments for Self-Harm Potential: Pt reports sister killed herself by overdose when sister was 18yo  Risk Assessment -Dangerous to Others Potential: Risk Assessment For Dangerous to Others Potential Method: No Plan  DSM5 Diagnoses: Patient Active Problem List   Diagnosis Date Noted  . Vertigo 11/28/2018  . MDD (major depressive disorder), recurrent episode, severe (HCC) 04/06/2018  . Concentration deficit 04/06/2018  . Back pain with sciatica 04/05/2017  . Lung nodule 08/14/2016  . 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 Type 2 diabetes mellitus with diabetic neuropathy (HCC) 12/25/2013  . Hx of completed stroke 05/31/2013  . Hyperlipidemia associated with type 2 diabetes mellitus (HCC) 02/16/2012  . Essential hypertension 10/22/2009  . Leg weakness 10/22/2009    Patient Centered Plan: Patient is on the following Treatment Plan(s):  Anxiety  Recommendations for Services/Supports/Treatments: Recommendations for Services/Supports/Treatments Recommendations For Services/Supports/Treatments: Individual Therapy, Medication Management(Pt declines PHP due to not feeling comfortable in a group setting. Pt wants  to try individual counseling and psychiatry at this time. )  Treatment Plan Summary:    Referrals to Alternative Service(s): Referred to Alternative Service(s):   Place:   Date:   Time:    Referred to Alternative Service(s):   Place:   Date:   Time:    Referred to Alternative Service(s):   Place:   Date:   Time:    Referred to Alternative Service(s):   Place:   Date:   Time:     Janee Ureste J Jaccob Czaplicki, LPCA, LCASA

## 2019-01-19 ENCOUNTER — Encounter: Payer: Self-pay | Admitting: Internal Medicine

## 2019-01-19 ENCOUNTER — Ambulatory Visit: Payer: BLUE CROSS/BLUE SHIELD | Admitting: Internal Medicine

## 2019-01-19 DIAGNOSIS — Z8673 Personal history of transient ischemic attack (TIA), and cerebral infarction without residual deficits: Secondary | ICD-10-CM

## 2019-01-19 NOTE — Patient Instructions (Signed)
We will get the papers filled out and see over the next month or two how we can improve things.

## 2019-01-19 NOTE — Progress Notes (Signed)
   Subjective:   Patient ID: Curtis Lam, male    DOB: April 15, 1961, 58 y.o.   MRN: [id number]  HPI The patient is a 58 YO man coming in for some concerns about problems after his stroke. He attempted to go back to work Dec 19th and was going okay. He has been more stressed at work. He did have some emotional lability and has had several episodes where he burst into tears and 1 episode of exploding at his superiors about something. He also has been having significant concentration problems and not attending meeting he is supposed to and not being up to date on work details. This is a problem and he is currently off work for the last week and is seeing therapist and seeing psych on Friday to get help. He has had some concentration difficulties over the years but has not been willing to get help. He has lost many jobs due to this and never felt like taking responsibility for these. He is now willing to get help. His work is working with him to let him get help and try to return if able. Taking wellbutrin and denies side effects with that. Feels like it is not working well enough lately.   Review of Systems  Constitutional: Negative.   HENT: Negative.   Eyes: Negative.   Respiratory: Negative for cough, chest tightness and shortness of breath.   Cardiovascular: Negative for chest pain, palpitations and leg swelling.  Gastrointestinal: Negative for abdominal distention, abdominal pain, constipation, diarrhea, nausea and vomiting.  Musculoskeletal: Negative.   Skin: Negative.   Neurological: Positive for light-headedness.  Psychiatric/Behavioral: Positive for behavioral problems, decreased concentration and dysphoric mood. The patient is nervous/anxious.     Objective:  Physical Exam Constitutional:      Appearance: He is well-developed.  HENT:     Head: Normocephalic and atraumatic.  Neck:     Musculoskeletal: Normal range of motion.  Cardiovascular:     Rate and Rhythm: Normal rate and  regular rhythm.  Pulmonary:     Effort: Pulmonary effort is normal. No respiratory distress.     Breath sounds: Normal breath sounds. No wheezing or rales.  Abdominal:     General: Bowel sounds are normal. There is no distension.     Palpations: Abdomen is soft.     Tenderness: There is no abdominal tenderness. There is no rebound.  Skin:    General: Skin is warm and dry.  Neurological:     Mental Status: He is alert and oriented to person, place, and time.     Coordination: Coordination normal.     Vitals:   01/19/19 0809  BP: 120/80  Pulse: 84  Temp: 97.6 F (36.4 C)  TempSrc: Oral  SpO2: 95%  Weight: 198 lb (89.8 kg)  Height: 5\' 7"  (1.702 m)    Assessment & Plan:  Visit time 25 minutes: greater than 50% of that time was spent in face to face counseling and coordination of care with the patient: counseled about memory changes, stroke changes, emotional lability, counseling versus psych and ways to help with his problems

## 2019-01-20 ENCOUNTER — Encounter: Payer: Self-pay | Admitting: Internal Medicine

## 2019-01-20 ENCOUNTER — Ambulatory Visit (INDEPENDENT_AMBULATORY_CARE_PROVIDER_SITE_OTHER): Payer: BLUE CROSS/BLUE SHIELD | Admitting: Psychiatry

## 2019-01-20 ENCOUNTER — Encounter (HOSPITAL_COMMUNITY): Payer: Self-pay | Admitting: Psychiatry

## 2019-01-20 ENCOUNTER — Telehealth: Payer: Self-pay | Admitting: Emergency Medicine

## 2019-01-20 VITALS — BP 117/72 | HR 88 | Ht 67.0 in | Wt 196.0 lb

## 2019-01-20 DIAGNOSIS — F331 Major depressive disorder, recurrent, moderate: Secondary | ICD-10-CM | POA: Insufficient documentation

## 2019-01-20 DIAGNOSIS — F411 Generalized anxiety disorder: Secondary | ICD-10-CM | POA: Diagnosis not present

## 2019-01-20 DIAGNOSIS — F330 Major depressive disorder, recurrent, mild: Secondary | ICD-10-CM | POA: Insufficient documentation

## 2019-01-20 MED ORDER — BUPROPION HCL ER (XL) 150 MG PO TB24: 150.0000 mg | ORAL_TABLET | Freq: Every day | ORAL | 0 refills | Status: DC

## 2019-01-20 MED ORDER — DULOXETINE HCL 30 MG PO CPEP: ORAL_CAPSULE | ORAL | 0 refills | Status: DC

## 2019-01-20 MED ORDER — TEMAZEPAM 15 MG PO CAPS: 15.0000 mg | ORAL_CAPSULE | Freq: Every evening | ORAL | 0 refills | Status: DC | PRN

## 2019-01-20 NOTE — Telephone Encounter (Signed)
Copied from CRM [id number]. Topic: General - Inquiry >> Jan 20, 2019 12:29 PM Lynne Logan D wrote: Reason for CRM: Pt stated [employer] sent an email regarding STD for pt. The email came from [email or web address] but he does not know where the email was sent. Advised pt to inform [employer] to fax or documentation. Stated he would advise them to do that today.

## 2019-01-20 NOTE — Patient Instructions (Signed)
Plan:  1. Stop diazepam (Valium) at night and start temazepam (restoril) 15 mg for sleep.  2. Decrease dose of Wellbutrin to 150 mg daily. Take it for two weeks then stop.  3. Start duloxetine (Cymbalta) 30 mg daily for a week then start taking two caps i.e. 60 mg daily. If you feel increasingly tired after taking Cymbalta move it to 5-6 PM.

## 2019-01-20 NOTE — Progress Notes (Signed)
Psychiatric Initial Adult Assessment   Patient Identification: Curtis Lam MRN:  [id number] Date of Evaluation:  01/20/2019 Referral Source: wife Chief Complaint:  Anxiety, depression, insomnia Chief Complaint    Establish Care     Visit Diagnosis:    ICD-10-CM   1. Generalized anxiety disorder F41.1   2. Moderate episode of recurrent major depressive disorder (HCC) F33.1     History of Present Illness:  Patient is a 58 yo married male who reports struggleing with anxiety and depression since young age but has never gotten tx for mental health. He has been recently hospitalized because of vertigo and was told that brain imaging indicates that he had a second stroke (undeternmined age). He knew about one previous CVA. He reports that following discharge he has been struggling with focusing, remembering things/forgetfulness, completion of tasks at work and it caused increasing stress, irritability, anxiety and depression. He also had two anger outbursts in response to things that happened at work and following the second such episode he decided that he was ready to quit. His boss encouraged him to seek help before resigning and Curtis Lam agreed. Curtis Lam reports he believes depression stems from work related issues and childhood trauma which he declined to any further. He has been having some passive suicidal thoughts lately but identified his wife has his protective factor. He reports the following problems/symptoms: anxiety and depression; passive SI; hopelessness; mood swings; appetite decreased; decreased/broken sleep; work problems; racing thoughts; confusion; memory problems; anhedonia; irritability; low energy; poor concentration. Of note is that patient had problems at work in the past and left few jobs. He knows he is an introvert, does not particularly enjoy company of others and prefers to stay to to himself. Besides his spouse he does not have many friends but that does not seem to bother  him. In the past patient has tried bupropion and then venlafaxine for depression/anxiety and reports that his wife thinks he did well on either one of them. He was therefore restarted on Wellbutrin but this time does not seem to improve on 300 mg of XL form. [name] takes 2.5 mg of diazepam at bedtime for sleep - sleeps only few hours with it.  He works as a Production designer, theatre/television/film. He served in the [employer] discharged. While in the [employer] he underweent a psychological evaluation which concluded that he has a schizoid personality disorder.  Patient wants to work on trauma and learning to cope with symptoms - started individual counseling and had two sessions so far. He is not interested in attending a group program.  Associated Signs/Symptoms: Depression Symptoms:  depressed mood, anhedonia, insomnia, psychomotor agitation, feelings of worthlessness/guilt, difficulty concentrating, hopelessness, impaired memory, anxiety, loss of energy/fatigue, (Hypo) Manic Symptoms:  Irritable Mood, Anxiety Symptoms:  Excessive Worry, Psychotic Symptoms:  None PTSD Symptoms: NA  Past Psychiatric History: see above  Previous Psychotropic Medications: Yes   Substance Abuse History in the last 12 months:  No.  Consequences of Substance Abuse: Negative  Past Medical History:  Past Medical History:  Diagnosis Date  . Anxiety   . Depression   . Diabetes mellitus, type II (HCC)   . Elevated cholesterol   . History of migraine headaches    resolved s/p stopping ibuprofen  . Rectal polyp   . Schizoaffective disorder (HCC)   . 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 COLONOSCOPY  01/02/13  . TONSILLECTOMY    .  VASECTOMY      Family Psychiatric History: sister committed suicide at age of 55  Family History:  Family History  Problem Relation Age of Onset  . Alzheimer's disease Father   . Depression Sister   . Diabetes Son 30  .  Cancer Paternal Aunt        uterine, ovarian  . Diabetes Maternal Grandfather   . Heart disease Maternal Grandfather     Social History:   Social History   Socioeconomic History  . Marital status: Married    Spouse name: Not on file  . Number of children: 2  . Years of education: Not on file  . Highest education level: High school graduate  Occupational History  . Occupation: Engineer, mining)    Employer: [employer]  Social Needs  . Financial resource strain: Not hard at all  . Food insecurity:    Worry: Never true    Inability: Never true  . Transportation needs:    Medical: No    Non-medical: No  Tobacco Use  . Smoking status: Never Smoker  . Smokeless tobacco: Never Used  Substance and Sexual Activity  . Alcohol use: Yes    Alcohol/week: 1.0 standard drinks    Types: 1 Cans of beer per week    Comment: 1 beer or wine 2-3 times per month, more in summer than winter  . Drug use: No  . Sexual activity: Yes    Partners: Female  Lifestyle  . Physical activity:    Days per week: 0 days    Minutes per session: 0 min  . Stress: Very much  Relationships  . Social connections:    Talks on phone: Not on file    Gets together: Not on file    Attends religious service: Never    Active member of club or organization: Not on file    Attends meetings of clubs or organizations: Not on file    Relationship status: Married  Other Topics Concern  . Not on file  Social History Narrative   Lives with wife and son.  Other son lives in [place].  Wife suffers from depression, recent flare and hospitalization (12/2013); He lost his job 09/2014 for 4-5 weeks, and got new job    Additional Social History: His sister suffered from depression and completed suicide when she was 26. His brother suffered a serious motorcycle accident soon afterwards that left him with severe cognitive limitations due to TBI.   Allergies:  No Known Allergies  Metabolic Disorder Labs: Lab Results   Component Value Date   HGBA1C 7.6 (H) 10/04/2018   MPG 126 (H) 01/23/2015   MPG 120 (H) 12/25/2013   No results found for: PROLACTIN Lab Results  Component Value Date   CHOL 193 10/04/2018   TRIG 211.0 (H) 10/04/2018   HDL 43.00 10/04/2018   CHOLHDL 4 10/04/2018   VLDL 42.2 (H) 10/04/2018   LDLCALC 110 (H) 08/11/2017   LDLCALC 84 08/13/2016   Lab Results  Component Value Date   TSH 2.589 01/23/2015    Therapeutic Level Labs: No results found for: LITHIUM No results found for: CBMZ No results found for: VALPROATE  Current Medications: Current Outpatient Medications  Medication Sig Dispense Refill  . amLODipine (NORVASC) 10 MG tablet Take 1 tablet (10 mg total) by mouth daily. 90 tablet 3  . atorvastatin (LIPITOR) 40 MG tablet Take 1 tablet (40 mg total) by mouth daily at 6 PM. 30 tablet 3  . buPROPion [facility]  XL) 150 MG 24 hr tablet Take 1 tablet (150 mg total) by mouth daily for 14 days. 14 tablet 0  . cholecalciferol (VITAMIN D3) 25 MCG (1000 UT) tablet Take by mouth.    Marland Kitchen. glucose blood (BAYER CONTOUR TEST) test strip Use as instructed 100 each 12  . losartan (COZAAR) 25 MG tablet Take 1 tablet (25 mg total) by mouth daily. 30 tablet 3  . metFORMIN (GLUCOPHAGE) 500 MG tablet Take 1 tablet (500 mg total) by mouth 2 (two) times daily with a meal. 180 tablet 3  . Multiple Vitamin (MULTIVITAMIN) tablet Take by mouth.    . Omega-3 Fatty Acids (FISH OIL) 1000 MG CAPS Take by mouth.    . QC ENTERIC ASPIRIN 325 MG EC tablet   0  . Turmeric 450 MG CAPS Take by mouth.    . Vitamin D, Ergocalciferol, (DRISDOL) 1.25 MG (50000 UT) CAPS capsule     . DULoxetine (CYMBALTA) 30 MG capsule Take 1 capsule (30 mg total) by mouth daily for 7 days, THEN 2 capsules (60 mg total) daily for 23 days. 53 capsule 0  . temazepam (RESTORIL) 15 MG capsule Take 1 capsule (15 mg total) by mouth at bedtime as needed for sleep. 30 capsule 0   No current facility-administered medications for this  visit.     Musculoskeletal: Strength & Muscle Tone: within normal limits Gait & Station: normal Patient leans: N/A  Psychiatric Specialty Exam: Review of Systems  Constitutional: Negative.   HENT: Negative.   Eyes: Negative.   Respiratory: Negative.   Cardiovascular: Negative.   Gastrointestinal: Negative.   Genitourinary: Negative.   Musculoskeletal: Negative.   Skin: Negative.   Neurological: Negative.   Endo/Heme/Allergies: Negative.   Psychiatric/Behavioral: Positive for depression and memory loss. The patient is nervous/anxious and has insomnia.     Blood pressure 117/72, pulse 88, height 5\' 7"  (1.702 m), weight 196 lb (88.9 kg), SpO2 94 %.Body mass index is 30.7 kg/m.  General Appearance: Casual and Well Groomed  Eye Contact:  Fair  Speech:  Clear and Coherent  Volume:  Normal  Mood:  Anxious and Depressed  Affect:  Initially constricted but brightened as the visit progressed  Thought Process:  Coherent and Goal Directed  Orientation:  Full (Time, Place, and Person)  Thought Content:  Rumination  Suicidal Thoughts:  No  Homicidal Thoughts:  No  Memory:  Immediate;   Good Recent;   Fair Remote;   Fair  Judgement:  Fair  Insight:  Fair  Psychomotor Activity:  Normal  Concentration:  Concentration: Good and Attention Span: Good  Recall:  Good  Fund of Knowledge:Good  Language: Good  Akathisia:  Negative  Handed:  Right  AIMS (if indicated):  not done  Assets:  Desire for Improvement Housing Social Support  ADL's:  Intact  Cognition: WNL  Sleep:  Poor   Screenings: GAD-7     [facility]  Total GAD-7 Score  3    Mini-Mental     [facility]2019 [facility]  Total Score (max 30 points )  29    PHQ2-9     [facility]  Nutrition from 05/31/2017 in [facility]  PHQ-2 Total Score  2  0  0  PHQ-9 Total Score  8  -  -  Assessment and Plan: 58 yo married male who reports struggleing with anxiety and depression since young age but has never gotten tx for mental health. He has been recently hospitalized because of vertigo and was told that brain imaging indicates that he had a second stroke (undeternmined age). He knew about one previous CVA. He reports that following discharge he has been struggling with focusing, remembering things/forgetfulness, completion of tasks at work and it caused increasing stress, irritability, anxiety and depression. His MMSE reveals no problems with orientation, focus or immediate recall. 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 He has started counsling recently. No benefit from Wellbutrin 300 mg daily. Middle insomnia despite taking 2.5 mg of diazepam. He appeared detached and his history suggests no particular engagement or need for social life/intercations. He had been diagnosed as having schizoid personality in the past. His sister completed suicide when she was 16 and it appears that that affected him more than he is willing to admit. His brother suffered a serious motorcycle accident soon afterwards that left him with severe cognitive limitations due to TBI. Stuart wonders it the accident was not a consequence of their sister's death (projectiong?) He admits having passive SI recently but denies feeling this was now and is able to contract for safety.  Plan:  1. Stop diazepam (Valium) at night and start temazepam (restoril) 15 mg for sleep.  2. Decrease dose of Wellbutrin to 150 mg daily. Take it for two weeks then stop.  3. Start duloxetine (Cymbalta) 30 mg daily for a week then start taking two caps i.e. 60 mg daily. If you feel increasingly tired after taking Cymbalta move it to 5-6 PM.  4. Continue counseling as per previous plan.  The plan was  discussed with patient. I spend 60 minutes in direct face to face clinical contact with the patient and devoted approximately 50% of this time to explanation of diagnosis, discussion of treatment options and med education. Patient will return for follow up in 4 weeks or prn.      Magdalene Patricia, MD 1/31/202011:00 AM

## 2019-01-20 NOTE — Assessment & Plan Note (Signed)
He wishes to see psych prior to medication changes. Will allow that. Forms filled out for his work to allow for leave for the next 1-2 months to see what improvement can be made in his current symptoms as he is not able to function at his job currently.

## 2019-01-23 NOTE — Telephone Encounter (Signed)
Pt called back to state that job description forms need to be faxed to [employer] [phone] Attn:  Pamella Pert

## 2019-01-23 NOTE — Telephone Encounter (Signed)
Noted  

## 2019-01-23 NOTE — Telephone Encounter (Signed)
Pt wife called stating that [employer] form was brought in during appt with Dr. Okey Dupre 01/19/2019 and pt was advised it would be sent back to his employer 01/20/2019 (that was the deadline). Pt wife notes that [employer] paperwork for short term disability was faxed over 01/20/2019 that will also need to be completed and returned.   Right now the most important concern is sending document back to [employer] (employer form).   Please advise on status   Lorie ph# [phone] (ok to leave message or call patient) [place] ph# [phone]

## 2019-01-23 NOTE — Telephone Encounter (Signed)
Dr.Crawford is aware of this and has the forms, She is out of the office today!

## 2019-01-24 DIAGNOSIS — H524 Presbyopia: Secondary | ICD-10-CM | POA: Diagnosis not present

## 2019-01-24 LAB — HM DIABETES EYE EXAM

## 2019-01-24 NOTE — Telephone Encounter (Signed)
She called I believe in regard to tell him we did have his forms. They are still in MD folder to sign

## 2019-01-24 NOTE — Telephone Encounter (Signed)
all forms have been faxed LVM informing patient

## 2019-01-24 NOTE — Telephone Encounter (Signed)
Patient calling and states that he is returning a call from [place] from Friday 01/20/2019. Please advise.

## 2019-01-25 ENCOUNTER — Ambulatory Visit (INDEPENDENT_AMBULATORY_CARE_PROVIDER_SITE_OTHER): Payer: BLUE CROSS/BLUE SHIELD | Admitting: Psychiatry

## 2019-01-25 ENCOUNTER — Encounter (HOSPITAL_COMMUNITY): Payer: Self-pay | Admitting: Psychiatry

## 2019-01-25 DIAGNOSIS — F4323 Adjustment disorder with mixed anxiety and depressed mood: Secondary | ICD-10-CM

## 2019-01-25 DIAGNOSIS — F331 Major depressive disorder, recurrent, moderate: Secondary | ICD-10-CM | POA: Diagnosis not present

## 2019-01-25 NOTE — Telephone Encounter (Signed)
Patient came by the office to pick up a copy of this paperwork. I made a copy of what was in the folder for him and gave to the patient.

## 2019-01-25 NOTE — Progress Notes (Signed)
lient: Curtis Lam  Date: 01/25/19  Time: 3:01-3:54pm  Type of Therapy: Individual Therapy  Axis I Diagnosis: Adjustment Disorder with mixed anxiety and depressed mood  Treatment goals addressed: To develop healthy coping skills to use in the workplace to better manage stress and anxiety. Process childhood traumas to alleviate psychiatric symptoms of intermittent anxiety and depression.  Interventions: CBT, Motivational Interviewing, Psychoeducation, Coping Skill Building  Summary: Client Curtis Lam, 58yo male who presents with Adjustment Disorder, due to recent stroke (second one in a year). Counselor using therapeutic interventions to address symptoms of angry outbursts, anxiety, black outs, decrease in work-performance and to prepare for starting medication management and discussing his vocational options with his employer.  Therapist Response: Curtis Lam met with Counselor for individual therapy. Counselor joined with Curtis Lam as he shared 3 positive reports about his health, finances, and job situation. Counselor assessed current psychiatric symptoms and life stressors with the Curtis Lam. He stated that overall his depression has not worsened, but his anxiety has been more amplified, attempting to get paperwork completed and waiting to hear back from entities. He reported using the STOP skill when communicating with others while he was frustrated. He noticed he was less emotional and could communicate more respectfully and effectively. Counselor challenged come of his negative cognitions and we discussed alternative cognitions, opening up the space for more solutions and healthy behaviors. Counselor provided psychoeducation on prioritizing his mental and physical health. Curtis Lam plans on returning and reported that participating in therapy has already been beneficial for him and in his relationship with his wife.  Suicidal/Homicidal: No current safety concerns. No plan/intent to harm self or others. However,  Curtis Lam was triggered by an episode of an investigation show this week that reminded him of a time in his teen years when he contemplated suicide by hanging. This made him teary, but denied current desire to harm self.  Plan: To return in 1 week. Will apply skills learned in session at home, until next session.  ?  [name], LCSW

## 2019-02-01 ENCOUNTER — Ambulatory Visit (INDEPENDENT_AMBULATORY_CARE_PROVIDER_SITE_OTHER): Payer: BLUE CROSS/BLUE SHIELD | Admitting: Psychiatry

## 2019-02-01 DIAGNOSIS — F331 Major depressive disorder, recurrent, moderate: Secondary | ICD-10-CM | POA: Diagnosis not present

## 2019-02-01 DIAGNOSIS — F4323 Adjustment disorder with mixed anxiety and depressed mood: Secondary | ICD-10-CM | POA: Diagnosis not present

## 2019-02-02 ENCOUNTER — Encounter (HOSPITAL_COMMUNITY): Payer: Self-pay | Admitting: Psychiatry

## 2019-02-02 NOTE — Progress Notes (Signed)
Client: Curtis Lam  Date: 02/02/19  Time: 3:33-4:27 pm  Type of Therapy: Individual Therapy  Axis I Diagnosis: Adjustment Disorder with mixed anxiety and depressed mood; Major Depression, moderate recurrent  Treatment goals addressed: To develop healthy coping skills to use in the workplace to better manage stress and anxiety. Process childhood traumas to alleviate psychiatric symptoms of intermittent anxiety and depression.  Interventions: CBT, Motivational Interviewing, Psychoeducation, Coping Skill Building  Summary: Client Curtis Lam, 58yo male who presents with Adjustment Disorder and Major Depressive Disorder, due to recent stroke (second one in a year). Counselor using therapeutic interventions to address symptoms of angry outbursts, anxiety, black outs, decrease in work-performance and to prepare for starting medication management and discussing his vocational options with his employer.  Therapist Response: Curtis Lam met with Counselor for individual therapy. Counselor joined with Curtis Lam as he shared about negative side effects of medicine, continued memory issues, unhealthy daily routine, and anxiety about work situation. Counselor assessed current psychiatric symptoms and life stressors with the Curtis Lam. Curtis Lam is experiencing disrupted/inadequate sleep despite being on medications for sleep. He feels nausea most of the day due to medications. He reports having recurrent, but fleeting thoughts of wanting to drive car into a tree or bridge to end his life, due to anxiety from work situation and frustration with his memory issues. Counselor explored suicidal ideations, discussed a safety plan, and coached around how to share his concerns with his wife. He identified that she is his main motivator to stay alive and that he is hesitant, but willing to try opening up to her more. Counselor shared resources to get memory issues assessed. Counselor provided psychoeducation about benefits in altering his  daily routine to improve mental health. Counselor explored option of inviting wife into sessions to complete the 5 Love Languages to improve connection and intimacy concerns. Curtis Lam took note of things he wants to tweak or look into this week from topics covered in session. Plans to return next week, left more hopeful.  Suicidal/Homicidal: Current safety concerns-in today's session he mentioned thoughts of wanting to drive into a tree or off a bridge, when he is overcome with anxiety. He reports having his wife as a motivator to refrain from acting on thoughts. No clear plan/intent to harm self or others.  Plan: To return in 1 week. Will apply skills learned in session at home, until next session.  ?  Curtis Auer, LCSW

## 2019-02-08 ENCOUNTER — Encounter (HOSPITAL_COMMUNITY): Payer: Self-pay | Admitting: Psychiatry

## 2019-02-08 ENCOUNTER — Ambulatory Visit (INDEPENDENT_AMBULATORY_CARE_PROVIDER_SITE_OTHER): Payer: BLUE CROSS/BLUE SHIELD | Admitting: Psychiatry

## 2019-02-08 DIAGNOSIS — F4323 Adjustment disorder with mixed anxiety and depressed mood: Secondary | ICD-10-CM

## 2019-02-08 DIAGNOSIS — F331 Major depressive disorder, recurrent, moderate: Secondary | ICD-10-CM | POA: Diagnosis not present

## 2019-02-08 NOTE — Progress Notes (Signed)
Client: Hiroshi Krummel  Date: 02/08/19  Time: 3:28-4:29 pm  Type of Therapy: Individual Therapy  Axis I Diagnosis: Adjustment Disorder with mixed anxiety and depressed mood; Major Depression, moderate recurrent  Treatment goals addressed: To develop healthy coping skills to use in the workplace to better manage stress and anxiety. Process childhood traumas to alleviate psychiatric symptoms of intermittent anxiety and depression.  Interventions: CBT, Motivational Interviewing, Psychoeducation, Coping Skill Building  Summary: Client Oluwanifemi, 58yo male who presents with Adjustment Disorder and Major Depressive Disorder, due to recent stroke (second one in a year). Counselor using therapeutic interventions to address symptoms of angry outbursts, anxiety, black outs, decrease in work-performance and to prepare for starting medication management and discussing his vocational options with his employer.  Therapist Response: [name] met with Counselor for individual therapy. Counselor joined with [name] as he shared about positive change in routine, job applications, connecting more with wife, and medication side effects. Counselor assessed current psychiatric symptoms and life stressors with the [name]. Eliyohu denied any new thoughts of self-harm since 2/14. Coden continues to report disrupted and inadequate sleep and nausea from new medications. He plans to see the Dr this month to follow up. However, he reports a decrease in anxiety and increase in memory functioning, though only a slight improvement. Counselor followed up on homework. Urho reported that he has decreased amount of screen time, which was causing his anxiety to increase and productivity at home to decrease. He attempted connecting more with his wife and noted positive outcomes. He was able to apply for several jobs that he believes will be a better fit for his current functioning. Counselor praised him for progress and improved mood.  Counselor provided psychoeducation on 5 Love Languages and administered the assessment. Dandrea enjoyed the survey and found the finding to be insightful. Counselor challenged him to complete assessment with his wife as homework and to discuss how they can better meet each other's needs in this way.  Suicidal/Homicidal: No current safety concerns. Last week (2/13)-he mentioned thoughts of wanting to drive into a tree or off a bridge, when he is overcome with anxiety. He reports having his wife as a motivator to refrain from acting on thoughts. No clear plan/intent to harm self or others.  Plan: To return in 1 week. Will apply skills learned in session at home, until next session.  ?  [name], LCSW

## 2019-02-13 ENCOUNTER — Encounter: Payer: Self-pay | Admitting: Internal Medicine

## 2019-02-13 DIAGNOSIS — Z8673 Personal history of transient ischemic attack (TIA), and cerebral infarction without residual deficits: Secondary | ICD-10-CM

## 2019-02-15 ENCOUNTER — Telehealth (HOSPITAL_COMMUNITY): Payer: Self-pay

## 2019-02-15 ENCOUNTER — Ambulatory Visit (INDEPENDENT_AMBULATORY_CARE_PROVIDER_SITE_OTHER): Payer: BLUE CROSS/BLUE SHIELD | Admitting: Psychiatry

## 2019-02-15 DIAGNOSIS — F331 Major depressive disorder, recurrent, moderate: Secondary | ICD-10-CM

## 2019-02-15 DIAGNOSIS — F4323 Adjustment disorder with mixed anxiety and depressed mood: Secondary | ICD-10-CM | POA: Diagnosis not present

## 2019-02-15 NOTE — Telephone Encounter (Signed)
Patient is coming in to see you Friday. He wants you to know that he is not sleeping and that the Cymbalta is continuing to make him sick. Patient would like to know if he can increase the Remeron and see if that helps with the sleep. Please review and advise, thank you

## 2019-02-16 ENCOUNTER — Encounter (HOSPITAL_COMMUNITY): Payer: Self-pay | Admitting: Psychiatry

## 2019-02-16 NOTE — Telephone Encounter (Signed)
I do not see Remeron among his home medications? He is supposed to be on Restoril 15 mg and he should try taking 30 mg if 15 mg is not helpful. I would also recommend trying to take Cymbalta 30 mg twice daily with food instead of two at one time - may be better tolerated this way.  OP

## 2019-02-16 NOTE — Telephone Encounter (Signed)
Wrong Provider. Seen by Dr Hinton Dyer

## 2019-02-17 ENCOUNTER — Encounter: Payer: Self-pay | Admitting: Internal Medicine

## 2019-02-17 ENCOUNTER — Encounter (HOSPITAL_COMMUNITY): Payer: Self-pay | Admitting: Psychiatry

## 2019-02-17 ENCOUNTER — Ambulatory Visit (INDEPENDENT_AMBULATORY_CARE_PROVIDER_SITE_OTHER): Payer: BLUE CROSS/BLUE SHIELD | Admitting: Psychiatry

## 2019-02-17 ENCOUNTER — Encounter: Payer: Self-pay | Admitting: Family

## 2019-02-17 VITALS — BP 143/86 | HR 86 | Ht 67.0 in | Wt 197.0 lb

## 2019-02-17 DIAGNOSIS — F411 Generalized anxiety disorder: Secondary | ICD-10-CM | POA: Diagnosis not present

## 2019-02-17 DIAGNOSIS — F331 Major depressive disorder, recurrent, moderate: Secondary | ICD-10-CM | POA: Diagnosis not present

## 2019-02-17 MED ORDER — TEMAZEPAM 30 MG PO CAPS: 30.0000 mg | ORAL_CAPSULE | Freq: Every evening | ORAL | 1 refills | Status: DC | PRN

## 2019-02-17 MED ORDER — VORTIOXETINE HBR 5 MG PO TABS: ORAL_TABLET | ORAL | 0 refills | Status: DC

## 2019-02-17 NOTE — Progress Notes (Signed)
BH MD/PA/NP OP Progress Note  02/17/2019 8:44 AM Curtis Lam  MRN:  [id number]  Chief Complaint: Anxiety, insomnia, difficulty focusing HPI: 58 yo married male who comes for a follow up visit still reporting high anxiety and some depression. He has been started on duloxetine but does not tolerate it well because of nausea which has not resolved over past 4 weeks. Sleep has been problematic while taking 15 mg of Restoril but he slept much better last two days when taking 30 mg, as recommended. He has had two CVA and reports problems with focusing, remembering things/forgetfulness, completion of tasks at work and it caused increasing stress, irritability, anxiety and depression. His MMSE did not reveal significant problems with orientation, focus or immediate recall. He reported improved attention and libido (which has been low lately) while he was taking brand name Wellbutrin XL.  He did not have similar response to generic bupropion XL.  He has started counsling recently. Hagen is on an [facility] which will expire in a week. He does not feel being ready emotionally and cognitively to return to work as yet. No current SI (has had passive SI in recent past). Visit Diagnosis:    ICD-10-CM   1. Generalized anxiety disorder F41.1   2. Moderate episode of recurrent major depressive disorder (HCC) F33.1     Past Psychiatric History: please see H&P in initial assessment visit.  Past Medical History:  Past Medical History:  Diagnosis Date  . Anxiety   . Depression   . Diabetes mellitus, type II (HCC)   . Elevated cholesterol   . History of migraine headaches    resolved s/p stopping ibuprofen  . Rectal polyp   . Schizoaffective disorder (HCC)   . 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 COLONOSCOPY  01/02/13  . TONSILLECTOMY    . VASECTOMY      Family Psychiatric History: reviewed  Family History:  Family History  Problem  Relation Age of Onset  . Alzheimer's disease Father   . Depression Sister   . Diabetes Son 30  . Cancer Paternal Aunt        uterine, ovarian  . Diabetes Maternal Grandfather   . Heart disease Maternal Grandfather     Social History:  Social History   Socioeconomic History  . Marital status: Married    Spouse name: Not on file  . Number of children: 2  . Years of education: Not on file  . Highest education level: High school graduate  Occupational History  . Occupation: Engineer, mining)    Employer: [employer]  Social Needs  . Financial resource strain: Not hard at all  . Food insecurity:    Worry: Never true    Inability: Never true  . Transportation needs:    Medical: No    Non-medical: No  Tobacco Use  . Smoking status: Never Smoker  . Smokeless tobacco: Never Used  Substance and Sexual Activity  . Alcohol use: Yes    Alcohol/week: 1.0 standard drinks    Types: 1 Cans of beer per week    Comment: 1 beer or wine 2-3 times per month, more in summer than winter  . Drug use: No  . Sexual activity: Yes    Partners: Female  Lifestyle  . Physical activity:    Days per week: 0 days    Minutes per session: 0 min  . Stress: Very much  Relationships  .  Social connections:    Talks on phone: Not on file    Gets together: Not on file    Attends religious service: Never    Active member of club or organization: Not on file    Attends meetings of clubs or organizations: Not on file    Relationship status: Married  Other Topics Concern  . Not on file  Social History Narrative   Lives with wife and son.  Other son lives in [place].  Wife suffers from depression, recent flare and hospitalization (12/2013); He lost his job 09/2014 for 4-5 weeks, and got new job    Allergies: No Known Allergies  Metabolic Disorder Labs: Lab Results  Component Value Date   HGBA1C 7.6 (H) 10/04/2018   MPG 126 (H) 01/23/2015   MPG 120 (H) 12/25/2013   No results found for:  PROLACTIN Lab Results  Component Value Date   CHOL 193 10/04/2018   TRIG 211.0 (H) 10/04/2018   HDL 43.00 10/04/2018   CHOLHDL 4 10/04/2018   VLDL 42.2 (H) 10/04/2018   LDLCALC 110 (H) 08/11/2017   LDLCALC 84 08/13/2016   Lab Results  Component Value Date   TSH 2.589 01/23/2015   TSH 2.765 12/25/2013    Therapeutic Level Labs: No results found for: LITHIUM No results found for: VALPROATE No components found for:  CBMZ  Current Medications: Current Outpatient Medications  Medication Sig Dispense Refill  . amLODipine (NORVASC) 10 MG tablet Take 1 tablet (10 mg total) by mouth daily. 90 tablet 3  . atorvastatin (LIPITOR) 40 MG tablet Take 1 tablet (40 mg total) by mouth daily at 6 PM. 30 tablet 3  . cholecalciferol (VITAMIN D3) 25 MCG (1000 UT) tablet Take by mouth.    . DULoxetine (CYMBALTA) 30 MG capsule Take 1 capsule (30 mg total) by mouth daily for 7 days, THEN 2 capsules (60 mg total) daily for 23 days. 53 capsule 0  . glucose blood (BAYER CONTOUR TEST) test strip Use as instructed 100 each 12  . losartan (COZAAR) 25 MG tablet Take 1 tablet (25 mg total) by mouth daily. 30 tablet 3  . metFORMIN (GLUCOPHAGE) 500 MG tablet Take 1 tablet (500 mg total) by mouth 2 (two) times daily with a meal. 180 tablet 3  . Multiple Vitamin (MULTIVITAMIN) tablet Take by mouth.    . QC ENTERIC ASPIRIN 325 MG EC tablet   0  . raNITIdine HCl (ZANTAC PO) Take by mouth.    . temazepam (RESTORIL) 15 MG capsule Take 1 capsule (15 mg total) by mouth at bedtime as needed for sleep. 30 capsule 0  . Turmeric 450 MG CAPS Take by mouth.    . Vitamin D, Ergocalciferol, (DRISDOL) 1.25 MG (50000 UT) CAPS capsule     . buPROPion (WELLBUTRIN XL) 150 MG 24 hr tablet Take 1 tablet (150 mg total) by mouth daily for 14 days. 14 tablet 0  . Omega-3 Fatty Acids (FISH OIL) 1000 MG CAPS Take by mouth.     No current facility-administered medications for this visit.      Musculoskeletal: Strength & Muscle Tone:  within normal limits Gait & Station: normal Patient leans: N/A  Psychiatric Specialty Exam: Review of Systems  Constitutional: Negative.   HENT: Negative.   Eyes: Negative.   Respiratory: Negative.   Cardiovascular: Negative.   Gastrointestinal: Negative.   Genitourinary: Negative.   Musculoskeletal: Positive for back pain.  Skin: Negative.   Neurological: Negative.   Endo/Heme/Allergies: Negative.   Psychiatric/Behavioral: Positive for  depression. The patient is nervous/anxious.     Blood pressure (!) 143/86, pulse 86, height  (1.702 m), weight 197 lb (89.4 kg), SpO2 94 %.Body mass index is 30.85 kg/m.  General Appearance: Casual and Fairly Groomed  Eye Contact:  Good  Speech:  Clear and Coherent  Volume:  Normal  Mood:  Anxious and Depressed  Affect:  Congruent and Constricted  Thought Process:  Goal Directed  Orientation:  Full (Time, Place, and Person)  Thought Content: Logical   Suicidal Thoughts:  No  Homicidal Thoughts:  No  Memory:  Immediate;   Good Recent;   Good Remote;   Good  Judgement:  Intact  Insight:  Fair  Psychomotor Activity:  Normal  Concentration:  Concentration: Fair  Recall:  Good  Fund of Knowledge: Good  Language: Good  Akathisia:  Negative  Handed:  Right  AIMS (if indicated): not done  Assets:  Desire for Improvement Housing Social Support  ADL's:  Intact  Cognition: WNL  Sleep:  Fair   Screenings: GAD-7     Office Visit from 10/04/2018 in [facility]  Total GAD-7 Score  3    Mini-Mental     [facility]/2019 [facility]  Total Score (max 30 points )  29    PHQ2-9     Office Visit from 10/04/2018 in [facility] Office Visit from 08/06/2017 in [facility] Nutrition [facility]/31/2017 [facility]  PHQ-2 Total Score  2  0  0  PHQ-9 Total Score  8  -  -        Assessment and Plan: 58 yo married male with GAD and MDD who has not responded well to a new antidepressant duloxetine as he cannot tolerate adverse GI effect. Poor sleep on Restoril 15 mg but much improved on a higher dose of 30 mg. Continues to complain about poor concentration/memory not reflected in MMSE. He reports that his cognition was better when he was on Wellbutrin XL 300 mg (brand name), but did not on generic bupropion XL. He also failed a trial of venlafaxine in the past. He has had two CVAs which could have affected his cognition. He may also has an undiagnosed ADD but no hx of cognitive struggles growing up is known.  Plan: Discontinue duloxetine and instead start vortioxetine 5 mg daily for 7 days then increased dose to 10 mg daily. Continue Restoril 30 mg at HS as needed for sleep. We will likely add bupropion SR 100 mg bid next for focusing/energy. He is continuing in counseling with Hilbert Odor LCSW. He will need extension of his [facility] and will return to clinic in one month.   Magdalene Patricia, MD 02/17/2019, 8:44 AM

## 2019-02-19 NOTE — Progress Notes (Signed)
Client: Curtis Lam  Date: 02/15/19  Time: 3:34-4:28 pm  Type of Therapy: Individual Therapy  Axis I Diagnosis: Adjustment Disorder with mixed anxiety and depressed mood; Major Depression, moderate recurrent  Treatment goals addressed: To develop healthy coping skills to use in the workplace to better manage stress and anxiety. Process childhood traumas to alleviate psychiatric symptoms of intermittent anxiety and depression.  Interventions: CBT, Motivational Interviewing, Psychoeducation, Coping Skill Building  Summary: Client Curtis Lam, 58yo male who presents with Adjustment Disorder and Major Depressive Disorder, due to recent stroke (second one in a year). Counselor using therapeutic interventions to address symptoms of angry outbursts, anxiety, black outs, decrease in work-performance and to prepare for starting medication management and discussing his vocational options with his employer.  Therapist Response: Curtis Lam met with Counselor for individual therapy. Counselor joined with Curtis Lam as he shared about medication issues, starting back work next week, driving problems and setting up more structure for himself. Counselor assessed current psychiatric symptoms and life stressors with the Curtis Lam. Curtis Lam reported being very irritable and on edge this week, "not pleasant to be around". He remains very concerned about his memory problems, in the context of starting back to work. Counselor explored some of his fears and discussed strategies to put in place to make his work environment more manageable. Counselor prompted Curtis Lam to share about his experience with his homework for him and his wife. Curtis Lam reported that it was interesting and helpful, but that he waited until this morning to complete it, so there will be more processing with his wife when he get home. Due to his medication concerns, Counselor ensured to connect him with the Psychiatric Nurse at the end of session to share about his current  side effects.  Suicidal/Homicidal: No current safety concerns. Denies any current suicidal ideation. On 2/13, he mentioned thoughts of wanting to drive into a tree or off a bridge, when he is overcome with anxiety. He reports having his wife as a motivator to refrain from acting on thoughts. No clear plan/intent to harm self or others.  Plan: To return in 1 week. Will apply skills learned in session at home, until next session.  [name], LCSW

## 2019-02-20 ENCOUNTER — Other Ambulatory Visit: Payer: Self-pay | Admitting: Podiatry

## 2019-02-20 ENCOUNTER — Ambulatory Visit (INDEPENDENT_AMBULATORY_CARE_PROVIDER_SITE_OTHER): Payer: BLUE CROSS/BLUE SHIELD

## 2019-02-20 ENCOUNTER — Ambulatory Visit: Payer: BLUE CROSS/BLUE SHIELD | Admitting: Podiatry

## 2019-02-20 VITALS — BP 159/97 | HR 89

## 2019-02-20 DIAGNOSIS — M7732 Calcaneal spur, left foot: Secondary | ICD-10-CM

## 2019-02-20 DIAGNOSIS — M79672 Pain in left foot: Secondary | ICD-10-CM | POA: Diagnosis not present

## 2019-02-20 DIAGNOSIS — M7731 Calcaneal spur, right foot: Secondary | ICD-10-CM | POA: Diagnosis not present

## 2019-02-20 DIAGNOSIS — E1149 Type 2 diabetes mellitus with other diabetic neurological complication: Secondary | ICD-10-CM

## 2019-02-20 DIAGNOSIS — M79671 Pain in right foot: Secondary | ICD-10-CM | POA: Diagnosis not present

## 2019-02-20 MED ORDER — GABAPENTIN 100 MG PO CAPS: 100.0000 mg | ORAL_CAPSULE | Freq: Every day | ORAL | 0 refills | Status: DC

## 2019-02-20 NOTE — Progress Notes (Signed)
Subjective:   Patient ID: Curtis Lam, male   DOB: 58 y.o.   MRN: [id number]   HPI 58 year old male presents the office today for concerns of bilateral foot pain with the right side worse than left.  This been ongoing about 6 months in the left foot over the last couple weeks.  He states that he feels almost a "craking" or "cramping" sensations into his toes mostly at nighttime that wakes him up.  At first he thought it was more numbness but he does get burning to the toes as well.  He denies any recent injury or trauma.  He also states he feels a " puffiness" to his feet.  He has had no recent treatment for this.  He previously was having right hip pain and wants his leg on the right side presents getting better.  His last A1c was 7.6.  He states that he has been drinking diet teas and his blood sugars been between 2 reading 300.    Review of Systems  All other systems reviewed and are negative.  Past Medical History:  Diagnosis Date  . Anxiety   . Depression   . Diabetes mellitus, type II (HCC)   . Elevated cholesterol   . History of migraine headaches    resolved s/p stopping ibuprofen  . Rectal polyp   . Schizoaffective disorder (HCC)   . 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 COLONOSCOPY  01/02/13  . TONSILLECTOMY    . VASECTOMY       Current Outpatient Medications:  .  amLODipine (NORVASC) 10 MG tablet, Take 1 tablet (10 mg total) by mouth daily., Disp: 90 tablet, Rfl: 3 .  atorvastatin (LIPITOR) 40 MG tablet, Take 1 tablet (40 mg total) by mouth daily at 6 PM., Disp: 30 tablet, Rfl: 3 .  cholecalciferol (VITAMIN D3) 25 MCG (1000 UT) tablet, Take by mouth., Disp: , Rfl:  .  glucose blood (BAYER CONTOUR TEST) test strip, Use as instructed, Disp: 100 each, Rfl: 12 .  losartan (COZAAR) 25 MG tablet, Take 1 tablet (25 mg total) by mouth daily., Disp: 30 tablet, Rfl: 3 .  metFORMIN (GLUCOPHAGE) 500 MG  tablet, Take 1 tablet (500 mg total) by mouth 2 (two) times daily with a meal., Disp: 180 tablet, Rfl: 3 .  Multiple Vitamin (MULTIVITAMIN) tablet, Take by mouth., Disp: , Rfl:  .  Omega-3 Fatty Acids (FISH OIL) 1000 MG CAPS, Take by mouth., Disp: , Rfl:  .  QC ENTERIC ASPIRIN 325 MG EC tablet, , Disp: , Rfl: 0 .  raNITIdine HCl (ZANTAC PO), Take by mouth., Disp: , Rfl:  .  temazepam (RESTORIL) 30 MG capsule, Take 1 capsule (30 mg total) by mouth at bedtime as needed for sleep., Disp: 30 capsule, Rfl: 1 .  Turmeric 450 MG CAPS, Take by mouth., Disp: , Rfl:  .  Vitamin D, Ergocalciferol, (DRISDOL) 1.25 MG (50000 UT) CAPS capsule, , Disp: , Rfl:  .  vortioxetine HBr (TRINTELLIX) 5 MG TABS tablet, Take 1 tablet (5 mg total) by mouth daily for 7 days, THEN 2 tablets (10 mg total) daily for 23 days., Disp: 53 tablet, Rfl: 0 .  buPROPion (WELLBUTRIN XL) 150 MG 24 hr tablet, Take 1 tablet (150 mg total) by mouth daily for 14 days., Disp: 14 tablet, Rfl: 0 .  gabapentin (NEURONTIN) 100 MG capsule, Take 1 capsule (100 mg total) by mouth at bedtime.,  Disp: 90 capsule, Rfl: 0  No Known Allergies      Objective:  Physical Exam  General: AAO x3, NAD  Dermatological: Skin is warm, dry and supple bilateral. There are no open sores, no preulcerative lesions, no rash or signs of infection present.  Vascular: Dorsalis Pedis artery and Posterior Tibial artery pedal pulses are 2/4 bilateral with immedate capillary fill time.  There is no pain with calf compression, swelling, warmth, erythema.   Neruologic: Overall sensation appears to be intact with Phoebe Perch monofilament.  So a decreased vibratory sensation on the right side.  Negative Tinel sign.  Musculoskeletal: Unable to elicit any area of pinpoint tenderness.  There is no erythema induration warmth bilaterally.  Ankle, subtalar joint range of motion intact.  Muscular strength 5/5 in all groups tested bilateral.  Gait: Unassisted, Nonantalgic.         Assessment:   58 year old male with likely neuropathy bilaterally    Plan:  -Treatment options discussed including all alternatives, risks, and complications -Etiology of symptoms were discussed -X-rays were obtained and reviewed with the patient.  There is no evidence of acute fracture or stress fracture identified.  Mild spurring present.  Extensor navicular. -We discussed his symptoms and I do think that he is experiencing neuropathy symptoms.  We discussed natural progression of this as well as glucose control.  We discussed a B complex vitamin.  Also will start gabapentin 100 mg at nighttime we discussed titrating up based if he can tolerate that.  He is to call me know how he is doing.  We can start 100 mg at nighttime he is tolerating up to 200 mg then 300 mg.  I will see him back in 4 weeks or sooner if needed.  Vivi Barrack DPM

## 2019-02-20 NOTE — Patient Instructions (Signed)
Diabetes Mellitus and Foot Care Foot care is an important part of your health, especially when you have diabetes. Diabetes may cause you to have problems because of poor blood flow (circulation) to your feet and legs, which can cause your skin to:  Become thinner and drier.  Break more easily.  Heal more slowly.  Peel and crack. You may also have nerve damage (neuropathy) in your legs and feet, causing decreased feeling in them. This means that you may not notice minor injuries to your feet that could lead to more serious problems. Noticing and addressing any potential problems early is the best way to prevent future foot problems. How to care for your feet Foot hygiene  Wash your feet daily with warm water and mild soap. Do not use hot water. Then, pat your feet and the areas between your toes until they are completely dry. Do not soak your feet as this can dry your skin.  Trim your toenails straight across. Do not dig under them or around the cuticle. File the edges of your nails with an emery board or nail file.  Apply a moisturizing lotion or petroleum jelly to the skin on your feet and to dry, brittle toenails. Use lotion that does not contain alcohol and is unscented. Do not apply lotion between your toes. Shoes and socks  Wear clean socks or stockings every day. Make sure they are not too tight. Do not wear knee-high stockings since they may decrease blood flow to your legs.  Wear shoes that fit properly and have enough cushioning. Always look in your shoes before you put them on to be sure there are no objects inside.  To break in new shoes, wear them for just a few hours a day. This prevents injuries on your feet. Wounds, scrapes, corns, and calluses  Check your feet daily for blisters, cuts, bruises, sores, and redness. If you cannot see the bottom of your feet, use a mirror or ask someone for help.  Do not cut corns or calluses or try to remove them with medicine.  If you  find a minor scrape, cut, or break in the skin on your feet, keep it and the skin around it clean and dry. You may clean these areas with mild soap and water. Do not clean the area with peroxide, alcohol, or iodine.  If you have a wound, scrape, corn, or callus on your foot, look at it several times a day to make sure it is healing and not infected. Check for: ? Redness, swelling, or pain. ? Fluid or blood. ? Warmth. ? Pus or a bad smell. General instructions  Do not cross your legs. This may decrease blood flow to your feet.  Do not use heating pads or hot water bottles on your feet. They may burn your skin. If you have lost feeling in your feet or legs, you may not know this is happening until it is too late.  Protect your feet from hot and cold by wearing shoes, such as at the beach or on hot pavement.  Schedule a complete foot exam at least once a year (annually) or more often if you have foot problems. If you have foot problems, report any cuts, sores, or bruises to your health care provider immediately. Contact a health care provider if:  You have a medical condition that increases your risk of infection and you have any cuts, sores, or bruises on your feet.  You have an injury that is not   healing.  You have redness on your legs or feet.  You feel burning or tingling in your legs or feet.  You have pain or cramps in your legs and feet.  Your legs or feet are numb.  Your feet always feel cold.  You have pain around a toenail. Get help right away if:  You have a wound, scrape, corn, or callus on your foot and: ? You have pain, swelling, or redness that gets worse. ? You have fluid or blood coming from the wound, scrape, corn, or callus. ? Your wound, scrape, corn, or callus feels warm to the touch. ? You have pus or a bad smell coming from the wound, scrape, corn, or callus. ? You have a fever. ? You have a red line going up your leg. Summary  Check your feet every day  for cuts, sores, red spots, swelling, and blisters.  Moisturize feet and legs daily.  Wear shoes that fit properly and have enough cushioning.  If you have foot problems, report any cuts, sores, or bruises to your health care provider immediately.  Schedule a complete foot exam at least once a year (annually) or more often if you have foot problems. This information is not intended to replace advice given to you by your health care provider. Make sure you discuss any questions you have with your health care provider. Document Released: 12/04/2000 Document Revised: 01/19/2018 Document Reviewed: 01/08/2017 Elsevier Interactive Patient Education  2019 Elsevier Inc.  Gabapentin capsules or tablets What is this medicine? GABAPENTIN (GA ba pen tin) is used to control seizures in certain types of epilepsy. It is also used to treat certain types of nerve pain. This medicine may be used for other purposes; ask your health care provider or pharmacist if you have questions. COMMON BRAND NAME(S): Active-PAC with Gabapentin, Gabarone, Neurontin What should I tell my health care provider before I take this medicine? They need to know if you have any of these conditions: -kidney disease -suicidal thoughts, plans, or attempt; a previous suicide attempt by you or a family member -an unusual or allergic reaction to gabapentin, other medicines, foods, dyes, or preservatives -pregnant or trying to get pregnant -breast-feeding How should I use this medicine? Take this medicine by mouth with a glass of water. Follow the directions on the prescription label. You can take it with or without food. If it upsets your stomach, take it with food. Take your medicine at regular intervals. Do not take it more often than directed. Do not stop taking except on your doctor's advice. If you are directed to break the 600 or 800 mg tablets in half as part of your dose, the extra half tablet should be used for the next dose.  If you have not used the extra half tablet within 28 days, it should be thrown away. A special MedGuide will be given to you by the pharmacist with each prescription and refill. Be sure to read this information carefully each time. Talk to your pediatrician regarding the use of this medicine in children. While this drug may be prescribed for children as young as 3 years for selected conditions, precautions do apply. Overdosage: If you think you have taken too much of this medicine contact a poison control center or emergency room at once. NOTE: This medicine is only for you. Do not share this medicine with others. What if I miss a dose? If you miss a dose, take it as soon as you can. If it is   is almost time for your next dose, take only that dose. Do not take double or extra doses. What may interact with this medicine? Do not take this medicine with any of the following medications: -other gabapentin products This medicine may also interact with the following medications: -alcohol -antacids -antihistamines for allergy, cough and cold -certain medicines for anxiety or sleep -certain medicines for depression or psychotic disturbances -homatropine; hydrocodone -naproxen -narcotic medicines (opiates) for pain -phenothiazines like chlorpromazine, mesoridazine, prochlorperazine, thioridazine This list may not describe all possible interactions. Give your health care provider a list of all the medicines, herbs, non-prescription drugs, or dietary supplements you use. Also tell them if you smoke, drink alcohol, or use illegal drugs. Some items may interact with your medicine. What should I watch for while using this medicine? Visit your doctor or health care professional for regular checks on your progress. You may want to keep a record at home of how you feel your condition is responding to treatment. You may want to share this information with your doctor or health care professional at each visit. You  should contact your doctor or health care professional if your seizures get worse or if you have any new types of seizures. Do not stop taking this medicine or any of your seizure medicines unless instructed by your doctor or health care professional. Stopping your medicine suddenly can increase your seizures or their severity. Wear a medical identification bracelet or chain if you are taking this medicine for seizures, and carry a card that lists all your medications. You may get drowsy, dizzy, or have blurred vision. Do not drive, use machinery, or do anything that needs mental alertness until you know how this medicine affects you. To reduce dizzy or fainting spells, do not sit or stand up quickly, especially if you are an older patient. Alcohol can increase drowsiness and dizziness. Avoid alcoholic drinks. Your mouth may get dry. Chewing sugarless gum or sucking hard candy, and drinking plenty of water will help. The use of this medicine may increase the chance of suicidal thoughts or actions. Pay special attention to how you are responding while on this medicine. Any worsening of mood, or thoughts of suicide or dying should be reported to your health care professional right away. Women who become pregnant while using this medicine may enroll in the [place] American Antiepileptic Drug Pregnancy Registry by calling [phone]. This registry collects information about the safety of antiepileptic drug use during pregnancy. What side effects may I notice from receiving this medicine? Side effects that you should report to your doctor or health care professional as soon as possible: -allergic reactions like skin rash, itching or hives, swelling of the face, lips, or tongue -worsening of mood, thoughts or actions of suicide or dying Side effects that usually do not require medical attention (report to your doctor or health care professional if they continue or are bothersome): -constipation -difficulty  walking or controlling muscle movements -dizziness -nausea -slurred speech -tiredness -tremors -weight gain This list may not describe all possible side effects. Call your doctor for medical advice about side effects. You may report side effects to FDA at 1-800-FDA-1088. Where should I keep my medicine? Keep out of reach of children. This medicine may cause accidental overdose and death if it taken by other adults, children, or pets. Mix any unused medicine with a substance like cat litter or coffee grounds. Then throw the medicine away in a sealed container like a sealed bag or a coffee can  with a lid. Do not use the medicine after the expiration date. Store at room temperature between 15 and 30 degrees C (59 and 86 degrees F). NOTE: This sheet is a summary. It may not cover all possible information. If you have questions about this medicine, talk to your doctor, pharmacist, or health care provider.  2019 Elsevier/Gold Standard (2018-05-12 13:21:44)

## 2019-02-22 ENCOUNTER — Telehealth (HOSPITAL_COMMUNITY): Payer: Self-pay

## 2019-02-22 ENCOUNTER — Ambulatory Visit (INDEPENDENT_AMBULATORY_CARE_PROVIDER_SITE_OTHER): Payer: BLUE CROSS/BLUE SHIELD | Admitting: Psychiatry

## 2019-02-22 DIAGNOSIS — F4323 Adjustment disorder with mixed anxiety and depressed mood: Secondary | ICD-10-CM | POA: Diagnosis not present

## 2019-02-22 DIAGNOSIS — R413 Other amnesia: Secondary | ICD-10-CM | POA: Diagnosis not present

## 2019-02-22 DIAGNOSIS — F331 Major depressive disorder, recurrent, moderate: Secondary | ICD-10-CM | POA: Diagnosis not present

## 2019-02-22 NOTE — Telephone Encounter (Signed)
Medication management - Pt was in for therapy appt today and wanted to let Dr. Hinton Dyer know Dr. Ardelle Anton started him on Neurontin 100 mg at bedtime and now that he is taking Restoril 30 mg total at bedtime it made him really tired today.  States he wants to continue with Restoril 30 mg at bedtime for now but if continues to make him feel too tired the next day with Neurontin he will call back and request to cut back to the Restoril 15 mg at that time.  Patient wanted Dr. Hinton Dyer to be informed of this.

## 2019-02-22 NOTE — Telephone Encounter (Signed)
Pt came into the office to check on this, I do not see documentation we recieved this. Do we have it? Please advsie

## 2019-02-23 ENCOUNTER — Telehealth: Payer: Self-pay

## 2019-02-23 ENCOUNTER — Encounter (HOSPITAL_COMMUNITY): Payer: Self-pay | Admitting: Psychiatry

## 2019-02-23 NOTE — Telephone Encounter (Signed)
Copied from CRM [id number]. Topic: General - Inquiry >> Feb 23, 2019 10:52 AM Terisa Starr wrote: Reason for CRM: Patient would like to know if Dr Okey Dupre received his [facility] , he said that he came by yesterday and nobody had seen it but was advised Dr Okey Dupre may have it. Please advise.

## 2019-02-23 NOTE — Telephone Encounter (Signed)
LVM informing patient that we have not received any paperwork from [employer]

## 2019-02-24 NOTE — Telephone Encounter (Signed)
I haven't seen anything.

## 2019-02-24 NOTE — Telephone Encounter (Signed)
Patient is calling to check if his [employer] paperwork was received.

## 2019-02-24 NOTE — Progress Notes (Signed)
Client: Curtis Lam  Date: 02/22/19  Time: 3:30-4:25 pm  Type of Therapy: Individual Therapy  Axis I Diagnosis: Adjustment Disorder with mixed anxiety and depressed mood; Major Depression, moderate recurrent  Treatment goals addressed: To develop healthy coping skills to use in the workplace to better manage stress and anxiety. Process childhood traumas to alleviate psychiatric symptoms of intermittent anxiety and depression.  Interventions: CBT, Motivational Interviewing, Psychoeducation, Coping Skill Building  Summary: Client Curtis Lam, 58yo male who presents with Adjustment Disorder and Major Depressive Disorder, due to recent stroke (second one in a year). Counselor using therapeutic interventions to address symptoms of angry outbursts, anxiety, black outs, decrease in work-performance and to prepare for starting medication management and discussing his vocational options with his employer.  Therapist Response: Curtis Lam met with Counselor for individual therapy. Counselor joined with Curtis Lam as he shared that his medication changes have improved his somatic symptoms, but not his sleep, balance or memory. Counselor assessed current psychiatric symptoms and life stressors with the Curtis Lam. Curtis Lam shared that overall the past week he has a few "down" days, but attributes most of his concerns to the change in medications. Counselor expressed concerned about the regression in his memory functioning, and urged him to set an appointment with his Neurologist to determine the issue and receive recommendations. Counselor and Curtis Lam discussed coping skills to help with memory issues and how to communicate concerns with wife/doctors. Curtis Lam reported that he had requested an extension to his time from work due to the increased issues. Counselor processed anxieties and depressive symptoms that are a reaction to the lack of control he feels related to his health issues. Counselor encouraged utilization of cognitive  coping skills, structured routine, self-care regimen and increasing communication and connection with his wife. Counselor connected him with an on-site nurse to discuss medication issues.  Suicidal/Homicidal: No current safety concerns. Denies any current suicidal ideation. On 2/13, he mentioned thoughts of wanting to drive into a tree or off a bridge, when he is overcome with anxiety. He reports having his wife as a motivator to refrain from acting on thoughts. No clear plan/intent to harm self or others.  Plan: To return in 1 week. Will apply skills learned in session at home, until next session.  Curtis Auer, LCSW

## 2019-02-24 NOTE — Telephone Encounter (Signed)
Patient is calling to see if the paperwork has been received. [employer] said she sent it again at 11:29 and did receive a confirmation. He would like a call back [phone]

## 2019-02-27 NOTE — Telephone Encounter (Signed)
Yes I called him and left a voicemail sometime last week we do not have any paperwork. I can call him before lunch or if you can call him before that, that would be great.

## 2019-02-27 NOTE — Telephone Encounter (Signed)
Pt called once again to see if the paperwork was received. Pt requests a call back to advise. Cb# [phone]

## 2019-02-27 NOTE — Telephone Encounter (Signed)
[employer] [phone] ext [phone] Elgie Collard.

## 2019-02-27 NOTE — Telephone Encounter (Signed)
LVM asking Curtis Lam to fax the [facility] to [place] again.

## 2019-02-27 NOTE — Telephone Encounter (Signed)
Noted thanks °

## 2019-03-01 ENCOUNTER — Telehealth (HOSPITAL_COMMUNITY): Payer: Self-pay

## 2019-03-01 ENCOUNTER — Ambulatory Visit (INDEPENDENT_AMBULATORY_CARE_PROVIDER_SITE_OTHER): Payer: BLUE CROSS/BLUE SHIELD | Admitting: Psychiatry

## 2019-03-01 ENCOUNTER — Other Ambulatory Visit: Payer: Self-pay

## 2019-03-01 DIAGNOSIS — F331 Major depressive disorder, recurrent, moderate: Secondary | ICD-10-CM

## 2019-03-01 DIAGNOSIS — F4323 Adjustment disorder with mixed anxiety and depressed mood: Secondary | ICD-10-CM | POA: Diagnosis not present

## 2019-03-01 DIAGNOSIS — R413 Other amnesia: Secondary | ICD-10-CM | POA: Diagnosis not present

## 2019-03-01 NOTE — Telephone Encounter (Signed)
Patient came in for therapy and told me that he was experiencing some weird feeling since starting the new medications. Patient told me that he was out in his garden and was kneeling down for awhile, when he got up his whole body was tingling and he felt lightheaded. I explained that this could be from being out in the sun, kneeling for a long time and then standing up real fast. I did Orthostatic BP sitting - 160/84, standing 177/82. Patient says it does not happen all the time, but it is concerning. Please review and advise, thank you

## 2019-03-02 ENCOUNTER — Encounter (HOSPITAL_COMMUNITY): Payer: Self-pay | Admitting: Psychiatry

## 2019-03-02 NOTE — Telephone Encounter (Signed)
I called the patient back and advised him to contact his PCP

## 2019-03-02 NOTE — Telephone Encounter (Signed)
It does sound like orthostatic hypotension but his new antidepressant (Trintellix) is not known to cause this type of side effect? His only other new medication is gabapentin (not prescribed by me) and again it is not likely to affect blood pressure. In summary I do not know if medciations can explain what he is experiencing. OP

## 2019-03-02 NOTE — Progress Notes (Signed)
Client: Antino Mayabb  Date: 03/01/19  Time: 3:32-4:26 pm  Type of Therapy: Individual Therapy  Axis I Diagnosis: Adjustment Disorder with mixed anxiety and depressed mood; Major Depression, moderate recurrent  Treatment goals addressed: To develop healthy coping skills to use in the workplace to better manage stress and anxiety. Process childhood traumas to alleviate psychiatric symptoms of intermittent anxiety and depression.  Interventions: CBT, Motivational Interviewing, Psychoeducation, Coping Skill Building  Summary: Client Mousa, 58yo male who presents with Adjustment Disorder and Major Depressive Disorder, due to recent stroke (second one in a year). Counselor using therapeutic interventions to address symptoms of angry outbursts, anxiety, black outs, decrease in work-performance and to prepare for starting medication management and discussing his vocational options with his employer.  Therapist Response: [name] met with Counselor for individual therapy. Counselor joined with [name] as he shared about his health concerns and decline in health, safety concerns, memory issues, "bizarre dreams" and current coping skills. Counselor assessed current psychiatric symptoms and life stressors with the [name]. Bevin reported increased memory issues, depression and anxiety related to his health concerns and outlook for the future, decreased energy level and reported improved sleep. Counselor processed current coping skills he is using to address the memory problems and managing his depression and anxiety. Teven reported that he's been able to be outside more working on his garden and spending time with his dogs. Josejulian has tweaked his daily routine to have more reminders for tasks, but continues to find himself forgetting things and loosing things. Counselor recommended communicating his concerns with a medical professional and connected him with psyc nurse after the session. Counselor expressed  concerns for his safety while alone in his yard, home and while driving, as he shared incidents that happened over the past week of falling, feeling faint and inability to feel his feet while driving. [name] assured counselor that he would communicate these concerns with his wife and medical professionals to determine changes in meds and supports to keep him safe.  Suicidal/Homicidal: No current safety concerns. Denies any current suicidal ideation. On 2/13, he mentioned thoughts of wanting to drive into a tree or off a bridge, when he is overcome with anxiety. He reports having his wife as a motivator to refrain from acting on thoughts. No clear plan/intent to harm self or others.  Plan: To return in 1 week. Will apply skills learned in session at home, until next session.  [name], LCSW

## 2019-03-06 ENCOUNTER — Encounter: Payer: Self-pay | Admitting: Internal Medicine

## 2019-03-06 ENCOUNTER — Encounter: Payer: Self-pay | Admitting: Podiatry

## 2019-03-07 NOTE — Telephone Encounter (Signed)
fyi

## 2019-03-07 NOTE — Telephone Encounter (Addendum)
Paperwork has been received. Per Dr Okey Dupre, we are needing to know what dates he is requesting and to see if Psych would consider filling this out in the future. Left message for patient to call back.  Patient is scheduled to be seen tomorrow (03/08/2019).  **[employer] has paperwork.

## 2019-03-08 ENCOUNTER — Ambulatory Visit (INDEPENDENT_AMBULATORY_CARE_PROVIDER_SITE_OTHER): Payer: BLUE CROSS/BLUE SHIELD | Admitting: Psychiatry

## 2019-03-08 ENCOUNTER — Other Ambulatory Visit: Payer: Self-pay

## 2019-03-08 ENCOUNTER — Encounter: Payer: Self-pay | Admitting: Internal Medicine

## 2019-03-08 ENCOUNTER — Telehealth: Payer: Self-pay

## 2019-03-08 ENCOUNTER — Encounter (HOSPITAL_COMMUNITY): Payer: Self-pay | Admitting: Psychiatry

## 2019-03-08 ENCOUNTER — Ambulatory Visit: Payer: BLUE CROSS/BLUE SHIELD | Admitting: Internal Medicine

## 2019-03-08 VITALS — BP 140/90 | HR 74 | Temp 98.2°F | Ht 67.0 in | Wt 198.0 lb

## 2019-03-08 DIAGNOSIS — R41840 Attention and concentration deficit: Secondary | ICD-10-CM | POA: Diagnosis not present

## 2019-03-08 DIAGNOSIS — E1140 Type 2 diabetes mellitus with diabetic neuropathy, unspecified: Secondary | ICD-10-CM

## 2019-03-08 DIAGNOSIS — F4323 Adjustment disorder with mixed anxiety and depressed mood: Secondary | ICD-10-CM | POA: Diagnosis not present

## 2019-03-08 DIAGNOSIS — R413 Other amnesia: Secondary | ICD-10-CM

## 2019-03-08 DIAGNOSIS — I10 Essential (primary) hypertension: Secondary | ICD-10-CM

## 2019-03-08 LAB — POCT GLYCOSYLATED HEMOGLOBIN (HGB A1C): Hemoglobin A1C: 9 % — AB (ref 4.0–5.6)

## 2019-03-08 MED ORDER — METFORMIN HCL 1000 MG PO TABS: 1000.0000 mg | ORAL_TABLET | Freq: Two times a day (BID) | ORAL | 3 refills | Status: DC

## 2019-03-08 NOTE — Telephone Encounter (Signed)
FYI  Before patient left he stated that he keeps forgetting to mention to you that he was reading articles related to MS and lyme disease that have hit spot on with the symptoms that he has been discussing with you. States he was bit by a tick some time ago and was in an area he didn't realize he was bit and could not see if there was a bullseye. States that he just wanted to make you aware of his findings as he felt maybe there was something there. States he did have a conversation about it with his therapist as well.

## 2019-03-08 NOTE — Assessment & Plan Note (Signed)
Will recommend that the short term disability policy contact the patient's psychiatrist regarding his expected return to work date as they are aware of this and patient had no expectations of return to work date so I do not have accurate information regarding this.

## 2019-03-08 NOTE — Assessment & Plan Note (Signed)
Suspect some mild dehydration due to diuresis from sugars. Adjusting sugar treatment and advised patient to drink plenty of fluids. Keep amlodipine and losartan due to recent stroke he needs tight BP control.

## 2019-03-08 NOTE — Assessment & Plan Note (Signed)
Uncontrolled at this time, poc HgA1c 9.0 in the office. Will double metformin to max dosing 1000 mg BID. Back in 3 months for assessment. Reminded about eye exam. Taking ARB and statin.

## 2019-03-08 NOTE — Patient Instructions (Addendum)
We will double the dose of the metformin to help with the sugars. Until you run out of what you have take  2 pills twice a day.   When you get the refill make sure to get the 1000 mg tablets and then take 1 pill twice a day.

## 2019-03-08 NOTE — Progress Notes (Signed)
   Subjective:   Patient ID: Curtis Lam, male    DOB: [birth year], 58 y.o.   MRN: [id number]  HPI The patient is a 58 YO man coming in for follow up of diabetes (prior stroke, sugars running mostly 200-280 in the morning, denies dietary changes, taking metformin 500 mg BID currently, denies GI side effects, denies new numbness or tingling in feet), and mental state (seeing pysch for medication adjustment and is out of short term disability, he is requesting extension but states he does not know how long, however long you guys think he needs, next visit with psych 03/18/19 and leave due to expire 03/17/19, psych is managing meds, he states he is making some progress but concentration is limited, recent stroke which could be impacting mental clarity, prior depression and anxiety and possible undiagnosed add per patient) and some concerns about blood pressure (some dizziness with standing quickly since sugars are running high, still taking amlodipine and losartan without changes).   Review of Systems  Constitutional: Negative.   HENT: Negative.   Eyes: Negative.   Respiratory: Negative for cough, chest tightness and shortness of breath.   Cardiovascular: Negative for chest pain, palpitations and leg swelling.  Gastrointestinal: Negative for abdominal distention, abdominal pain, constipation, diarrhea, nausea and vomiting.  Musculoskeletal: Negative.   Skin: Negative.   Neurological: Negative.   Psychiatric/Behavioral: Positive for decreased concentration, dysphoric mood and sleep disturbance. The patient is nervous/anxious.     Objective:  Physical Exam Constitutional:      Appearance: He is well-developed.  HENT:     Head: Normocephalic and atraumatic.  Neck:     Musculoskeletal: Normal range of motion.  Cardiovascular:     Rate and Rhythm: Normal rate and regular rhythm.  Pulmonary:     Effort: Pulmonary effort is normal. No respiratory distress.     Breath sounds: Normal breath  sounds. No wheezing or rales.  Abdominal:     General: Bowel sounds are normal. There is no distension.     Palpations: Abdomen is soft.     Tenderness: There is no abdominal tenderness. There is no rebound.  Skin:    General: Skin is warm and dry.  Neurological:     Mental Status: He is alert and oriented to person, place, and time.     Coordination: Coordination normal.     Vitals:   03/08/19 0832  BP: 140/90  Pulse: 74  Temp: 98.2 F (36.8 C)  TempSrc: Oral  SpO2: 97%  Weight: 198 lb (89.8 kg)  Height: 5\' 7"  (1.702 m)    Assessment & Plan:

## 2019-03-08 NOTE — Telephone Encounter (Signed)
Noted  

## 2019-03-09 NOTE — Progress Notes (Signed)
Client: Carmeron Bywater  Date: 03/08/19  Time: 3:32-4:26 pm  Type of Therapy: Individual Therapy  Axis I Diagnosis: Adjustment Disorder with mixed anxiety and depressed mood; Major Depression, moderate recurrent  Treatment goals addressed: To develop healthy coping skills to use in the workplace to better manage stress and anxiety. Process childhood traumas to alleviate psychiatric symptoms of intermittent anxiety and depression.  Interventions: CBT, Motivational Interviewing, Psychoeducation, Coping Skill Building  Summary: Client Laurice, 58yo male who presents with Adjustment Disorder and Major Depressive Disorder, due to recent stroke (second one in a year). Counselor using therapeutic interventions to address symptoms of angry outbursts, anxiety, black outs, decrease in work-performance and to prepare for starting medication management and discussing his vocational options with his employer.  Therapist Response: Christoffer met with Counselor for individual therapy. Counselor joined with La as he shared about more disturbing dreams/flashbacks, health concerns, Disability Claim status and memory issues. Counselor assessed current psychiatric symptoms and life stressors with the Curly. Nicholis reported that his health continues to decline, which causes him concern and increased anxiety. Damyan denied feelings of hopelessness and depression. Kimberley's short-term memory issues persist and cause him to forget taking medications and remembering where he is going and what he is doing. Counselor reviewed coping strategies to address memory issues. Counselor and Shante practiced skills in real time utilizing his cell phone to hold information he needs to remember. Andy expressed concern that he will be unable to return to work with his new ailments and constant memory problems. He feels as though he would be a liability and unsafe on the job. Yuriy is working to extend his time from work. Counselor  provided psychoeducation on flashbacks and dream and their relation to past traumas, how the "body keeps the score". Kaimen denied any suicidal ideations this week and did not become distressed discussing his SI history, dating back to childhood.  Suicidal/Homicidal: No current safety concerns. Denies any current suicidal ideation. On 2/13, he mentioned thoughts of wanting to drive into a tree or off a bridge, when he is overcome with anxiety. He reports having his wife as a motivator to refrain from acting on thoughts. No clear plan/intent to harm self or others.  Plan: To return in 1 week. Will apply skills learned in session at home, until next session.   , LCSW   

## 2019-03-14 ENCOUNTER — Encounter: Payer: Self-pay | Admitting: Internal Medicine

## 2019-03-15 ENCOUNTER — Other Ambulatory Visit: Payer: Self-pay

## 2019-03-15 ENCOUNTER — Encounter (HOSPITAL_COMMUNITY): Payer: Self-pay | Admitting: Psychiatry

## 2019-03-15 ENCOUNTER — Ambulatory Visit (INDEPENDENT_AMBULATORY_CARE_PROVIDER_SITE_OTHER): Payer: BLUE CROSS/BLUE SHIELD | Admitting: Psychiatry

## 2019-03-15 DIAGNOSIS — R413 Other amnesia: Secondary | ICD-10-CM | POA: Diagnosis not present

## 2019-03-15 DIAGNOSIS — F4323 Adjustment disorder with mixed anxiety and depressed mood: Secondary | ICD-10-CM

## 2019-03-15 NOTE — Progress Notes (Signed)
Virtual Visit via Video Note  I connected with Curtis Lam on 03/15/19 at  3:30 PM EDT by a video enabled telemedicine application and verified that I am speaking with the correct person using two identifiers.   I discussed the limitations of evaluation and management by telemedicine and the availability of in person appointments. The patient expressed understanding and agreed to proceed.  History of Present Illness: Adjustment Disorder with Mixed anxiety and depressed mood; Memory Difficulties due to stroke and inability to work.   Observations/Objective: Jagur reports improvement in memory and mood this week. He reports having a decline in health again this week with additional hip issues and intense sharp pains in his brain (headaches, only on one side). The numbness in his feet has improved as well. He believes he is more at an acceptance stage that he will not be returning to work in the capacity or position he left. He is feeling less anxious about the work situation but remains uncertain as to what will be a good fit vocation wise. He does not believe it would be safe for him or his co-workers for him to return in his physical and mental condition. He reports improvement in depression and lower levels of anxiety.  Assessment and Plan:  Counselor assessed mental health and suicidal ideations. Enki denies any suicidal thoughts/ Counselor processed and encouraged implementation of coping skills and strategies to address mood and memory functioning. We discussed the need to decrease frequency of therapy sessions. He is open to meeting every 3 weeks and meeting through telehealth is helpful for him. Counselor encouraged continued improvement and closeness with his wife, as she is his main support.    Follow Up Instructions: Serigo will set up the next appoint for 3 weeks from now. He will communicate any changes in mood with his wife, especially if any suicidal ideations present. He will  continue to implement a structured routine and utilization of coping skills to manage mental health. He will see Psychiatrist this week and continue taking medications as prescribed.    I discussed the assessment and treatment plan with the patient. The patient was provided an opportunity to ask questions and all were answered. The patient agreed with the plan and demonstrated an understanding of the instructions.   The patient was advised to call back or seek an in-person evaluation if the symptoms worsen or if the condition fails to improve as anticipated.  I provided 53 minutes of non-face-to-face time during this encounter.   Hilbert Odor, LCSW

## 2019-03-17 ENCOUNTER — Telehealth (INDEPENDENT_AMBULATORY_CARE_PROVIDER_SITE_OTHER): Payer: BLUE CROSS/BLUE SHIELD | Admitting: Psychiatry

## 2019-03-17 ENCOUNTER — Other Ambulatory Visit: Payer: Self-pay

## 2019-03-17 DIAGNOSIS — F411 Generalized anxiety disorder: Secondary | ICD-10-CM | POA: Diagnosis not present

## 2019-03-17 DIAGNOSIS — F330 Major depressive disorder, recurrent, mild: Secondary | ICD-10-CM | POA: Diagnosis not present

## 2019-03-17 MED ORDER — VORTIOXETINE HBR 10 MG PO TABS: 10.0000 mg | ORAL_TABLET | Freq: Every day | ORAL | 2 refills | Status: DC

## 2019-03-17 MED ORDER — BUPROPION HCL ER (SR) 100 MG PO TB12: 100.0000 mg | ORAL_TABLET | Freq: Two times a day (BID) | ORAL | 0 refills | Status: DC

## 2019-03-17 NOTE — BH Specialist Note (Signed)
BH MD/PA/NP OP Progress Note  03/17/2019 8:46 AM Curtis Lam  MRN:  [id number]  Interview was conducted using teleconferencing and I verified that I was speaking with the correct person using two identifiers. I discussed the limitations of evaluation and management by telemedicine and  the availability of in person appointments. Patient expressed understanding and agreed to proceed.  Chief Complaint: Fatigue, anxiety  HPI: 58 yo married male with GAD and MDD who has not responded well to a new antidepressant duloxetine as he cannot tolerate adverse GI effect. Poor sleep on Restoril 15 mg but  improved on a higher dose of 30 mg. Continues to complain about  fatigue/poor concentration. He reports that his cognition was better when he was on Wellbutrin XL 300 mg (brand name), but did not on generic bupropion XL. He also failed a trial of venlafaxine in the past. He has had two CVAs which could have affected his cognition. He may also has an undiagnosed ADD but no hx of cognitive struggles growing up is known.We have discontinued duloxetine and instead started vortioxetine 5 mg daily for 7 days then increased dose to 10 mg daily. He still reports nausea/hartburn in am for which he takes Zantac and Prevacid prn (OTC). Symptoms are however less pronounced than when he was taking duloxetine.  He is continuing in counseling with Hilbert Odor LCSW. He complains of hip pain, has elevated hemoglobin A1C (matformin dose was just doubled) and mildly elevated BP. 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  Visit Diagnosis:    ICD-10-CM   1. Generalized anxiety disorder F41.1   2. Mild episode of recurrent major depressive disorder (HCC) F33.0     Past Psychiatric History: Please refer to intake H&P.  Past Medical History:  Past Medical History:  Diagnosis Date  . Anxiety   . Depression   . Diabetes mellitus, type II (HCC)   . Elevated cholesterol   . History  of migraine headaches    resolved s/p stopping ibuprofen  . Rectal polyp   . Schizoaffective disorder (HCC)   . 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 COLONOSCOPY  01/02/13  . TONSILLECTOMY    . VASECTOMY      Family Psychiatric History: reviewed  Family History:  Family History  Problem Relation Age of Onset  . Alzheimer's disease Father   . Depression Sister   . Diabetes Son 30  . Cancer Paternal Aunt        uterine, ovarian  . Diabetes Maternal Grandfather   . Heart disease Maternal Grandfather     Social History:  Social History   Socioeconomic History  . Marital status: Married    Spouse name: Not on file  . Number of children: 2  . Years of education: Not on file  . Highest education level: High school graduate  Occupational History  . Occupation: Engineer, mining)    Employer: [employer]  Social Needs  . Financial resource strain: Not hard at all  . Food insecurity:    Worry: Never true    Inability: Never true  . Transportation needs:    Medical: No    Non-medical: No  Tobacco Use  . Smoking status: Never Smoker  . Smokeless tobacco: Never Used  Substance and Sexual Activity  . Alcohol use: Yes    Alcohol/week: 1.0 standard drinks    Types: 1  Cans of beer per week    Comment: 1 beer or wine 2-3 times per month, more in summer than winter  . Drug use: No  . Sexual activity: Yes    Partners: Female  Lifestyle  . Physical activity:    Days per week: 0 days    Minutes per session: 0 min  . Stress: Very much  Relationships  . Social connections:    Talks on phone: Not on file    Gets together: Not on file    Attends religious service: Never    Active member of club or organization: Not on file    Attends meetings of clubs or organizations: Not on file    Relationship status: Married  Other Topics Concern  . Not on file  Social History Narrative   Lives with wife and  son.  Other son lives in [place].  Wife suffers from depression, recent flare and hospitalization (12/2013); He lost his job 09/2014 for 4-5 weeks, and got new job    Allergies: No Known Allergies  Metabolic Disorder Labs: Lab Results  Component Value Date   HGBA1C 9.0 (A) 03/08/2019   MPG 126 (H) 01/23/2015   MPG 120 (H) 12/25/2013   No results found for: PROLACTIN Lab Results  Component Value Date   CHOL 193 10/04/2018   TRIG 211.0 (H) 10/04/2018   HDL 43.00 10/04/2018   CHOLHDL 4 10/04/2018   VLDL 42.2 (H) 10/04/2018   LDLCALC 110 (H) 08/11/2017   LDLCALC 84 08/13/2016   Lab Results  Component Value Date   TSH 2.589 01/23/2015   TSH 2.765 12/25/2013    Therapeutic Level Labs: No results found for: LITHIUM No results found for: VALPROATE No components found for:  CBMZ  Current Medications: Current Outpatient Medications  Medication Sig Dispense Refill  . amLODipine (NORVASC) 10 MG tablet Take 1 tablet (10 mg total) by mouth daily. 90 tablet 3  . atorvastatin (LIPITOR) 40 MG tablet Take 1 tablet (40 mg total) by mouth daily at 6 PM. 30 tablet 3  . buPROPion (WELLBUTRIN SR) 100 MG 12 hr tablet Take 1 tablet (100 mg total) by mouth 2 (two) times daily for 30 days. Take one tablet in AM and one around 2-3 PM 60 tablet 0  . cholecalciferol (VITAMIN D3) 25 MCG (1000 UT) tablet Take by mouth.    . gabapentin (NEURONTIN) 100 MG capsule Take 1 capsule (100 mg total) by mouth at bedtime. 90 capsule 0  . glucose blood (BAYER CONTOUR TEST) test strip Use as instructed 100 each 12  . losartan (COZAAR) 25 MG tablet Take 1 tablet (25 mg total) by mouth daily. 30 tablet 3  . metFORMIN (GLUCOPHAGE) 1000 MG tablet Take 1 tablet (1,000 mg total) by mouth 2 (two) times daily with a meal. 180 tablet 3  . Multiple Vitamin (MULTIVITAMIN) tablet Take by mouth.    . Omega-3 Fatty Acids (FISH OIL) 1000 MG CAPS Take by mouth.    . QC ENTERIC ASPIRIN 325 MG EC tablet   0  . raNITIdine HCl (ZANTAC PO)  Take by mouth.    . temazepam (RESTORIL) 30 MG capsule Take 1 capsule (30 mg total) by mouth at bedtime as needed for sleep. 30 capsule 1  . Turmeric 450 MG CAPS Take by mouth.    . Vitamin D, Ergocalciferol, (DRISDOL) 1.25 MG (50000 UT) CAPS capsule     . vortioxetine HBr (TRINTELLIX) 10 MG TABS tablet Take 1 tablet (10 mg total) by mouth  daily. 30 tablet 2   No current facility-administered medications for this visit.      Psychiatric Specialty Exam: Review of Systems  Gastrointestinal: Positive for heartburn.  Musculoskeletal: Positive for joint pain.  Psychiatric/Behavioral: The patient is nervous/anxious and has insomnia.   All other systems reviewed and are negative.   There were no vitals taken for this visit.There is no height or weight on file to calculate BMI.  General Appearance: NA  Eye Contact:  NA  Speech:  Clear and Coherent  Volume:  Normal  Mood:  Anxious  Affect:  NA  Thought Process:  Goal Directed  Orientation:  Full (Time, Place, and Person)  Thought Content: Logical   Suicidal Thoughts:  No  Homicidal Thoughts:  No  Memory:  Immediate;   Good Recent;   Good Remote;   Good  Judgement:  Intact  Insight:  Good  Psychomotor Activity:  NA  Concentration:  Concentration: Fair  Recall:  Good  Fund of Knowledge: Good  Language: Good  Akathisia:  NA  Handed:  Right  AIMS (if indicated): not done  Assets:  Communication Skills Desire for Improvement Financial Resources/Insurance Housing Talents/Skills Vocational/Educational  ADL's:  Intact  Cognition: WNL  Sleep:  Fair   Screenings: GAD-7     Office Visit from 10/04/2018 in [facility]  Total GAD-7 Score  3    Mini-Mental     [facility] Visit [facility]/20/2019 [facility]  Total Score (max 30 points )  29    PHQ2-9     Office Visit from 10/04/2018 in [facility] Office Visit from 08/06/2017 in [facility] Nutrition [facility]/31/2017 [facility]  PHQ-2 Total Score  2  0  0  PHQ-9 Total Score  8  -  -       Assessment and Plan: 58 yo married male with GAD and MDD who has not responded well to a new antidepressant duloxetine as he cannot tolerate adverse GI effect. Poor sleep on Restoril 15 mg but  improved on a higher dose of 30 mg. Continues to complain about  fatigue/poor concentration. He reports that his cognition was better when he was on Wellbutrin XL 300 mg (brand name), but did not on generic bupropion XL. He also failed a trial of venlafaxine in the past. He has had two CVAs which could have affected his cognition. He may also has an undiagnosed ADD but no hx of cognitive struggles growing up is known. We have discontinued duloxetine and instead started vortioxetine. He still reports nausea/hartburn in am for which he takes Zantac and Prevacid prn (OTC). Symptoms are however less pronounced than when he was taking duloxetine.  He is continuing in counseling with Hilbert Odor LCSW. He complains of hip pain, has elevated hemoglobin A1C (matformin dose was just doubled) and mildly elevated BP. 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  Plan: Continue Trintellix 10 mg daily and Restoril 30 mg at HS as needed for sleep. We will add bupropion SR 100 mg bid next for focusing/energy.Next visit in one month.   Magdalene Patricia, MD 03/17/2019, 8:46 AM

## 2019-03-20 ENCOUNTER — Ambulatory Visit: Payer: BLUE CROSS/BLUE SHIELD | Admitting: Podiatry

## 2019-03-21 ENCOUNTER — Other Ambulatory Visit: Payer: Self-pay

## 2019-03-21 ENCOUNTER — Ambulatory Visit: Payer: BLUE CROSS/BLUE SHIELD | Admitting: Podiatry

## 2019-03-21 ENCOUNTER — Encounter: Payer: Self-pay | Admitting: Internal Medicine

## 2019-03-21 ENCOUNTER — Encounter: Payer: Self-pay | Admitting: Podiatry

## 2019-03-21 VITALS — Temp 99.2°F

## 2019-03-21 DIAGNOSIS — B353 Tinea pedis: Secondary | ICD-10-CM | POA: Diagnosis not present

## 2019-03-21 DIAGNOSIS — E1149 Type 2 diabetes mellitus with other diabetic neurological complication: Secondary | ICD-10-CM

## 2019-03-21 MED ORDER — KETOCONAZOLE 2 % EX CREA: 1.0000 "application " | TOPICAL_CREAM | Freq: Every day | CUTANEOUS | 2 refills | Status: DC

## 2019-03-21 NOTE — Progress Notes (Signed)
Subjective: 58 year old male presents the office today for follow-up evaluation of neuropathy.  He is taken to the milligrams of gabapentin at nighttime is been very helpful he is also been using a B complex vitamin.  He has had one episode of cramping to his fourth toes but other than that has been doing much better.  He has also recently developed a rash between his fourth and fifth toes as well as a small blister on the top of his foot.  The skin has been peeling.  No drainage or pus.  The blisters did pop and dried. Denies any systemic complaints such as fevers, chills, nausea, vomiting. No acute changes since last appointment, and no other complaints at this time.   Objective: AAO x3, NAD There is dry, peeling, erythematous skin interdigitally between the fourth and fifth toes but there is no breakdown of the skin interdigitally.  The dorsal aspect of the second interspace are 2 small blisters which have drained and there is currently no fluid.  There is no drainage or presentation erythema, ascending cellulitis. Focal sensation intact with Semmes-Weinstein monofilament symptoms are improved after starting gabapentin. No open lesions or pre-ulcerative lesions.  No pain with calf compression, swelling, warmth, erythema  Assessment: 58 year old male with improving neuropathy symptoms, likely tinea pedis  Plan: -All treatment options discussed with the patient including all alternatives, risks, complications.  -This morning continue gabapentin 2 mg at nighttime.  Discussed with him possibly going up the dose if needed.  Continue B complex vitamin.  See him back in 6 months or sooner if needed.  I also prescribed ketoconazole for the tinea pedis.  Discussed importance of daily foot inspection. -Patient encouraged to call the office with any questions, concerns, change in symptoms.   Vivi Barrack DPM

## 2019-03-21 NOTE — Telephone Encounter (Signed)
Pt called and left message on [facility] mailbox 03/20/2019.  States he is checking on disability paperwork. Pt can be reached at [phone].

## 2019-03-21 NOTE — Patient Instructions (Signed)
[facility] on [address]

## 2019-03-22 NOTE — Telephone Encounter (Signed)
This has been completed and patient was informed by PCP via MyChart message.

## 2019-04-05 ENCOUNTER — Other Ambulatory Visit: Payer: Self-pay | Admitting: Podiatry

## 2019-04-17 ENCOUNTER — Other Ambulatory Visit: Payer: Self-pay

## 2019-04-17 ENCOUNTER — Ambulatory Visit (INDEPENDENT_AMBULATORY_CARE_PROVIDER_SITE_OTHER): Payer: BLUE CROSS/BLUE SHIELD | Admitting: Psychiatry

## 2019-04-17 DIAGNOSIS — F411 Generalized anxiety disorder: Secondary | ICD-10-CM | POA: Diagnosis not present

## 2019-04-17 DIAGNOSIS — F330 Major depressive disorder, recurrent, mild: Secondary | ICD-10-CM | POA: Diagnosis not present

## 2019-04-17 MED ORDER — BUPROPION HCL ER (SR) 150 MG PO TB12: 150.0000 mg | ORAL_TABLET | Freq: Two times a day (BID) | ORAL | 1 refills | Status: DC

## 2019-04-17 MED ORDER — TEMAZEPAM 30 MG PO CAPS: 30.0000 mg | ORAL_CAPSULE | Freq: Every evening | ORAL | 1 refills | Status: DC | PRN

## 2019-04-17 NOTE — Progress Notes (Signed)
BH MD/PA/NP OP Progress Note  04/17/2019 9:18 AM Curtis Lam  MRN:  [id number] Interview was conducted using WebEx teleconferencing application and I verified that I was speaking with the correct person using two identifiers. I discussed the limitations of evaluation and management by telemedicine and  the availability of in person appointments. Patient expressed understanding and agreed to proceed.  Chief Complaint: Improved but still low energy, focusing problems, some insomnia and anxiety  HPI: 58 yo married male with GADand MDDwho has not responded well to a new antidepressant duloxetine as he cannot tolerate adverse GI effect. Poor sleep on Restoril 15 mg but  improved on a higher dose of 30 mg. Continues to complain about  fatigue/poor concentration. He reports that his cognition was better when he was on Wellbutrin XL 300 mg (brand name), but did not on generic bupropion XL. He also failed a trial of venlafaxine in the past. He has had two CVAs which could have affected his cognition. He may also has an undiagnosed ADD but no hx of cognitive struggles growing up is known. We have discontinued duloxetine and instead started vortioxetine. He still reports nausea (less heartburn) in am for which he takes Zantac and Prevacid prn (OTC). Symptoms are however less pronounced than when he was taking duloxetine.  He is continuing in counseling with Curtis Odor LCSW. He complains of hip pain, has elevated hemoglobin A1C (matformin dose was just doubled) and mildly elevated BP. 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 We have added bupropion SR 100 mg bid for focusing/energy and Curtis Lam reports some improvement.  Visit Diagnosis:    ICD-10-CM   1. Generalized anxiety disorder F41.1   2. Mild episode of recurrent major depressive disorder (HCC) F33.0     Past Psychiatric History: Please see intake H&P.  Past Medical History:  Past Medical History:   Diagnosis Date  . Anxiety   . Depression   . Diabetes mellitus, type II (HCC)   . Elevated cholesterol   . History of migraine headaches    resolved s/p stopping ibuprofen  . Rectal polyp   . Schizoaffective disorder (HCC)   . 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 COLONOSCOPY  01/02/13  . TONSILLECTOMY    . VASECTOMY      Family Psychiatric History: Reviewed.  Family History:  Family History  Problem Relation Age of Onset  . Alzheimer's disease Father   . Depression Sister   . Diabetes Son 30  . Cancer Paternal Aunt        uterine, ovarian  . Diabetes Maternal Grandfather   . Heart disease Maternal Grandfather     Social History:  Social History   Socioeconomic History  . Marital status: Married    Spouse name: Not on file  . Number of children: 2  . Years of education: Not on file  . Highest education level: High school graduate  Occupational History  . Occupation: Engineer, mining)    Employer: [employer]  Social Needs  . Financial resource strain: Not hard at all  . Food insecurity:    Worry: Never true    Inability: Never true  . Transportation needs:    Medical: No    Non-medical: No  Tobacco Use  . Smoking status: Never Smoker  . Smokeless tobacco: Never Used  Substance and Sexual Activity  . Alcohol use:  Yes    Alcohol/week: 1.0 standard drinks    Types: 1 Cans of beer per week    Comment: 1 beer or wine 2-3 times per month, more in summer than winter  . Drug use: No  . Sexual activity: Yes    Partners: Female  Lifestyle  . Physical activity:    Days per week: 0 days    Minutes per session: 0 min  . Stress: Very much  Relationships  . Social connections:    Talks on phone: Not on file    Gets together: Not on file    Attends religious service: Never    Active member of club or organization: Not on file    Attends meetings of clubs or organizations: Not on file     Relationship status: Married  Other Topics Concern  . Not on file  Social History Narrative   Lives with wife and son.  Other son lives in [place].  Wife suffers from depression, recent flare and hospitalization (12/2013); He lost his job 09/2014 for 4-5 weeks, and got new job    Allergies: No Known Allergies  Metabolic Disorder Labs: Lab Results  Component Value Date   HGBA1C 9.0 (A) 03/08/2019   MPG 126 (H) 01/23/2015   MPG 120 (H) 12/25/2013   No results found for: PROLACTIN Lab Results  Component Value Date   CHOL 193 10/04/2018   TRIG 211.0 (H) 10/04/2018   HDL 43.00 10/04/2018   CHOLHDL 4 10/04/2018   VLDL 42.2 (H) 10/04/2018   LDLCALC 110 (H) 08/11/2017   LDLCALC 84 08/13/2016   Lab Results  Component Value Date   TSH 2.589 01/23/2015   TSH 2.765 12/25/2013    Therapeutic Level Labs: No results found for: LITHIUM No results found for: VALPROATE No components found for:  CBMZ  Current Medications: Current Outpatient Medications  Medication Sig Dispense Refill  . amLODipine (NORVASC) 10 MG tablet Take 1 tablet (10 mg total) by mouth daily. 90 tablet 3  . atorvastatin (LIPITOR) 40 MG tablet Take 1 tablet (40 mg total) by mouth daily at 6 PM. 30 tablet 3  . buPROPion (WELLBUTRIN SR) 100 MG 12 hr tablet Take 1 tablet (100 mg total) by mouth 2 (two) times daily for 30 days. Take one tablet in AM and one around 2-3 PM 60 tablet 0  . cholecalciferol (VITAMIN D3) 25 MCG (1000 UT) tablet Take by mouth.    . gabapentin (NEURONTIN) 100 MG capsule TAKE 1 CAPSULE BY MOUTH AT BEDTIME. 90 capsule 0  . glucose blood (BAYER CONTOUR TEST) test strip Use as instructed 100 each 12  . ketoconazole (NIZORAL) 2 % cream Apply 1 application topically daily. 60 g 2  . losartan (COZAAR) 25 MG tablet Take 1 tablet (25 mg total) by mouth daily. 30 tablet 3  . metFORMIN (GLUCOPHAGE) 1000 MG tablet Take 1 tablet (1,000 mg total) by mouth 2 (two) times daily with a meal. 180 tablet 3  .  Multiple Vitamin (MULTIVITAMIN) tablet Take by mouth.    . Omega-3 Fatty Acids (FISH OIL) 1000 MG CAPS Take by mouth.    . QC ENTERIC ASPIRIN 325 MG EC tablet   0  . raNITIdine HCl (ZANTAC PO) Take by mouth.    . temazepam (RESTORIL) 30 MG capsule Take 1 capsule (30 mg total) by mouth at bedtime as needed for sleep. 30 capsule 1  . Turmeric 450 MG CAPS Take by mouth.    . Vitamin D, Ergocalciferol, (DRISDOL) 1.25  MG (50000 UT) CAPS capsule     . vortioxetine HBr (TRINTELLIX) 10 MG TABS tablet Take 1 tablet (10 mg total) by mouth daily. 30 tablet 2   No current facility-administered medications for this visit.      Musculoskeletal: Strength & Muscle Tone: within normal limits Gait & Station: normal Patient leans: N/A  Psychiatric Specialty Exam: Review of Systems  Gastrointestinal: Positive for nausea.  Neurological: Positive for sensory change.  Psychiatric/Behavioral: The patient is nervous/anxious.   All other systems reviewed and are negative.   There were no vitals taken for this visit.There is no height or weight on file to calculate BMI.  General Appearance: NA and Casual  Eye Contact:  Good  Speech:  Clear and Coherent  Volume:  Normal  Mood:  Anxious  Affect:  Constricted  Thought Process:  Goal Directed  Orientation:  Full (Time, Place, and Person)  Thought Content: Logical   Suicidal Thoughts:  No  Homicidal Thoughts:  No  Memory:  Immediate;   Fair Recent;   Good Remote;   Good  Judgement:  Good  Insight:  Good  Psychomotor Activity:  Normal  Concentration:  Concentration: Fair  Recall:  Fair  Fund of Knowledge: Good  Language: Good  Akathisia:  Negative  Handed:  Right  AIMS (if indicated): not done  Assets:  Communication Skills Desire for Improvement Financial Resources/Insurance Housing Social Support  ADL's:  Intact  Cognition: WNL  Sleep:  Fair   Screenings: GAD-7     [facility]   Total GAD-7 Score  3    Mini-Mental     [facility]/2019 [facility]  Total Score (max 30 points )  29    PHQ2-9     [facility]/31/2017 [facility]  PHQ-2 Total Score  2  0  0  PHQ-9 Total Score  8  -  -       Assessment and Plan: 58 yo married male with GADand MDDwho has not responded well to a new antidepressant duloxetine as he cannot tolerate adverse GI effect. Poor sleep on Restoril 15 mg but  improved on a higher dose of 30 mg. Continues to complain about fatigue/poor concentration (but less pronounced since Wellbutrin was added). He reports that his cognition was better when he was on Wellbutrin XL 300 mg (brand name), but did not on generic bupropion XL. He also failed a trial of venlafaxine in the past. He has had two CVAs which could have affected his cognition. He may also has an undiagnosed ADD but no hx of cognitive struggles growing up is known. We have discontinued duloxetine and instead started vortioxetine. He still reports nausea (less heartburn) in am for which he takes Zantac and Prevacid prn (OTC). Symptoms are however less pronounced than when he was taking duloxetine.  He is continuing in counseling with Curtis OdorBethany Morris LCSW. He complains of hip pain, has elevated hemoglobin A1C (matformin dose was just doubled) and mildly elevated BP. 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 We have added bupropion SR 100 mg bid for focusing/energy and Curtis NeedleMichael reports some improvement.  Plan: Increase Wellbutrin SR to 150 mg bid, continue Trintellix 10 mg and Restoril 30 mg at HS prn insomnia.  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    Magdalene Patricia, MD 04/17/2019, 9:18 AM

## 2019-04-18 ENCOUNTER — Ambulatory Visit (INDEPENDENT_AMBULATORY_CARE_PROVIDER_SITE_OTHER): Payer: BLUE CROSS/BLUE SHIELD | Admitting: Psychiatry

## 2019-04-18 ENCOUNTER — Encounter (HOSPITAL_COMMUNITY): Payer: Self-pay | Admitting: Psychiatry

## 2019-04-18 ENCOUNTER — Other Ambulatory Visit: Payer: Self-pay

## 2019-04-18 DIAGNOSIS — F411 Generalized anxiety disorder: Secondary | ICD-10-CM | POA: Diagnosis not present

## 2019-04-18 DIAGNOSIS — F330 Major depressive disorder, recurrent, mild: Secondary | ICD-10-CM | POA: Diagnosis not present

## 2019-04-18 DIAGNOSIS — R413 Other amnesia: Secondary | ICD-10-CM

## 2019-04-18 NOTE — Progress Notes (Signed)
Virtual Visit via Video Note  I connected with Curtis Lam on 04/18/19 at  1:30 PM EDT by a video enabled telemedicine application and verified that I am speaking with the correct person using two identifiers.   I discussed the limitations of evaluation and management by telemedicine and the availability of in person appointments. The patient expressed understanding and agreed to proceed.  History of Present Illness: GAD, MDD and Memory difficulties due to recent stroke and current life stressors related to work performance.    Observations/Objective: Counselor met with Curtis Lam for individual therapy via Webex. Counselor assessed mental health symptoms and gathered information on current issues. Curtis Lam reports continual memory problems, nausea from medications, improved sleep, passive suicidal thoughts, increased bizarre dreams and low energy/motivation. Counselor explored strategies and ways to improve his problematic symptoms. Counselor prompted Curtis Lam to identify ways he can address memory issues and take control of his employment issues. Counselor discussed medication concerns and encouraged him to follow up with his medical staff to address if issues persist. Counselor provided psychoeducation of safety and reminded him of resources to access when he is feeling suicidal. [name] continues to share that his wife is his motivator for staying alive.   Assessment and Plan: Counselor will continue to meet with Curtis Lam on a monthly basis to address treatment plan goals. [name] will continue to follow doctors orders, his safety plan and utilize healthy coping strategies.   Follow Up Instructions: Counselor will send Webex link for next meeting and schedule him in the system.    I discussed the assessment and treatment plan with the patient. The patient was provided an opportunity to ask questions and all were answered. The patient agreed with the plan and demonstrated an understanding of the  instructions.   The patient was advised to call back or seek an in-person evaluation if the symptoms worsen or if the condition fails to improve as anticipated.  I provided 60 minutes of non-face-to-face time during this encounter.   [name], LCSW

## 2019-05-09 ENCOUNTER — Other Ambulatory Visit: Payer: Self-pay | Admitting: Family

## 2019-05-09 ENCOUNTER — Ambulatory Visit (INDEPENDENT_AMBULATORY_CARE_PROVIDER_SITE_OTHER): Payer: BLUE CROSS/BLUE SHIELD | Admitting: Psychiatry

## 2019-05-09 ENCOUNTER — Other Ambulatory Visit: Payer: Self-pay

## 2019-05-09 ENCOUNTER — Encounter (HOSPITAL_COMMUNITY): Payer: Self-pay | Admitting: Psychiatry

## 2019-05-09 DIAGNOSIS — F411 Generalized anxiety disorder: Secondary | ICD-10-CM

## 2019-05-09 DIAGNOSIS — F330 Major depressive disorder, recurrent, mild: Secondary | ICD-10-CM | POA: Diagnosis not present

## 2019-05-09 DIAGNOSIS — R413 Other amnesia: Secondary | ICD-10-CM | POA: Diagnosis not present

## 2019-05-09 NOTE — Progress Notes (Signed)
Virtual Visit via Video Note  I connected with [name] on 05/09/19 at  3:30 PM EDT by a video enabled telemedicine application and verified that I am speaking with the correct person using two identifiers.  Location: Patient: Curtis Lam Provider: [name], LCSW   I discussed the limitations of evaluation and management by telemedicine and the availability of in person appointments. The patient expressed understanding and agreed to proceed.  History of Present Illness: GAD, MDD and memory difficulties due to complex medical conditions, work-related problems and medication challenges.    Observations/Objective: Counselor met with [name] for individual therapy via Webex. Counselor assessed MH symptoms and progress on treatment plan goals. Quirino denied suicidal ideation or self-harm behaviors. Deronte shared that his main concerns continue to be his sleep and memory issues. Counselor and Eusebio explored a variety of ways to address these issues differently. Counselor reviewed resources sent to [facility] to understand memory loss and improve memory function. Counselor notes that Petra struggles with memory functioning in conversation, making long pauses to recall recent information like names of cereal, common words, or daily activities. This is a sharp decline over first session in Feb 2020. Counselor recommends that he have a sleep study and that he be reassessed for early onset dementia. Counselor recommends that he be approved for long-term disability due to memory challenges and concerns for safety with driving long distances and working long hours. Counselor encouraged Daiquan to follow up with Psyc nurse regarding medication side effects.   Assessment and Plan: Counselor will continue to meet with [name] to address treatment plan goals. Fredrik will continue to follow recommendations of providers and implement skills learned in session.  Follow Up Instructions: Counselor  will send information for next session via Webex.   I discussed the assessment and treatment plan with the patient. The patient was provided an opportunity to ask questions and all were answered. The patient agreed with the plan and demonstrated an understanding of the instructions.   The patient was advised to call back or seek an in-person evaluation if the symptoms worsen or if the condition fails to improve as anticipated.  I provided 53 minutes of non-face-to-face time during this encounter.   [name], LCSW

## 2019-05-10 ENCOUNTER — Telehealth (HOSPITAL_COMMUNITY): Payer: Self-pay

## 2019-05-10 NOTE — Telephone Encounter (Signed)
This is a patient of Dr. Hinton Dyer. Patient called regarding his Trintellix 10mg . He stated he takes it in the morning and he has stomach issues and it makes him sick for a few hours after taking it. He stated he is looking for another option. Please review and advise. Thank you.

## 2019-05-10 NOTE — Telephone Encounter (Signed)
Most of the psychotropic medication does cause GI side effects.  In the past we have tried venlafaxine with poor outcome.  If he wants to go back on Cymbalta low-dose 20 to 30 mg we can try.  He had taken in the past but not sure why it was discontinued.

## 2019-05-16 NOTE — Telephone Encounter (Signed)
Returned patient's call regarding his medication. There was no answer so I left voicemail to ask patient to call us back.

## 2019-05-17 NOTE — Telephone Encounter (Signed)
Patient called back stating that he has figured out a way of circumventing his stomach issues. He stated he is taking his medication in the morning and eating an hour after he takes it. He stated that the way he is managing it seems to be working for now and that he thinks he will be ok until he speaks with the doctor at his upcoming appointment on 05/31/19.

## 2019-05-30 ENCOUNTER — Other Ambulatory Visit: Payer: Self-pay

## 2019-05-30 ENCOUNTER — Ambulatory Visit (INDEPENDENT_AMBULATORY_CARE_PROVIDER_SITE_OTHER): Payer: BC Managed Care – PPO | Admitting: Psychiatry

## 2019-05-30 DIAGNOSIS — F330 Major depressive disorder, recurrent, mild: Secondary | ICD-10-CM

## 2019-05-30 DIAGNOSIS — F411 Generalized anxiety disorder: Secondary | ICD-10-CM

## 2019-05-30 DIAGNOSIS — R413 Other amnesia: Secondary | ICD-10-CM

## 2019-05-31 ENCOUNTER — Encounter (HOSPITAL_COMMUNITY): Payer: Self-pay | Admitting: Psychiatry

## 2019-05-31 ENCOUNTER — Ambulatory Visit (INDEPENDENT_AMBULATORY_CARE_PROVIDER_SITE_OTHER): Payer: BC Managed Care – PPO | Admitting: Psychiatry

## 2019-05-31 ENCOUNTER — Ambulatory Visit: Payer: Self-pay | Admitting: Internal Medicine

## 2019-05-31 ENCOUNTER — Other Ambulatory Visit: Payer: Self-pay

## 2019-05-31 DIAGNOSIS — F331 Major depressive disorder, recurrent, moderate: Secondary | ICD-10-CM

## 2019-05-31 DIAGNOSIS — F411 Generalized anxiety disorder: Secondary | ICD-10-CM

## 2019-05-31 DIAGNOSIS — R41840 Attention and concentration deficit: Secondary | ICD-10-CM

## 2019-05-31 DIAGNOSIS — F330 Major depressive disorder, recurrent, mild: Secondary | ICD-10-CM

## 2019-05-31 MED ORDER — SUVOREXANT 15 MG PO TABS: 15.0000 mg | ORAL_TABLET | Freq: Every evening | ORAL | 0 refills | Status: AC | PRN

## 2019-05-31 MED ORDER — BUPROPION HCL ER (SR) 200 MG PO TB12: 200.0000 mg | ORAL_TABLET | Freq: Two times a day (BID) | ORAL | 1 refills | Status: DC

## 2019-05-31 MED ORDER — VORTIOXETINE HBR 5 MG PO TABS: 5.0000 mg | ORAL_TABLET | Freq: Every day | ORAL | 0 refills | Status: AC

## 2019-05-31 NOTE — Progress Notes (Addendum)
BH MD/PA/NP OP Progress Note  05/31/2019 9:18 AM Elray BubaMichael A Ciszek  MRN:  [id number] Interview was conducted by phone and I verified that I was speaking with the correct person using two identifiers. I discussed the limitations of evaluation and management by telemedicine and  the availability of in person appointments. Patient expressed understanding and agreed to proceed.  Chief Complaint: Fatigue, problems with focusing/memory, depression, anxiety, insomnia.  HPI: 58 yo married male with GADand MDDwho has not responded well to a new antidepressant duloxetine as he cannot tolerate adverse GI effect. Poor sleep on Restoril 30 mg (earlier tried diazepam also without much benefit). Continues to complain aboutfatigue/poor concentration (but less pronounced since Wellbutrin was added). He reports that his cognition was better when he was on Wellbutrin XL 300 mg (brand name), but did not on generic bupropion XL. He also failed a trial of venlafaxine in the past. He has had two CVAs which could have affected his cognition. He may also has an undiagnosed ADD but no hx of cognitive struggles growing up is known.We have discontinuedduloxetine and instead startedvortioxetine (10 mg). He still reports nausea (less heartburn) in am for which he takes Zantac and Prevacid prn (OTC). He is continuing in counseling with Hilbert OdorBethany Morris LCSW. 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 We have added bupropion SR 150 mg bid for focusing/energy and Casimiro NeedleMichael reports some improvement. Casimiro NeedleMichael continues to present with clear recent memory impairment.  Visit Diagnosis:    ICD-10-CM   1. Moderate episode of recurrent major depressive disorder (HCC) F33.1   2. Generalized anxiety disorder F41.1   3. Concentration deficit R41.840   4. Mild episode of recurrent major depressive disorder (HCC) F33.0     Past Psychiatric History: Please see intake H&P.  Past Medical History:  Past  Medical History:  Diagnosis Date  . Anxiety   . Depression   . Diabetes mellitus, type II (HCC)   . Elevated cholesterol   . History of migraine headaches    resolved s/p stopping ibuprofen  . Rectal polyp   . Schizoaffective disorder (HCC)   . 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 COLONOSCOPY  01/02/13  . TONSILLECTOMY    . VASECTOMY      Family Psychiatric History: Reviewed.  Family History:  Family History  Problem Relation Age of Onset  . Alzheimer's disease Father   . Depression Sister   . Diabetes Son 30  . Cancer Paternal Aunt        uterine, ovarian  . Diabetes Maternal Grandfather   . Heart disease Maternal Grandfather     Social History:  Social History   Socioeconomic History  . Marital status: Married    Spouse name: Not on file  . Number of children: 2  . Years of education: Not on file  . Highest education level: High school graduate  Occupational History  . 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 Financial resource strain: Not hard at all  . Food insecurity:    Worry: Never true    Inability: Never true  . Transportation needs:    Medical: No    Non-medical: No  Tobacco Use  . Smoking status: Never Smoker  . Smokeless tobacco: Never Used  Substance and Sexual Activity  . Alcohol use: Yes    Alcohol/week: 1.0 standard drinks  Types: 1 Cans of beer per week    Comment: 1 beer or wine 2-3 times per month, more in summer than winter  . Drug use: No  . Sexual activity: Yes    Partners: Female  Lifestyle  . Physical activity:    Days per week: 0 days    Minutes per session: 0 min  . Stress: Very much  Relationships  . Social connections:    Talks on phone: Not on file    Gets together: Not on file    Attends religious service: Never    Active member of club or organization: Not on file    Attends meetings of clubs or  organizations: Not on file    Relationship status: Married  Other Topics Concern  . Not on file  Social History Narrative   Lives with wife and son.  Other son lives in [place].  Wife suffers from depression, recent flare and hospitalization (12/2013); He lost his job 09/2014 for 4-5 weeks, and got new job    Allergies: No Known Allergies  Metabolic Disorder Labs: Lab Results  Component Value Date   HGBA1C 9.0 (A) 03/08/2019   MPG 126 (H) 01/23/2015   MPG 120 (H) 12/25/2013   No results found for: PROLACTIN Lab Results  Component Value Date   CHOL 193 10/04/2018   TRIG 211.0 (H) 10/04/2018   HDL 43.00 10/04/2018   CHOLHDL 4 10/04/2018   VLDL 42.2 (H) 10/04/2018   LDLCALC 110 (H) 08/11/2017   LDLCALC 84 08/13/2016   Lab Results  Component Value Date   TSH 2.589 01/23/2015   TSH 2.765 12/25/2013    Therapeutic Level Labs: No results found for: LITHIUM No results found for: VALPROATE No components found for:  CBMZ  Current Medications: Current Outpatient Medications  Medication Sig Dispense Refill  . amLODipine (NORVASC) 10 MG tablet Take 1 tablet (10 mg total) by mouth daily. 90 tablet 3  . atorvastatin (LIPITOR) 40 MG tablet TAKE 1 TABLET BY MOUTH DAILY AT 6 PM. 90 tablet 1  . buPROPion (WELLBUTRIN SR) 150 MG 12 hr tablet Take 1 tablet (150 mg total) by mouth 2 (two) times daily. Take one tablet in AM and one around 2-3 PM 60 tablet 1  . cholecalciferol (VITAMIN D3) 25 MCG (1000 UT) tablet Take by mouth.    . gabapentin (NEURONTIN) 100 MG capsule TAKE 1 CAPSULE BY MOUTH AT BEDTIME. 90 capsule 0  . glucose blood (BAYER CONTOUR TEST) test strip Use as instructed 100 each 12  . ketoconazole (NIZORAL) 2 % cream Apply 1 application topically daily. 60 g 2  . losartan (COZAAR) 25 MG tablet Take 1 tablet (25 mg total) by mouth daily. 30 tablet 3  . metFORMIN (GLUCOPHAGE) 1000 MG tablet Take 1 tablet (1,000 mg total) by mouth 2 (two) times daily with a meal. 180 tablet 3  .  Multiple Vitamin (MULTIVITAMIN) tablet Take by mouth.    . Omega-3 Fatty Acids (FISH OIL) 1000 MG CAPS Take by mouth.    . QC ENTERIC ASPIRIN 325 MG EC tablet   0  . raNITIdine HCl (ZANTAC PO) Take by mouth.    . Turmeric 450 MG CAPS Take by mouth.    . Vitamin D, Ergocalciferol, (DRISDOL) 1.25 MG (50000 UT) CAPS capsule     . vortioxetine HBr (TRINTELLIX) 10 MG TABS tablet Take 1 tablet (10 mg total) by mouth daily. 30 tablet 2   No current facility-administered medications for this visit.  Psychiatric Specialty Exam: Review of Systems  Constitutional: Positive for malaise/fatigue.  HENT: Positive for congestion.   Gastrointestinal: Positive for nausea.  Psychiatric/Behavioral: Positive for depression and memory loss. The patient is nervous/anxious and has insomnia.     There were no vitals taken for this visit.There is no height or weight on file to calculate BMI.  General Appearance: NA  Eye Contact:  NA  Speech:  Clear and Coherent  Volume:  Normal  Mood:  Anxious and Depressed  Affect:  NA  Thought Process:  Goal Directed  Orientation:  Full (Time, Place, and Person)  Thought Content: Logical   Suicidal Thoughts:  No  Homicidal Thoughts:  No  Memory:  Immediate;   Fair Recent;   Poor Remote;   Fair  Judgement:  Good  Insight:  Fair  Psychomotor Activity:  NA  Concentration:  Concentration: Fair  Recall:  FiservFair  Fund of Knowledge: Fair  Language: Good  Akathisia:  Negative  Handed:  Right  AIMS (if indicated): not done  Assets:  Communication Skills Desire for Improvement Financial Resources/Insurance Housing Resilience  ADL's:  Intact  Cognition: WNL  Sleep:  Poor   Screenings: GAD-7     [facility]  Total GAD-7 Score  3    Mini-Mental     [facility]  Total Score (max 30 points )  29    PHQ2-9     [facility]/2017 [facility]  PHQ-2 Total Score  2  0  0  PHQ-9 Total Score  8  -  -       Assessment and Plan: 58 yo married male with GADand MDDwho has not responded well to a new antidepressant duloxetine as he cannot tolerate adverse GI effect. Poor sleep on Restoril 30 mg (earlier tried diazepam also without much benefit). Continues to complain aboutfatigue/poor concentration (but less pronounced since Wellbutrin was added). He reports that his cognition was better when he was on Wellbutrin XL 300 mg (brand name), but did not on generic bupropion XL. He also failed a trial of venlafaxine in the past. He has had two CVAs which could have affected his cognition. He may also has an undiagnosed ADD but no hx of cognitive struggles growing up is known.We have discontinuedduloxetine and instead startedvortioxetine (10 mg). He still reports nausea (less heartburn) in am for which he takes Zantac and Prevacid prn (OTC). He is continuing in counseling with Hilbert OdorBethany Morris LCSW. 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 We have added bupropion SR 150 mg bid for focusing/energy and Casimiro NeedleMichael reports some improvement. His memory remains poor (recent in particular). It likely reflects CVA related decline as it started rather abruptly , per his report, in December last year.             Plan: Increase Wellbutrin SR to 200 mg bid, continue Trintellix but at a lower 5 mg dose in hope of further decrease in nausea. Discontinue temazepam and instead try Belsomra 15 mg for insomnia. He reports snoring at night and would benefit from a sleep study to assess for possible sleep apnea. 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    Magdalene Patricialgierd A Haydn Cush, MD 05/31/2019,  9:18 AM

## 2019-05-31 NOTE — Progress Notes (Signed)
Virtual Visit via Video Note  I connected with Curtis Lam on 05/31/19 at  3:00 PM EDT by a video enabled telemedicine application and verified that I am speaking with the correct person using two identifiers.  Location: Patient: Curtis Lam Provider: [name], LCSW   I discussed the limitations of evaluation and management by telemedicine and the availability of in person appointments. The patient expressed understanding and agreed to proceed.  History of Present Illness: Generalized Anxiety Disorder, MDD and memory difficulties due to 2 strokes in the past year and work/income related issues.    Observations/Objective: Counselor met with Curtis Lam for individual therapy via [employer]. Counselor assessed MH symptoms and progress on treatment plan goals. Curtis Lam denied suicidal ideation or self-harm behaviors. Curtis Lam shared that he was experiencing extreme nausea due to a psych med. He reported that he feels like he "doesn't care anymore" or have any feelings other than "blah". His depression and anxiety has not improved on the medications. He reported that his memory issues have become debilitating as well, stating that he forgets to take his medications on time or at all, he will walk to the trash can to take it to the road and forget why he is there, he will forget to eat or know why he is in front of the pantry or fridge, he does not trust himself to drive, reports having lack of energy or motivation to keep up with his garden or household tasks. Counselor explored these issues and gathered his wife's contact information to be able to communicate with her and invite her to future sessions, so she can share concerns and help with following through on recommendations. Counselor and Curtis Lam discussed his upcoming dr appointments and how to share his symptoms with them. Counselor attempted to process information sent about memory improvement, but he was too defeated and sick to his stomach to  fully engage. Counselor discussed following up with his drs to share concerns and getting connected with his wife for future involvement. Counselor encouraged him to utilize healthy coping skills as he remembers to prevent further decline.    Assessment and Plan: Counselor will continue to meet with Curtis Lam to address treatment plan goals. Curtis Lam will continue to follow recommendations of providers and implement skills learned in session.  Follow Up Instructions: Counselor will send information for next session via Webex.     I discussed the assessment and treatment plan with the patient. The patient was provided an opportunity to ask questions and all were answered. The patient agreed with the plan and demonstrated an understanding of the instructions.   The patient was advised to call back or seek an in-person evaluation if the symptoms worsen or if the condition fails to improve as anticipated.  I provided 55 minutes of non-face-to-face time during this encounter.   [name], LCSW

## 2019-06-02 ENCOUNTER — Encounter: Payer: Self-pay | Admitting: Internal Medicine

## 2019-06-02 ENCOUNTER — Other Ambulatory Visit (INDEPENDENT_AMBULATORY_CARE_PROVIDER_SITE_OTHER): Payer: BC Managed Care – PPO

## 2019-06-02 ENCOUNTER — Other Ambulatory Visit: Payer: Self-pay

## 2019-06-02 ENCOUNTER — Ambulatory Visit (INDEPENDENT_AMBULATORY_CARE_PROVIDER_SITE_OTHER): Payer: BC Managed Care – PPO | Admitting: Internal Medicine

## 2019-06-02 VITALS — BP 142/90 | HR 87 | Temp 97.9°F | Ht 67.0 in | Wt 195.0 lb

## 2019-06-02 DIAGNOSIS — E1140 Type 2 diabetes mellitus with diabetic neuropathy, unspecified: Secondary | ICD-10-CM

## 2019-06-02 DIAGNOSIS — R41840 Attention and concentration deficit: Secondary | ICD-10-CM

## 2019-06-02 DIAGNOSIS — I10 Essential (primary) hypertension: Secondary | ICD-10-CM

## 2019-06-02 LAB — COMPREHENSIVE METABOLIC PANEL
ALT: 51 U/L (ref 0–53)
AST: 32 U/L (ref 0–37)
Albumin: 4.6 g/dL (ref 3.5–5.2)
Alkaline Phosphatase: 54 U/L (ref 39–117)
BUN: 17 mg/dL (ref 6–23)
CO2: 26 mEq/L (ref 19–32)
Calcium: 9.1 mg/dL (ref 8.4–10.5)
Chloride: 105 mEq/L (ref 96–112)
Creatinine, Ser: 0.77 mg/dL (ref 0.40–1.50)
GFR: 102.95 mL/min (ref >60.00)
Glucose, Bld: 143 mg/dL — ABNORMAL HIGH (ref 70–99)
Potassium: 4.2 mEq/L (ref 3.5–5.1)
Sodium: 140 mEq/L (ref 135–145)
Total Bilirubin: 0.4 mg/dL (ref 0.2–1.2)
Total Protein: 7.2 g/dL (ref 6.0–8.3)

## 2019-06-02 LAB — LIPID PANEL
Cholesterol: 97 mg/dL (ref 0–200)
HDL: 39.7 mg/dL (ref >39.00)
LDL Cholesterol: 32 mg/dL (ref 0–99)
NonHDL: 57.43
Total CHOL/HDL Ratio: 2
Triglycerides: 128 mg/dL (ref 0.0–149.0)
VLDL: 25.6 mg/dL (ref 0.0–40.0)

## 2019-06-02 LAB — VITAMIN D 25 HYDROXY (VIT D DEFICIENCY, FRACTURES): VITD: 59.4 ng/mL (ref 30.00–100.00)

## 2019-06-02 LAB — HEMOGLOBIN A1C: Hgb A1c MFr Bld: 8.1 % — ABNORMAL HIGH (ref 4.6–6.5)

## 2019-06-02 MED ORDER — LOSARTAN POTASSIUM 25 MG PO TABS: 25.0000 mg | ORAL_TABLET | Freq: Every day | ORAL | 3 refills | Status: DC

## 2019-06-02 NOTE — Assessment & Plan Note (Signed)
Needs HgA1c and adjust as needed. Did increase metformin to 1000 mg BID but it is unclear if he made this change or not.

## 2019-06-02 NOTE — Assessment & Plan Note (Signed)
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 Talked to him about neuro rehab and he defers until waiting for neurology visit. Talked to him about various things he can do at home with wife to help with cognition. Needs to continue with psych to control underlying mental health problems as well which can be impacting cognition.

## 2019-06-02 NOTE — Patient Instructions (Signed)
We will check the labs today. 

## 2019-06-02 NOTE — Progress Notes (Signed)
   Subjective:   Patient ID: Curtis Lam, male    DOB: 1961/04/20, 58 y.o.   MRN: [id number]  HPI The patient is a 58 YO man coming in for follow up of his diabetes (taking metformin 1000 mg BID since last visit, he is trying to remember but concentration is a big barrier, still having some higher than average sugar readings, denies new numbness) and blood pressure (taking amlodipine and losartan, denies side effects, denies headaches or chest pain) and concentration problems (post stroke, still struggling with severe mental health problems, is seeing psych and they have struggled with controlling his symptoms, they are concerned that he may have some deficits caused by prior stroke, not doing any concentration exercises or cognitive exercises at home, denies worsening but minimal if any improvement).   Review of Systems  Constitutional: Negative.   HENT: Negative.   Eyes: Negative.   Respiratory: Negative for cough, chest tightness and shortness of breath.   Cardiovascular: Negative for chest pain, palpitations and leg swelling.  Gastrointestinal: Negative for abdominal distention, abdominal pain, constipation, diarrhea, nausea and vomiting.  Musculoskeletal: Negative.   Skin: Negative.   Neurological: Negative.   Psychiatric/Behavioral: Positive for decreased concentration, dysphoric mood and sleep disturbance.    Objective:  Physical Exam Constitutional:      Appearance: He is well-developed. He is obese.  HENT:     Head: Normocephalic and atraumatic.  Neck:     Musculoskeletal: Normal range of motion.  Cardiovascular:     Rate and Rhythm: Normal rate and regular rhythm.  Pulmonary:     Effort: Pulmonary effort is normal. No respiratory distress.     Breath sounds: Normal breath sounds. No wheezing or rales.  Abdominal:     General: Bowel sounds are normal. There is no distension.     Palpations: Abdomen is soft.     Tenderness: There is no abdominal tenderness. There is no  rebound.  Skin:    General: Skin is warm and dry.  Neurological:     Mental Status: He is alert and oriented to person, place, and time.     Coordination: Coordination normal.  Psychiatric:        Behavior: Behavior normal.     Vitals:   06/02/19 1057  BP: (!) 142/90  Pulse: 87  Temp: 97.9 F (36.6 C)  TempSrc: Oral  SpO2: 96%  Weight: 195 lb (88.5 kg)  Height: 5\' 7"  (1.702 m)    Assessment & Plan:

## 2019-06-02 NOTE — Assessment & Plan Note (Signed)
BP at gaol on amlodipine and losartan. BP mildly elevated at this visit but better at home and on recheck. Checking CMP and adjust as needed.

## 2019-06-07 DIAGNOSIS — R413 Other amnesia: Secondary | ICD-10-CM | POA: Diagnosis not present

## 2019-06-07 DIAGNOSIS — Z8673 Personal history of transient ischemic attack (TIA), and cerebral infarction without residual deficits: Secondary | ICD-10-CM | POA: Diagnosis not present

## 2019-06-07 DIAGNOSIS — I10 Essential (primary) hypertension: Secondary | ICD-10-CM | POA: Diagnosis not present

## 2019-06-07 DIAGNOSIS — E1140 Type 2 diabetes mellitus with diabetic neuropathy, unspecified: Secondary | ICD-10-CM | POA: Diagnosis not present

## 2019-06-09 ENCOUNTER — Telehealth: Payer: Self-pay

## 2019-06-09 NOTE — Telephone Encounter (Signed)
Noted  

## 2019-06-09 NOTE — Telephone Encounter (Signed)
Copied from [place] [phone]. Topic: General - Other >> Jun 09, 2019  1:42 PM Leward Quan A wrote: Reason for CRM: Dr [facility] called to inform Dr Sharlet Salina that during her investigation into patients appointments she noticed a deterioration in his visits to his Psychiatrist and she is not sure what caused this gap. She is thinking that he may have had another stroke. She wanted to pass this information on if there are any questions or concerns. Please call Ph# [phone]

## 2019-06-20 ENCOUNTER — Other Ambulatory Visit: Payer: Self-pay

## 2019-06-20 ENCOUNTER — Ambulatory Visit (INDEPENDENT_AMBULATORY_CARE_PROVIDER_SITE_OTHER): Payer: BC Managed Care – PPO | Admitting: Psychiatry

## 2019-06-20 DIAGNOSIS — F411 Generalized anxiety disorder: Secondary | ICD-10-CM | POA: Diagnosis not present

## 2019-06-20 DIAGNOSIS — F331 Major depressive disorder, recurrent, moderate: Secondary | ICD-10-CM | POA: Diagnosis not present

## 2019-06-20 DIAGNOSIS — R413 Other amnesia: Secondary | ICD-10-CM | POA: Diagnosis not present

## 2019-06-21 ENCOUNTER — Encounter (HOSPITAL_COMMUNITY): Payer: Self-pay | Admitting: Psychiatry

## 2019-06-21 DIAGNOSIS — R413 Other amnesia: Secondary | ICD-10-CM | POA: Diagnosis not present

## 2019-06-21 NOTE — Progress Notes (Signed)
Virtual Visit via Video Note  I connected with Curtis Lam on 06/21/19 at  3:00 PM EDT by a video enabled telemedicine application and verified that I am speaking with the correct person using two identifiers.  Location: Patient: Curtis Lam Provider: [name], LCSW   I discussed the limitations of evaluation and management by telemedicine and the availability of in person appointments. The patient expressed understanding and agreed to proceed.  History of Present Illness: MDD, Memory issues and GAD.   Observations/Objective: Counselor met with Curtis Lam and his wife Curtis Lam for Family therapy via [employer]. Counselor assessed MH symptoms and progress on treatment plan goals. Tupac denied suicidal ideation or self-harm behaviors. Curtis Lam and Curtis Lam shared that they have a neurologist appointment for brain scanning set up for this week and will get the results on July 22nd. Counselor shared concerns about his decline in functioning, needing to rule out an additional stroke or early onset dementia, and recommendations to improve daily functioning. Counselor shared about information given for his disabilities claim. Curtis Lam and Curtis Lam shared experiences and observations regarding his mental health and memory issues. Demaryius continues to be nauseous from psyc medications and has not seen significant improvement of symptoms. Counselor discussed coping strategies and daily habits encouraged to improve memory functioning and medication compliance. Counselor emphasized that Curtis Lam needs to be more hands on with his doctors appointments and daily care, as he is no longer able to remember simple tasks or information shared to and from medical professionals that is important for his well-being. Counselor praised the couple for their commitment to each other and to working towards wellness for [employer].   Assessment and Plan: Counselor will continue to meet with Curtis Lam to address treatment plan goals.  Kyzen will continue to follow recommendations of providers and implement skills learned in session.  Follow Up Instructions: Counselor will send information for next session via Webex.    I discussed the assessment and treatment plan with the patient. The patient was provided an opportunity to ask questions and all were answered. The patient agreed with the plan and demonstrated an understanding of the instructions.   The patient was advised to call back or seek an in-person evaluation if the symptoms worsen or if the condition fails to improve as anticipated.  I provided 50 minutes of non-face-to-face time during this encounter.   [name], LCSW

## 2019-06-22 ENCOUNTER — Encounter: Payer: Self-pay | Admitting: Internal Medicine

## 2019-06-29 ENCOUNTER — Other Ambulatory Visit: Payer: Self-pay | Admitting: Podiatry

## 2019-06-29 NOTE — Telephone Encounter (Signed)
Dr. Price as Dr. On call please advice 

## 2019-06-29 NOTE — Telephone Encounter (Signed)
LVM to pt. Advising of Gabapentin refill sent to his pharmacy

## 2019-07-03 ENCOUNTER — Ambulatory Visit (INDEPENDENT_AMBULATORY_CARE_PROVIDER_SITE_OTHER): Payer: BC Managed Care – PPO | Admitting: Psychiatry

## 2019-07-03 ENCOUNTER — Other Ambulatory Visit: Payer: Self-pay

## 2019-07-03 DIAGNOSIS — R41840 Attention and concentration deficit: Secondary | ICD-10-CM

## 2019-07-03 DIAGNOSIS — F411 Generalized anxiety disorder: Secondary | ICD-10-CM | POA: Diagnosis not present

## 2019-07-03 DIAGNOSIS — F331 Major depressive disorder, recurrent, moderate: Secondary | ICD-10-CM | POA: Diagnosis not present

## 2019-07-03 MED ORDER — BUPROPION HCL ER (SR) 200 MG PO TB12: 200.0000 mg | ORAL_TABLET | Freq: Two times a day (BID) | ORAL | 1 refills | Status: DC

## 2019-07-03 MED ORDER — ATOMOXETINE HCL 40 MG PO CAPS: ORAL_CAPSULE | ORAL | 0 refills | Status: DC

## 2019-07-03 MED ORDER — TEMAZEPAM 30 MG PO CAPS: 30.0000 mg | ORAL_CAPSULE | Freq: Every evening | ORAL | 1 refills | Status: DC | PRN

## 2019-07-03 NOTE — Progress Notes (Signed)
BH MD/PA/NP OP Progress Note  07/03/2019 9:29 AM Curtis Lam  MRN:  [id number] Interview was conducted by telephone and I verified that I was speaking with the correct person using two identifiers. I discussed the limitations of evaluation and management by telemedicine and  the availability of in person appointments. Patient expressed understanding and agreed to proceed.  Chief Complaint: Anxiety, concentration problems, some insomnia.  HPI: 58 yo married male with GADand MDDwho has also been struggling with problems with focusing, forgetfulness. His mood perhaps improved some after increase of the dose of bupropion SR to 200 mg bid. He has failed trials of venlafaxine, then duloxetine and most recently vortioxetine. The last two he could not tolerate due to nausea/vomiting. He has stopped taking Trintellic about two weeks ago and no longer experience GI sx. He has problems with sleep - sleeps 5-6 hours on average after taking Restoril 30 mg (earlier tried diazepam also without much benefit). Continues to complain aboutpoor concentration, forgetfulness. He has had two CVAs and family hx of alzheimer's disease (father).  He has recently been seen by Dr. Royston SinnerJoseph Miller (neurologist). Labs and brain MRI were not different from ones late last year. He may have undiagnosed ADD with no hx of treatment.He is continuing in counseling with Hilbert OdorBethany Morris LCSW. He is quite anxious that should he go back to work he will be making mistakes related to his "forgetfulness" - at home, where anxiety level is arguably lower, he still forgets to complete tasks, loses interest when reading a book after two-three pages, etc.ergy and Curtis Lam reports some improvement.   Visit Diagnosis:    ICD-10-CM   1. Concentration deficit  R41.840   2. Generalized anxiety disorder  F41.1   3. Moderate episode of recurrent major depressive disorder (HCC)  F33.1     Past Psychiatric History: Please see intake  H&P.  Past Medical History:  Past Medical History:  Diagnosis Date  . Anxiety   . Depression   . Diabetes mellitus, type II (HCC)   . Elevated cholesterol   . History of migraine headaches    resolved s/p stopping ibuprofen  . Rectal polyp   . Schizoaffective disorder (HCC)   . 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 COLONOSCOPY  01/02/13  . TONSILLECTOMY    . VASECTOMY      Family Psychiatric History: Reviewed.  Family History:  Family History  Problem Relation Age of Onset  . Alzheimer's disease Father   . Depression Sister   . Diabetes Son 30  . Cancer Paternal Aunt        uterine, ovarian  . Diabetes Maternal Grandfather   . Heart disease Maternal Grandfather     Social History:  Social History   Socioeconomic History  . Marital status: Married    Spouse name: Not on file  . Number of children: 2  . Years of education: Not on file  . Highest education level: High school graduate  Occupational History  . 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 Financial resource strain: Not hard at all  . Food insecurity    Worry: Never true    Inability: Never true  . Transportation needs    Medical: No    Non-medical: No  Tobacco Use  . Smoking status: Never Smoker  . Smokeless tobacco: Never Used  Substance and Sexual Activity  . Alcohol  use: Yes    Alcohol/week: 1.0 standard drinks    Types: 1 Cans of beer per week    Comment: 1 beer or wine 2-3 times per month, more in summer than winter  . Drug use: No  . Sexual activity: Yes    Partners: Female  Lifestyle  . Physical activity    Days per week: 0 days    Minutes per session: 0 min  . Stress: Very much  Relationships  . Social Musicianconnections    Talks on phone: Not on file    Gets together: Not on file    Attends religious service: Never    Active member of club or organization: Not on file     Attends meetings of clubs or organizations: Not on file    Relationship status: Married  Other Topics Concern  . Not on file  Social History Narrative   Lives with wife and son.  Other son lives in [place].  Wife suffers from depression, recent flare and hospitalization (12/2013); He lost his job 09/2014 for 4-5 weeks, and got new job    Allergies: No Known Allergies  Metabolic Disorder Labs: Lab Results  Component Value Date   HGBA1C 8.1 (H) 06/02/2019   MPG 126 (H) 01/23/2015   MPG 120 (H) 12/25/2013   No results found for: PROLACTIN Lab Results  Component Value Date   CHOL 97 06/02/2019   TRIG 128.0 06/02/2019   HDL 39.70 06/02/2019   CHOLHDL 2 06/02/2019   VLDL 25.6 06/02/2019   LDLCALC 32 06/02/2019   LDLCALC 110 (H) 08/11/2017   Lab Results  Component Value Date   TSH 2.589 01/23/2015   TSH 2.765 12/25/2013    Therapeutic Level Labs: No results found for: LITHIUM No results found for: VALPROATE No components found for:  CBMZ  Current Medications: Current Outpatient Medications  Medication Sig Dispense Refill  . temazepam (RESTORIL) 30 MG capsule Take 1 capsule (30 mg total) by mouth at bedtime as needed for sleep. 30 capsule 1  . amLODipine (NORVASC) 10 MG tablet Take 1 tablet (10 mg total) by mouth daily. 90 tablet 3  . atomoxetine (STRATTERA) 40 MG capsule Take 1 capsule (40 mg total) by mouth daily for 7 days, THEN 1 capsule (40 mg total) 2 (two) times daily with a meal for 23 days. 53 capsule 0  . atorvastatin (LIPITOR) 40 MG tablet TAKE 1 TABLET BY MOUTH DAILY AT 6 PM. 90 tablet 1  . buPROPion (WELLBUTRIN SR) 200 MG 12 hr tablet Take 1 tablet (200 mg total) by mouth 2 (two) times daily. Take one tablet in AM and one around 2-3 PM 60 tablet 1  . cholecalciferol (VITAMIN D3) 25 MCG (1000 UT) tablet Take by mouth.    . gabapentin (NEURONTIN) 100 MG capsule TAKE 1 CAPSULE BY MOUTH AT ONCE AT BEDTIME 90 capsule 0  . glucose blood (BAYER CONTOUR TEST) test strip Use as  instructed 100 each 12  . ketoconazole (NIZORAL) 2 % cream Apply 1 application topically daily. 60 g 2  . losartan (COZAAR) 25 MG tablet Take 1 tablet (25 mg total) by mouth daily. 90 tablet 3  . metFORMIN (GLUCOPHAGE) 1000 MG tablet Take 1 tablet (1,000 mg total) by mouth 2 (two) times daily with a meal. 180 tablet 3  . Multiple Vitamin (MULTIVITAMIN) tablet Take by mouth.    . Omega-3 Fatty Acids (FISH OIL) 1000 MG CAPS Take by mouth.    . QC ENTERIC ASPIRIN 325 MG EC  tablet   0  . raNITIdine HCl (ZANTAC PO) Take by mouth.    . Turmeric 450 MG CAPS Take by mouth.    . Vitamin D, Ergocalciferol, (DRISDOL) 1.25 MG (50000 UT) CAPS capsule      No current facility-administered medications for this visit.     Psychiatric Specialty Exam: Review of Systems  Psychiatric/Behavioral: Positive for memory loss. The patient is nervous/anxious and has insomnia.   All other systems reviewed and are negative.   There were no vitals taken for this visit.There is no height or weight on file to calculate BMI.  General Appearance: NA  Eye Contact:  NA  Speech:  Clear and Coherent and Normal Rate  Volume:  Normal  Mood:  Anxious  Affect:  NA  Thought Process:  Goal Directed  Orientation:  Full (Time, Place, and Person)  Thought Content: Logical   Suicidal Thoughts:  No  Homicidal Thoughts:  No  Memory:  Immediate;   Fair Recent;   Good Remote;   Good recall 4/5 in 3 minutes  Judgement:  Good  Insight:  Fair  Psychomotor Activity:  NA  Concentration:  Concentration: Fair  Recall:  Fair  Fund of Knowledge: Good  Language: Good  Akathisia:  NA  Handed:  Right  AIMS (if indicated): not done  Assets:  Communication Skills Desire for Improvement Financial Resources/Insurance Housing Resilience Social Support  ADL's:  Intact  Cognition: WNL  Sleep:  Fair   Screenings: GAD-7     [facility]  Total GAD-7 Score  8  3    Mini-Mental     [facility]  Total Score (max 30 points )  29    PHQ2-9     [facility] Nutrition from 05/31/2017 [facility]  PHQ-2 Total Score  4  2  0  0  PHQ-9 Total Score  13  8  -  -       Assessment and Plan: 58 yo married male with GADand MDDwho has also been struggling with problems with focusing, forgetfulness. His mood perhaps improved some after increase of the dose of bupropion SR to 200 mg bid. He has failed trials of venlafaxine, then duloxetine and most recently vortioxetine. The last two he could not tolerate due to nausea/vomiting. He has stopped taking Trintellic about two weeks ago and no longer experience GI sx. He has problems with sleep - sleeps 5-6 hours on average after taking Restoril 30 mg (earlier tried diazepam also without much benefit). Continues to complain aboutpoor concentration, forgetfulness. He has had two CVAs and family hx of alzheimer's disease (father).  He has recently been seen by Dr. Royston SinnerJoseph Miller (neurologist). Labs and brain MRI were not different from ones late last year. He may have undiagnosed ADD with no hx of treatment.He is continuing in counseling with Hilbert OdorBethany Morris LCSW. He is quite anxious that should he go back to work he will be making mistakes related to his "forgetfulness" - at home, where anxiety level is arguably lower, he still forgets to complete tasks, loses interest when reading a book after two-three pages, etc.ergy and Curtis Lam reports some improvement. Plan: Continue Wellbutrin SR  to 200 mg bid, temazepam 30 mg prn sleep. We will try Strattera for focusing probem - 40  mg daily x 1 week then increase to 40 mg bid. Next visit in one month.  The plan was discussed with patient who had an opportunity to ask questions and these were all answered. I spend 25 minutes in phone consultation with the patient.     Stephanie Acre, MD 07/03/2019, 9:30 AM

## 2019-07-12 DIAGNOSIS — Z8673 Personal history of transient ischemic attack (TIA), and cerebral infarction without residual deficits: Secondary | ICD-10-CM | POA: Diagnosis not present

## 2019-07-12 DIAGNOSIS — R413 Other amnesia: Secondary | ICD-10-CM | POA: Diagnosis not present

## 2019-07-12 DIAGNOSIS — I10 Essential (primary) hypertension: Secondary | ICD-10-CM | POA: Diagnosis not present

## 2019-07-12 DIAGNOSIS — I63311 Cerebral infarction due to thrombosis of right middle cerebral artery: Secondary | ICD-10-CM | POA: Diagnosis not present

## 2019-07-13 ENCOUNTER — Encounter: Payer: Self-pay | Admitting: Internal Medicine

## 2019-07-28 ENCOUNTER — Other Ambulatory Visit (HOSPITAL_COMMUNITY): Payer: Self-pay | Admitting: Psychiatry

## 2019-07-28 MED ORDER — ATOMOXETINE HCL 40 MG PO CAPS: 40.0000 mg | ORAL_CAPSULE | Freq: Two times a day (BID) | ORAL | 0 refills | Status: DC

## 2019-08-03 ENCOUNTER — Ambulatory Visit (INDEPENDENT_AMBULATORY_CARE_PROVIDER_SITE_OTHER): Payer: BC Managed Care – PPO | Admitting: Psychiatry

## 2019-08-03 ENCOUNTER — Other Ambulatory Visit: Payer: Self-pay

## 2019-08-03 DIAGNOSIS — R41840 Attention and concentration deficit: Secondary | ICD-10-CM | POA: Diagnosis not present

## 2019-08-03 DIAGNOSIS — F330 Major depressive disorder, recurrent, mild: Secondary | ICD-10-CM | POA: Diagnosis not present

## 2019-08-03 DIAGNOSIS — F411 Generalized anxiety disorder: Secondary | ICD-10-CM

## 2019-08-03 MED ORDER — TEMAZEPAM 30 MG PO CAPS: 30.0000 mg | ORAL_CAPSULE | Freq: Every evening | ORAL | 1 refills | Status: DC | PRN

## 2019-08-03 MED ORDER — BUPROPION HCL ER (SR) 200 MG PO TB12: 200.0000 mg | ORAL_TABLET | Freq: Two times a day (BID) | ORAL | 1 refills | Status: DC

## 2019-08-03 MED ORDER — ATOMOXETINE HCL 40 MG PO CAPS: 40.0000 mg | ORAL_CAPSULE | Freq: Two times a day (BID) | ORAL | 0 refills | Status: DC

## 2019-08-03 NOTE — Progress Notes (Signed)
BH MD/PA/NP OP Progress Note  08/03/2019 8:40 AM Elray BubaMichael A Shook  MRN:  [id number] Interview was conducted by phone and I verified that I was speaking with the correct person using two identifiers. I discussed the limitations of evaluation and management by telemedicine and  the availability of in person appointments. Patient expressed understanding and agreed to proceed.  Chief Complaint: Anxiety, memory problems.  HPI: 58 yo married male with GADand MDDwho has also been struggling with problems with focusing, forgetfulness. His mood improved some after increase of the dose of bupropion SR to 200 mg bid. He has failed trials of venlafaxine, duloxetine and most recently vortioxetine. The last two he could not tolerate due to nausea/vomiting. He has problems with sleep - sleeps 5-6 hours on average after taking Restoril30 mg(earlier tried diazepam also without much benefit). Continues to complain aboutpoor memory/ forgetfulness. He has had CVA/TIA and family hx of alzheimer's disease (father).  He has recently been seen by Dr. Royston SinnerJoseph Miller (neurologist). Labs and brain MRI were not different from ones late last year. He has complained about distractibility/lack of focus for  Most of his life and may have undiagnosed ADD with no hx of treatment.He was in counseling with Hilbert OdorBethany Morris LCSW. We have started him on Strattera a month ago and he is beginning to notice improvement in concentration but no positive change in memory. He also noticed some increase in anxiety. While it may be related to Strattera (and should subside as he continues to take it) it may also be related to him trying to find a new job. His previous one was terminated due to COVID-19.       Visit Diagnosis:    ICD-10-CM   1. Generalized anxiety disorder  F41.1   2. Major depressive disorder, recurrent episode, mild (HCC)  F33.0   3. Concentration deficit  R41.840     Past Psychiatric History: Please see intake  H&P.  Past Medical History:  Past Medical History:  Diagnosis Date  . Anxiety   . Depression   . Diabetes mellitus, type II (HCC)   . Elevated cholesterol   . History of migraine headaches    resolved s/p stopping ibuprofen  . Rectal polyp   . Schizoaffective disorder (HCC)   . 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 COLONOSCOPY  01/02/13  . TONSILLECTOMY    . VASECTOMY      Family Psychiatric History: Reviewed.  Family History:  Family History  Problem Relation Age of Onset  . Alzheimer's disease Father   . Depression Sister   . Diabetes Son 30  . Cancer Paternal Aunt        uterine, ovarian  . Diabetes Maternal Grandfather   . Heart disease Maternal Grandfather     Social History:  Social History   Socioeconomic History  . Marital status: Married    Spouse name: Not on file  . Number of children: 2  . Years of education: Not on file  . Highest education level: High school graduate  Occupational History  . 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 Financial resource strain: Not hard at all  . Food insecurity    Worry: Never true    Inability: Never true  . Transportation needs    Medical: No    Non-medical: No  Tobacco Use  . Smoking status: Never Smoker  . Smokeless  tobacco: Never Used  Substance and Sexual Activity  . Alcohol use: Yes    Alcohol/week: 1.0 standard drinks    Types: 1 Cans of beer per week    Comment: 1 beer or wine 2-3 times per month, more in summer than winter  . Drug use: No  . Sexual activity: Yes    Partners: Female  Lifestyle  . Physical activity    Days per week: 0 days    Minutes per session: 0 min  . Stress: Very much  Relationships  . Social Musicianconnections    Talks on phone: Not on file    Gets together: Not on file    Attends religious service: Never    Active member of club or organization: Not on file     Attends meetings of clubs or organizations: Not on file    Relationship status: Married  Other Topics Concern  . Not on file  Social History Narrative   Lives with wife and son.  Other son lives in [place].  Wife suffers from depression, recent flare and hospitalization (12/2013); He lost his job 09/2014 for 4-5 weeks, and got new job    Allergies: No Known Allergies  Metabolic Disorder Labs: Lab Results  Component Value Date   HGBA1C 8.1 (H) 06/02/2019   MPG 126 (H) 01/23/2015   MPG 120 (H) 12/25/2013   No results found for: PROLACTIN Lab Results  Component Value Date   CHOL 97 06/02/2019   TRIG 128.0 06/02/2019   HDL 39.70 06/02/2019   CHOLHDL 2 06/02/2019   VLDL 25.6 06/02/2019   LDLCALC 32 06/02/2019   LDLCALC 110 (H) 08/11/2017   Lab Results  Component Value Date   TSH 2.589 01/23/2015   TSH 2.765 12/25/2013    Therapeutic Level Labs: No results found for: LITHIUM No results found for: VALPROATE No components found for:  CBMZ  Current Medications: Current Outpatient Medications  Medication Sig Dispense Refill  . amLODipine (NORVASC) 10 MG tablet Take 1 tablet (10 mg total) by mouth daily. 90 tablet 3  . atomoxetine (STRATTERA) 40 MG capsule Take 1 capsule (40 mg total) by mouth 2 (two) times daily with a meal. 60 capsule 0  . atorvastatin (LIPITOR) 40 MG tablet TAKE 1 TABLET BY MOUTH DAILY AT 6 PM. 90 tablet 1  . buPROPion (WELLBUTRIN SR) 200 MG 12 hr tablet Take 1 tablet (200 mg total) by mouth 2 (two) times daily. Take one tablet in AM and one around 2-3 PM 60 tablet 1  . cholecalciferol (VITAMIN D3) 25 MCG (1000 UT) tablet Take by mouth.    . gabapentin (NEURONTIN) 100 MG capsule TAKE 1 CAPSULE BY MOUTH AT ONCE AT BEDTIME 90 capsule 0  . glucose blood (BAYER CONTOUR TEST) test strip Use as instructed 100 each 12  . ketoconazole (NIZORAL) 2 % cream Apply 1 application topically daily. 60 g 2  . losartan (COZAAR) 25 MG tablet Take 1 tablet (25 mg total) by mouth  daily. 90 tablet 3  . metFORMIN (GLUCOPHAGE) 1000 MG tablet Take 1 tablet (1,000 mg total) by mouth 2 (two) times daily with a meal. 180 tablet 3  . Multiple Vitamin (MULTIVITAMIN) tablet Take by mouth.    . Omega-3 Fatty Acids (FISH OIL) 1000 MG CAPS Take by mouth.    . QC ENTERIC ASPIRIN 325 MG EC tablet   0  . raNITIdine HCl (ZANTAC PO) Take by mouth.    . temazepam (RESTORIL) 30 MG capsule Take 1 capsule (30  mg total) by mouth at bedtime as needed for sleep. 30 capsule 1  . Turmeric 450 MG CAPS Take by mouth.    . Vitamin D, Ergocalciferol, (DRISDOL) 1.25 MG (50000 UT) CAPS capsule      No current facility-administered medications for this visit.      Psychiatric Specialty Exam: Review of Systems  Gastrointestinal: Positive for nausea.  Psychiatric/Behavioral: Positive for memory loss. The patient is nervous/anxious.   All other systems reviewed and are negative.   There were no vitals taken for this visit.There is no height or weight on file to calculate BMI.  General Appearance: NA  Eye Contact:  NA  Speech:  Clear and Coherent and Normal Rate  Volume:  Normal  Mood:  Anxious  Affect:  NA  Thought Process:  Goal Directed  Orientation:  Full (Time, Place, and Person)  Thought Content: Logical   Suicidal Thoughts:  No  Homicidal Thoughts:  No  Memory:  Immediate;   Fair Recent;   Fair Remote;   Good  Judgement:  Good  Insight:  Fair  Psychomotor Activity:  NA  Concentration:  Concentration: Fair  Recall:  Fair  Fund of Knowledge: Good  Language: Good  Akathisia:  Negative  Handed:  Right  AIMS (if indicated): not done  Assets:  Communication Skills Desire for Improvement Housing Resilience Social Support Talents/Skills  ADL's:  Intact  Cognition: WNL  Sleep:  Fair   Screenings: GAD-7     [facility]  Total GAD-7 Score  8  3     Mini-Mental     Office Visit from 01/20/2019 in [facility]  Total Score (max 30 points )  29    PHQ2-9     [facility] Nutrition from 05/31/2017 [facility]  PHQ-2 Total Score  4  2  0  0  PHQ-9 Total Score  13  8  -  -       Assessment and Plan: 58 yo married male with GADand MDDwho has also been struggling with problems with focusing, forgetfulness. His mood improved some after increase of the dose of bupropion SR to 200 mg bid. He has failed trials of venlafaxine, duloxetine and most recently vortioxetine. The last two he could not tolerate due to nausea/vomiting. He has problems with sleep - sleeps 5-6 hours on average after taking Restoril30 mg(earlier tried diazepam also without much benefit). Continues to complain aboutpoor memory/ forgetfulness. He has had CVA/TIA and family hx of alzheimer's disease (father).  He has recently been seen by Dr. Royston SinnerJoseph Miller (neurologist). Labs and brain MRI were not different from ones late last year. He has complained about distractibility/lack of focus for  Most of his life and may have undiagnosed ADD with no hx of treatment.He was in counseling with Hilbert OdorBethany Morris LCSW. We have started him on Strattera a month ago and he is beginning to notice improvement in concentration but no positive change in memory. He also noticed some increase in anxiety. While it may be related to Strattera (and should subside as he continues to take it) it may also be related to him trying to find a new job. His previous one was terminated due to  COVID-19.    Plan: Continue Wellbutrin SR to200 mg bid, temazepam 30 mg prn sleep and Strattera for focusing probem - 40 mg twice daily. We could inrease  it further to 60 mg bid if anxiety subsides and hie feels that concentration is still sub-optimal. Next visit in one month.  The plan was discussed with patient who had an opportunity to ask questions and these were all answered. I spend  25 minutes in phone consultation with the patient.      Stephanie Acre, MD 08/03/2019, 8:40 AM

## 2019-08-16 DIAGNOSIS — J310 Chronic rhinitis: Secondary | ICD-10-CM | POA: Diagnosis not present

## 2019-08-16 DIAGNOSIS — H903 Sensorineural hearing loss, bilateral: Secondary | ICD-10-CM | POA: Diagnosis not present

## 2019-08-16 DIAGNOSIS — H9313 Tinnitus, bilateral: Secondary | ICD-10-CM | POA: Diagnosis not present

## 2019-08-17 DIAGNOSIS — M545 Low back pain: Secondary | ICD-10-CM | POA: Diagnosis not present

## 2019-08-29 ENCOUNTER — Encounter: Payer: Self-pay | Admitting: Internal Medicine

## 2019-08-31 ENCOUNTER — Other Ambulatory Visit: Payer: Self-pay

## 2019-08-31 ENCOUNTER — Ambulatory Visit (INDEPENDENT_AMBULATORY_CARE_PROVIDER_SITE_OTHER): Payer: BC Managed Care – PPO | Admitting: Psychiatry

## 2019-08-31 ENCOUNTER — Encounter (HOSPITAL_COMMUNITY): Payer: Self-pay | Admitting: Psychiatry

## 2019-08-31 DIAGNOSIS — F330 Major depressive disorder, recurrent, mild: Secondary | ICD-10-CM | POA: Diagnosis not present

## 2019-08-31 DIAGNOSIS — R413 Other amnesia: Secondary | ICD-10-CM | POA: Diagnosis not present

## 2019-08-31 NOTE — Progress Notes (Signed)
Virtual Visit via Video Note  I connected with [name] on 08/31/19 at  3:00 PM EDT by a video enabled telemedicine application and verified that I am speaking with the correct person using two identifiers.  Location: Patient: Curtis Lam Provider: [name], LCSW   I discussed the limitations of evaluation and management by telemedicine and the availability of in person appointments. The patient expressed understanding and agreed to proceed.  History of Present Illness: Memory difficulties and MDD   Observations/Objective: Counselor met with Curtis Lam for individual therapy via Webex. Counselor assessed MH symptoms and progress on treatment plan goals. Curtis Lam denied suicidal ideation or self-harm behaviors. Curtis Lam shared that he has noted improvements in his ability to read and concentrate since he was prescribed a medication to address his ADD a couple months ago. Curtis Lam continues to endorse memory issues which significantly impact his daily functioning, such as forgetting to take his medications, getting lost while driving, difficulty recalling information or knowing why he walked to a different room in his home. Counselor discussed strategies to improve or cope better with memory issues. Curtis Lam shared many things he is trying to implement and his struggles with progress. Counselor explored his readiness and ability to hold a job considering his ongoing challenges. Counselor connected him with Vocational Rehabilitation to get more support in this area. Counselor will set up follow up appointments for continued monitoring.   Assessment and Plan: Counselor will continue to meet with patient to address treatment plan goals. Patient will continue to follow recommendations of providers and implement skills learned in session.  Follow Up Instructions: Counselor will send information for next session via Webex.     I discussed the assessment and treatment plan with the patient.  The patient was provided an opportunity to ask questions and all were answered. The patient agreed with the plan and demonstrated an understanding of the instructions.   The patient was advised to call back or seek an in-person evaluation if the symptoms worsen or if the condition fails to improve as anticipated.  I provided 45 minutes of non-face-to-face time during this encounter.   [name], LCSW

## 2019-09-01 ENCOUNTER — Other Ambulatory Visit (HOSPITAL_COMMUNITY): Payer: Self-pay | Admitting: Psychiatry

## 2019-09-04 ENCOUNTER — Ambulatory Visit (INDEPENDENT_AMBULATORY_CARE_PROVIDER_SITE_OTHER): Payer: BC Managed Care – PPO | Admitting: Internal Medicine

## 2019-09-04 ENCOUNTER — Other Ambulatory Visit: Payer: Self-pay

## 2019-09-04 ENCOUNTER — Encounter: Payer: Self-pay | Admitting: Internal Medicine

## 2019-09-04 VITALS — BP 130/82 | HR 91 | Temp 98.6°F | Ht 67.0 in | Wt 187.0 lb

## 2019-09-04 DIAGNOSIS — Z23 Encounter for immunization: Secondary | ICD-10-CM

## 2019-09-04 DIAGNOSIS — Z8673 Personal history of transient ischemic attack (TIA), and cerebral infarction without residual deficits: Secondary | ICD-10-CM | POA: Diagnosis not present

## 2019-09-04 DIAGNOSIS — E1140 Type 2 diabetes mellitus with diabetic neuropathy, unspecified: Secondary | ICD-10-CM

## 2019-09-04 DIAGNOSIS — R41840 Attention and concentration deficit: Secondary | ICD-10-CM | POA: Diagnosis not present

## 2019-09-04 LAB — POCT GLYCOSYLATED HEMOGLOBIN (HGB A1C): Hemoglobin A1C: 6.5 % — AB (ref 4.0–5.6)

## 2019-09-04 NOTE — Patient Instructions (Signed)
Your symptoms with the urination are likely coming from the prostate and there are options to help this if needed.    Benign Prostatic Hyperplasia  Benign prostatic hyperplasia (BPH) is an enlarged prostate gland that is caused by the normal aging process and not by cancer. The prostate is a walnut-sized gland that is involved in the production of semen. It is located in front of the rectum and below the bladder. The bladder stores urine and the urethra is the tube that carries the urine out of the body. The prostate may get bigger as a man gets older. An enlarged prostate can press on the urethra. This can make it harder to pass urine. The build-up of urine in the bladder can cause infection. Back pressure and infection may progress to bladder damage and kidney (renal) failure. What are the causes? This condition is part of a normal aging process. However, not all men develop problems from this condition. If the prostate enlarges away from the urethra, urine flow will not be blocked. If it enlarges toward the urethra and compresses it, there will be problems passing urine. What increases the risk? This condition is more likely to develop in men over the age of 90 years. What are the signs or symptoms? Symptoms of this condition include:  Getting up often during the night to urinate.  Needing to urinate frequently during the day.  Difficulty starting urine flow.  Decrease in size and strength of your urine stream.  Leaking (dribbling) after urinating.  Inability to pass urine. This needs immediate treatment.  Inability to completely empty your bladder.  Pain when you pass urine. This is more common if there is also an infection.  Urinary tract infection (UTI). How is this diagnosed? This condition is diagnosed based on your medical history, a physical exam, and your symptoms. Tests will also be done, such as:  A post-void bladder scan. This measures any amount of urine that may  remain in your bladder after you finish urinating.  A digital rectal exam. In a rectal exam, your health care provider checks your prostate by putting a lubricated, gloved finger into your rectum to feel the back of your prostate gland. This exam detects the size of your gland and any abnormal lumps or growths.  An exam of your urine (urinalysis).  A prostate specific antigen (PSA) screening. This is a blood test used to screen for prostate cancer.  An ultrasound. This test uses sound waves to electronically produce a picture of your prostate gland. Your health care provider may refer you to a specialist in kidney and prostate diseases (urologist). How is this treated? Once symptoms begin, your health care provider will monitor your condition (active surveillance or watchful waiting). Treatment for this condition will depend on the severity of your condition. Treatment may include:  Observation and yearly exams. This may be the only treatment needed if your condition and symptoms are mild.  Medicines to relieve your symptoms, including: ? Medicines to shrink the prostate. ? Medicines to relax the muscle of the prostate.  Surgery in severe cases. Surgery may include: ? Prostatectomy. In this procedure, the prostate tissue is removed completely through an open incision or with a laparoscope or robotics. ? Transurethral resection of the prostate (TURP). In this procedure, a tool is inserted through the opening at the tip of the penis (urethra). It is used to cut away tissue of the inner core of the prostate. The pieces are removed through the same opening  of the penis. This removes the blockage. ? Transurethral incision (TUIP). In this procedure, small cuts are made in the prostate. This lessens the prostate's pressure on the urethra. ? Transurethral microwave thermotherapy (TUMT). This procedure uses microwaves to create heat. The heat destroys and removes a small amount of prostate tissue. ?  Transurethral needle ablation (TUNA). This procedure uses radio frequencies to destroy and remove a small amount of prostate tissue. ? Interstitial laser coagulation (ILC). This procedure uses a laser to destroy and remove a small amount of prostate tissue. ? Transurethral electrovaporization (TUVP). This procedure uses electrodes to destroy and remove a small amount of prostate tissue. ? Prostatic urethral lift. This procedure inserts an implant to push the lobes of the prostate away from the urethra. Follow these instructions at home:  Take over-the-counter and prescription medicines only as told by your health care provider.  Monitor your symptoms for any changes. Contact your health care provider with any changes.  Avoid drinking large amounts of liquid before going to bed or out in public.  Avoid or reduce how much caffeine or alcohol you drink.  Give yourself time when you urinate.  Keep all follow-up visits as told by your health care provider. This is important. Contact a health care provider if:  You have unexplained back pain.  Your symptoms do not get better with treatment.  You develop side effects from the medicine you are taking.  Your urine becomes very dark or has a bad smell.  Your lower abdomen becomes distended and you have trouble passing your urine. Get help right away if:  You have a fever or chills.  You suddenly cannot urinate.  You feel lightheaded, or very dizzy, or you faint.  There are large amounts of blood or clots in the urine.  Your urinary problems become hard to manage.  You develop moderate to severe low back or flank pain. The flank is the side of your body between the ribs and the hip. These symptoms may represent a serious problem that is an emergency. Do not wait to see if the symptoms will go away. Get medical help right away. Call your local emergency services (911 in the [place] Do not drive yourself to the hospital. Summary  Benign  prostatic hyperplasia (BPH) is an enlarged prostate that is caused by the normal aging process and not by cancer.  An enlarged prostate can press on the urethra. This can make it hard to pass urine.  This condition is part of a normal aging process and is more likely to develop in men over the age of 50 years.  Get help right away if you suddenly cannot urinate. This information is not intended to replace advice given to you by your health care provider. Make sure you discuss any questions you have with your health care provider. Document Released: 12/07/2005 Document Revised: 11/01/2018 Document Reviewed: 01/11/2017 Elsevier Patient Education  2020 [employer]

## 2019-09-04 NOTE — Progress Notes (Signed)
   Subjective:   Patient ID: Curtis Lam, male    DOB: [birth year], 58 y.o.   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   Review of Systems  Constitutional: Negative.   HENT: Negative.   Eyes: Negative.   Respiratory: Negative for cough, chest tightness and shortness of breath.   Cardiovascular: Negative for chest pain, palpitations and leg swelling.  Gastrointestinal: Negative for abdominal distention, abdominal pain, constipation, diarrhea, nausea and vomiting.  Musculoskeletal: Negative.   Skin: Negative.   Neurological: Negative.        Memory impairment  Psychiatric/Behavioral: Positive for decreased concentration, dysphoric mood and sleep disturbance. Negative for self-injury and suicidal ideas. The patient is not nervous/anxious.     Objective:  Physical Exam Constitutional:      Appearance: He is well-developed.  HENT:     Head: Normocephalic and atraumatic.  Neck:     Musculoskeletal: Normal range of motion.  Cardiovascular:     Rate and Rhythm: Normal rate and regular rhythm.  Pulmonary:     Effort: Pulmonary effort is normal. No respiratory distress.     Breath sounds: Normal breath sounds. No wheezing or rales.  Abdominal:     General: Bowel sounds are normal. There is no distension.   Palpations: Abdomen is soft.     Tenderness: There is no abdominal tenderness. There is no rebound.  Skin:    General: Skin is warm and dry.  Neurological:     Mental Status: He is alert and oriented to person, place, and time.     Coordination: Coordination normal.     Vitals:   09/04/19 0808  BP: 130/82  Pulse: 91  Temp: 98.6 F (37 C)  TempSrc: Oral  SpO2: 97%  Weight: 187 lb (84.8 kg)  Height: 5\' 7"  (1.702 m)    Assessment & Plan:  Flu shot given at visit

## 2019-09-06 NOTE — Assessment & Plan Note (Signed)
HgA1c done today and at goal. Keep metformin the same. Adjust as needed.

## 2019-09-06 NOTE — Assessment & Plan Note (Signed)
Taking statin. Diabetes and blood pressure at goal. Talked to him about memory training to help with cognitive skills at home.

## 2019-09-06 NOTE — Assessment & Plan Note (Signed)
Asked him to discuss with psych when he sees them as he could be having a side effect of strattera although it does seem to be helping with concentration.

## 2019-09-07 ENCOUNTER — Encounter: Payer: Self-pay | Admitting: Internal Medicine

## 2019-09-07 ENCOUNTER — Other Ambulatory Visit: Payer: Self-pay

## 2019-09-07 ENCOUNTER — Ambulatory Visit (INDEPENDENT_AMBULATORY_CARE_PROVIDER_SITE_OTHER): Payer: BC Managed Care – PPO | Admitting: Psychiatry

## 2019-09-07 DIAGNOSIS — F330 Major depressive disorder, recurrent, mild: Secondary | ICD-10-CM | POA: Diagnosis not present

## 2019-09-07 DIAGNOSIS — H9103 Ototoxic hearing loss, bilateral: Secondary | ICD-10-CM | POA: Diagnosis not present

## 2019-09-07 DIAGNOSIS — F411 Generalized anxiety disorder: Secondary | ICD-10-CM

## 2019-09-07 DIAGNOSIS — F909 Attention-deficit hyperactivity disorder, unspecified type: Secondary | ICD-10-CM | POA: Insufficient documentation

## 2019-09-07 MED ORDER — ATOMOXETINE HCL 80 MG PO CAPS: 80.0000 mg | ORAL_CAPSULE | Freq: Every day | ORAL | 0 refills | Status: DC

## 2019-09-07 MED ORDER — TEMAZEPAM 30 MG PO CAPS: 30.0000 mg | ORAL_CAPSULE | Freq: Every evening | ORAL | 1 refills | Status: DC | PRN

## 2019-09-07 MED ORDER — BUPROPION HCL ER (XL) 300 MG PO TB24: 300.0000 mg | ORAL_TABLET | Freq: Every day | ORAL | 0 refills | Status: DC

## 2019-09-07 NOTE — Progress Notes (Signed)
BH MD/PA/NP OP Progress Note  09/07/2019 8:46 AM TRAEVION RARICK  MRN:  [id number] Interview was conducted by phone and I verified that I was speaking with the correct person using two identifiers. I discussed the limitations of evaluation and management by telemedicine and  the availability of in person appointments. Patient expressed understanding and agreed to proceed.  Chief Complaint: Memory problems.  HPI: 58 yo married male with GAD/MDDwho hasalso been struggling with problems with focusing, forgetfulness since elementary school. He has never been formally diagnosed or treated for ADHD, which he clearly has. We have added Wellbutrin (also for depressed mood) and Strattera and his focusing has improved. His mood improved as well after increase of the dose of bupropion SR to 200 mg bid. He has failed trials of venlafaxine, duloxetine and most recently vortioxetine. The last two he could not tolerate due to nausea/vomiting. He had problems with sleep but reports  Now sleeping well enough after takingRestoril30 mg(earlier tried diazepam also without much benefit). Continues to complain aboutpoor short term memory/ forgetfulness. He has had CVA/TIAand family hx of Alzheimer's disease (father). He has recently been seen by Dr. Royston Sinner (neurologist). Labs and brain MRI were not different from ones late last year.He remains in counseling with Hilbert Odor LCSW. He has occasional shaking/anxiety related to drops in blood sugar. He is on long term disability and is looking for a less demanding job; his previous one was terminated due to COVID-19.   Visit Diagnosis:    ICD-10-CM   1. Major depressive disorder, recurrent episode, mild (HCC)  F33.0   2. Generalized anxiety disorder  F41.1   3. Adult ADHD  F90.9     Past Psychiatric History: Please see intake H&P.  Past Medical History:  Past Medical History:  Diagnosis Date  . Anxiety   . Depression   . Diabetes mellitus, type  II (HCC)   . Elevated cholesterol   . History of migraine headaches    resolved s/p stopping ibuprofen  . Rectal polyp   . Schizoaffective disorder (HCC)   . 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 COLONOSCOPY  01/02/13  . TONSILLECTOMY    . VASECTOMY      Family Psychiatric History: Reviewed.  Family History:  Family History  Problem Relation Age of Onset  . Alzheimer's disease Father   . Depression Sister   . Diabetes Son 30  . Cancer Paternal Aunt        uterine, ovarian  . Diabetes Maternal Grandfather   . Heart disease Maternal Grandfather     Social History:  Social History   Socioeconomic History  . Marital status: Married    Spouse name: Not on file  . Number of children: 2  . Years of education: Not on file  . Highest education level: High school graduate  Occupational History  . Occupation: Engineer, mining)    Employer: [employer]  Social Needs  . Financial resource strain: Not hard at all  . Food insecurity    Worry: Never true    Inability: Never true  . Transportation needs    Medical: No    Non-medical: No  Tobacco Use  . Smoking status: Never Smoker  . Smokeless tobacco: Never Used  Substance and Sexual Activity  . Alcohol use: Yes    Alcohol/week: 1.0 standard drinks    Types: 1 Cans of beer per week  Comment: 1 beer or wine 2-3 times per month, more in summer than winter  . Drug use: No  . Sexual activity: Yes    Partners: Female  Lifestyle  . Physical activity    Days per week: 0 days    Minutes per session: 0 min  . Stress: Very much  Relationships  . Social Musicianconnections    Talks on phone: Not on file    Gets together: Not on file    Attends religious service: Never    Active member of club or organization: Not on file    Attends meetings of clubs or organizations: Not on file    Relationship status: Married  Other Topics Concern  . Not on file   Social History Narrative   Lives with wife and son.  Other son lives in [place].  Wife suffers from depression, recent flare and hospitalization (12/2013); He lost his job 09/2014 for 4-5 weeks, and got new job    Allergies: No Known Allergies  Metabolic Disorder Labs: Lab Results  Component Value Date   HGBA1C 6.5 (A) 09/04/2019   MPG 126 (H) 01/23/2015   MPG 120 (H) 12/25/2013   No results found for: PROLACTIN Lab Results  Component Value Date   CHOL 97 06/02/2019   TRIG 128.0 06/02/2019   HDL 39.70 06/02/2019   CHOLHDL 2 06/02/2019   VLDL 25.6 06/02/2019   LDLCALC 32 06/02/2019   LDLCALC 110 (H) 08/11/2017   Lab Results  Component Value Date   TSH 2.589 01/23/2015   TSH 2.765 12/25/2013    Therapeutic Level Labs: No results found for: LITHIUM No results found for: VALPROATE No components found for:  CBMZ  Current Medications: Current Outpatient Medications  Medication Sig Dispense Refill  . amLODipine (NORVASC) 10 MG tablet Take 1 tablet (10 mg total) by mouth daily. 90 tablet 3  . atomoxetine (STRATTERA) 80 MG capsule Take 1 capsule (80 mg total) by mouth daily. 90 capsule 0  . atorvastatin (LIPITOR) 40 MG tablet TAKE 1 TABLET BY MOUTH DAILY AT 6 PM. 90 tablet 1  . buPROPion (WELLBUTRIN XL) 300 MG 24 hr tablet Take 1 tablet (300 mg total) by mouth daily. 90 tablet 0  . [place] 450 MG CAPS Take by mouth.    . cholecalciferol (VITAMIN D3) 25 MCG (1000 UT) tablet Take by mouth.    . gabapentin (NEURONTIN) 100 MG capsule TAKE 1 CAPSULE BY MOUTH AT ONCE AT BEDTIME 90 capsule 0  . glucose blood (BAYER CONTOUR TEST) test strip Use as instructed 100 each 12  . ketoconazole (NIZORAL) 2 % cream Apply 1 application topically daily. 60 g 2  . losartan (COZAAR) 25 MG tablet Take 1 tablet (25 mg total) by mouth daily. 90 tablet 3  . metFORMIN (GLUCOPHAGE) 1000 MG tablet Take 1 tablet (1,000 mg total) by mouth 2 (two) times daily with a meal. 180 tablet 3  . Methylcobalamin  (B-12) 5000 MCG TBDP Take by mouth.    . Misc Natural Products (JOINT HEALTH PO) Take 40 mg by mouth.    . Multiple Vitamin (MULTIVITAMIN) tablet Take by mouth.    . Omega-3 Fatty Acids (FISH OIL) 1000 MG CAPS Take by mouth.    Marland Kitchen. omeprazole (PRILOSEC) 40 MG capsule     . QC ENTERIC ASPIRIN 325 MG EC tablet   0  . temazepam (RESTORIL) 30 MG capsule Take 1 capsule (30 mg total) by mouth at bedtime as needed for sleep. 30 capsule 1  .  Turmeric 450 MG CAPS Take 500 mg by mouth.      No current facility-administered medications for this visit.      Psychiatric Specialty Exam: Review of Systems  Neurological: Positive for tremors.  Psychiatric/Behavioral: Positive for memory loss. The patient is nervous/anxious and has insomnia.   All other systems reviewed and are negative.   There were no vitals taken for this visit.There is no height or weight on file to calculate BMI.  General Appearance: NA  Eye Contact:  NA  Speech:  Clear and Coherent and Normal Rate  Volume:  Normal  Mood:  Anxious  Affect:  NA  Thought Process:  Goal Directed and Linear  Orientation:  Full (Time, Place, and Person)  Thought Content: Logical   Suicidal Thoughts:  No  Homicidal Thoughts:  No  Memory:  Immediate;   Poor Recent;   Fair Remote;   Good Immediate recall 2 out of 5 items in 3 minutes  Judgement:  Good  Insight:  Good  Psychomotor Activity:  NA  Concentration:  Concentration: Fair  Recall:  Poor  Fund of Knowledge: Good  Language: Good  Akathisia:  Negative  Handed:  Right  AIMS (if indicated): not done  Assets:  Communication Skills Desire for Improvement Housing Resilience Social Support  ADL's:  Intact  Cognition: WNL  Sleep:  Fair   Screenings: GAD-7     Office Visit from 09/04/2019 in [place] Visit from 06/02/2019 in [place] Visit from 10/04/2018 in [facility]  Total GAD-7 Score   11  8  3     Mini-Mental     Office Visit from 01/20/2019 in [facility]  Total Score (max 30 points )  29    PHQ2-9     Office Visit from 09/04/2019 in [place] Visit from 06/02/2019 in [place] Visit from 10/04/2018 in [place] from 08/06/2017 in [place] [facility]  PHQ-2 Total Score  3  4  2   0  0  PHQ-9 Total Score  12  13  8   -  -       Assessment and Plan: 58 yo married male with GAD/MDDwho hasalso been struggling with problems with focusing, forgetfulness since elementary school. He has never been formally diagnosed or treated for ADHD, which he clearly has. We have added Wellbutrin (also for depressed mood) and Strattera and his focusing has improved. His mood improved as well after increase of the dose of bupropion SR to 200 mg bid. He has failed trials of venlafaxine, duloxetine and most recently vortioxetine. The last two he could not tolerate due to nausea/vomiting. He had problems with sleep but reports  Now sleeping well enough after takingRestoril30 mg(earlier tried diazepam also without much benefit). Continues to complain aboutpoor short term memory/ forgetfulness. He has had CVA/TIAand family hx of Alzheimer's disease (father). He has recently been seen by Dr. Laurena Slimmer (neurologist). Labs and brain MRI were not different from ones late last year.He remains in counseling with [name] LCSW. He has occasional shaking/anxiety related to drops in blood sugar. He is on long term disability and is looking for a less demanding job; his previous one was terminated due to COVID-19.   Plan:He sometimes forgets to take afternoon meds so we will change Wellbutrin  SR toXL 300 mg daily and combine both doses of Strattera 40 mg into one 80  mg in AM. We will continue temazepam30 mg prn sleep. Next visit in three months.The plan was discussed with patient who had an opportunity to ask questions and these were all answered. I spend 25 minutes in phone consultation with the patient.      Magdalene Patricialgierd A Misty Foutz, MD 09/07/2019, 8:46 AM

## 2019-09-07 NOTE — Progress Notes (Unsigned)
Abstracted and sent to scan  

## 2019-09-13 DIAGNOSIS — I10 Essential (primary) hypertension: Secondary | ICD-10-CM | POA: Diagnosis not present

## 2019-09-13 DIAGNOSIS — R413 Other amnesia: Secondary | ICD-10-CM | POA: Diagnosis not present

## 2019-09-13 DIAGNOSIS — I63311 Cerebral infarction due to thrombosis of right middle cerebral artery: Secondary | ICD-10-CM | POA: Diagnosis not present

## 2019-09-13 DIAGNOSIS — Z8673 Personal history of transient ischemic attack (TIA), and cerebral infarction without residual deficits: Secondary | ICD-10-CM | POA: Diagnosis not present

## 2019-09-14 ENCOUNTER — Encounter: Payer: Self-pay | Admitting: Internal Medicine

## 2019-09-14 DIAGNOSIS — M545 Low back pain: Secondary | ICD-10-CM | POA: Diagnosis not present

## 2019-09-18 ENCOUNTER — Other Ambulatory Visit: Payer: Self-pay | Admitting: *Deleted

## 2019-09-18 MED ORDER — GLUCOSE BLOOD VI STRP: 1.0000 | ORAL_STRIP | Freq: Every day | 12 refills | Status: DC

## 2019-09-19 ENCOUNTER — Other Ambulatory Visit: Payer: Self-pay | Admitting: Podiatry

## 2019-09-19 ENCOUNTER — Other Ambulatory Visit: Payer: Self-pay

## 2019-09-19 ENCOUNTER — Ambulatory Visit (INDEPENDENT_AMBULATORY_CARE_PROVIDER_SITE_OTHER): Payer: BC Managed Care – PPO | Admitting: Podiatry

## 2019-09-19 ENCOUNTER — Ambulatory Visit (INDEPENDENT_AMBULATORY_CARE_PROVIDER_SITE_OTHER): Payer: BC Managed Care – PPO

## 2019-09-19 DIAGNOSIS — R252 Cramp and spasm: Secondary | ICD-10-CM | POA: Diagnosis not present

## 2019-09-19 DIAGNOSIS — E1149 Type 2 diabetes mellitus with other diabetic neurological complication: Secondary | ICD-10-CM

## 2019-09-19 DIAGNOSIS — M722 Plantar fascial fibromatosis: Secondary | ICD-10-CM

## 2019-09-19 DIAGNOSIS — I639 Cerebral infarction, unspecified: Secondary | ICD-10-CM | POA: Insufficient documentation

## 2019-09-19 DIAGNOSIS — M79672 Pain in left foot: Secondary | ICD-10-CM

## 2019-09-19 DIAGNOSIS — E119 Type 2 diabetes mellitus without complications: Secondary | ICD-10-CM | POA: Insufficient documentation

## 2019-09-19 MED ORDER — DICLOFENAC SODIUM 1 % TD GEL: 2.0000 g | Freq: Four times a day (QID) | TRANSDERMAL | 2 refills | Status: DC

## 2019-09-19 NOTE — Patient Instructions (Signed)

## 2019-09-19 NOTE — Progress Notes (Signed)
Subjective: 58 year old male presents the office today for concerns of pain to the bottom of his left foot he feel that there is a tendon "rolling" this causes quite a bit of pain at times.  He says it hurts worse when going barefoot is been under the last 1 week.  No recent injury.  No swelling.  He had a 2 to 3-day history of cramping the top of the feet at nighttime.  He states he has not been as active as he has been previously.  He states that he started the gabapentin for neuropathy symptoms have improved.  No side effects. Denies any systemic complaints such as fevers, chills, nausea, vomiting. No acute changes since last appointment, and no other complaints at this time.   Objective: AAO x3, NAD DP/PT pulses palpable bilaterally, CRT less than 3 seconds Mild bone spurring present dorsal aspect of the midfoot but no specific area of tenderness elicited to the foot other than he is getting tenderness on the medial band plantar fascia on the distal portion of the fascia.  Ligament is very tight.  There is no area pinpoint tenderness. No pain with calf compression, swelling, warmth, erythema  Assessment: Plantar fasciitis left foot, neuropathy  Plan: -All treatment options discussed with the patient including all alternatives, risks, complications.  -X-rays obtained reviewed.  No evidence of acute fracture. -In regards to the plantar fasciitis we discussed stretching, icing daily.  Discussed shoe modifications and orthotics.  Hopefully this will also help with the cramping the tops of the feet.  Voltaren gel. -Continue current dose of gabapentin. -Patient encouraged to call the office with any questions, concerns, change in symptoms.   Return in about 6 weeks (around 10/31/2019), or if symptoms worsen or fail to improve.  Trula Slade DPM

## 2019-09-26 ENCOUNTER — Telehealth: Payer: Self-pay | Admitting: *Deleted

## 2019-09-26 NOTE — Telephone Encounter (Signed)
Called and left a message for the patient to call me back about the voltaren gel and left the [place] office number [phone]. Lattie Haw

## 2019-10-02 ENCOUNTER — Telehealth: Payer: Self-pay | Admitting: *Deleted

## 2019-10-02 NOTE — Telephone Encounter (Signed)
Called and left the patient a message stating that Per Dr Jacqualyn Posey, the voltaren gel 1% can be purchased over the counter and I stated to call the [place] office if any concerns or questions at [phone]. Lattie Haw

## 2019-10-20 ENCOUNTER — Other Ambulatory Visit: Payer: Self-pay | Admitting: Podiatry

## 2019-10-23 ENCOUNTER — Other Ambulatory Visit: Payer: Self-pay

## 2019-10-23 ENCOUNTER — Encounter (HOSPITAL_COMMUNITY): Payer: Self-pay | Admitting: Psychiatry

## 2019-10-23 ENCOUNTER — Ambulatory Visit (INDEPENDENT_AMBULATORY_CARE_PROVIDER_SITE_OTHER): Payer: BC Managed Care – PPO | Admitting: Psychiatry

## 2019-10-23 DIAGNOSIS — F330 Major depressive disorder, recurrent, mild: Secondary | ICD-10-CM | POA: Diagnosis not present

## 2019-10-23 DIAGNOSIS — R413 Other amnesia: Secondary | ICD-10-CM | POA: Diagnosis not present

## 2019-10-23 NOTE — Progress Notes (Signed)
Virtual Visit via Video Note  I connected with [name] on 10/23/19 at  1:00 PM EST by a video enabled telemedicine application and verified that I am speaking with the correct person using two identifiers.  Location: Patient: Curtis Lam Provider: [name], LCSW   I discussed the limitations of evaluation and management by telemedicine and the availability of in person appointments. The patient expressed understanding and agreed to proceed.  History of Present Illness: MDD, Severe and Memory Issues   Observations/Objective: Counselor met with [name] for individual therapy via Webex. Counselor assessed MH symptoms and progress on treatment plan goals. Dmario presented with severe depression and high anxiety. Rourke endorsed suicidal ideations related to hopelessness and lack of quality of life due to cognitive functioning and denied self-harm behaviors. He stated that his wife is his main and only motivating factor for living. He does not have a clear plan related to suicidal ideations. Counselor reviewed safety plan and crisis numbers/services. He refuses to join and participate in support groups or treatment groups, due to fear of vulnerability and taking on others depression and life stressors. Kensley shared that he continues to have significant memory issues which impact his daily functioning, ie forgetting to take medications, forgetting steps to tasks, extreme fatigue after completing small tasks, issues with recalling information and getting overwhelmed with information intake. Medications were recently changed, he is set up to meet with Neurologist, reporting pessimism with treatment findings and outcomes. Alfonsa shared concerns about his future and ability to return to work. Counselor explored purpose and meaning in life, related to family connections and relationship with pets. Counselor discussed strategies in addressing memory issues, giving Ricard homework and  challenges to attempt between now and next session. Jayion expressed high anxieties and concerns about the upcoming election results. We discussed balancing exposure to input of information and taking emotional breaks to regulate, reflect and evaluate. Counselor shared psychoeducation on locus of control. Counselor reiterated use of crisis and safety plan to reduce risk of suicide attempts.   Assessment and Plan: Counselor will continue to meet with patient to address treatment plan goals. Patient will continue to follow recommendations of providers and implement skills learned in session.  Follow Up Instructions: Counselor will send information for next session via Webex.     I discussed the assessment and treatment plan with the patient. The patient was provided an opportunity to ask questions and all were answered. The patient agreed with the plan and demonstrated an understanding of the instructions.   The patient was advised to call back or seek an in-person evaluation if the symptoms worsen or if the condition fails to improve as anticipated.  I provided 50 minutes of non-face-to-face time during this encounter.   [name], LCSW

## 2019-10-31 ENCOUNTER — Other Ambulatory Visit: Payer: Self-pay

## 2019-10-31 ENCOUNTER — Ambulatory Visit: Payer: BC Managed Care – PPO | Admitting: Podiatry

## 2019-10-31 DIAGNOSIS — M722 Plantar fascial fibromatosis: Secondary | ICD-10-CM

## 2019-10-31 DIAGNOSIS — B351 Tinea unguium: Secondary | ICD-10-CM

## 2019-10-31 DIAGNOSIS — E1149 Type 2 diabetes mellitus with other diabetic neurological complication: Secondary | ICD-10-CM

## 2019-10-31 MED ORDER — CICLOPIROX 8 % EX SOLN: Freq: Every day | CUTANEOUS | 4 refills | Status: DC

## 2019-10-31 NOTE — Progress Notes (Signed)
Subjective: 58 year old male presents the office for follow-up evaluation of plantar fasciitis as well as neuropathy.  He states that overall he is doing much better he is having no pain or soreness.  He has Voltaren gel is been helpful.  Is been continue the gabapentin which is been helpful as well.  He is interested in starting some topical medicine for nail fungus. Denies any systemic complaints such as fevers, chills, nausea, vomiting. No acute changes since last appointment, and no other complaints at this time.   Objective: AAO x3, NAD DP/PT pulses palpable bilaterally, CRT less than 3 seconds There is no tenderness palpation on plantar aspect of the heel in the course or insertion of the Achilles tendon or plantar fascia.  Plantar fascial, Achilles tendon appear to be intact.  There is no area of tenderness identified.  The nails are mildly hypertrophic, dystrophic with yellow-brown discoloration.  No open lesions or pre-ulcerative lesions.  No pain with calf compression, swelling, warmth, erythema  Assessment: 58 year old male resolved plan fasciitis; neuropathy; onychomycosis  Plan: -All treatment options discussed with the patient including all alternatives, risks, complications.  -Continue stretching exercises for plantar fasciitis as well as wearing supportive shoes and orthotics. -Continue current dose of gabapentin -Penlac for nail fungus. -Patient encouraged to call the office with any questions, concerns, change in symptoms.   Trula Slade DPM

## 2019-10-31 NOTE — Patient Instructions (Addendum)
Ciclopirox nail solution What is this medicine? CICLOPIROX (sye kloe PEER ox) NAIL SOLUTION is an antifungal medicine. It used to treat fungal infections of the nails. This medicine may be used for other purposes; ask your health care provider or pharmacist if you have questions. COMMON BRAND NAME(S): [id number], Penlac What should I tell my health care provider before I take this medicine? They need to know if you have any of these conditions:  diabetes mellitus  history of seizures  HIV infection  immune system problems or organ transplant  large areas of burned or damaged skin  peripheral vascular disease or poor circulation  taking corticosteroid medication (including steroid inhalers, cream, or lotion)  an unusual or allergic reaction to ciclopirox, isopropyl alcohol, other medicines, foods, dyes, or preservatives  pregnant or trying to get pregnant  breast-feeding How should I use this medicine? This medicine is for external use only. Follow the directions that come with this medicine exactly. Wash and dry your hands before use. Avoid contact with the eyes, mouth or nose. If you do get this medicine in your eyes, rinse out with plenty of cool tap water. Contact your doctor or health care professional if eye irritation occurs. Use at regular intervals. Do not use your medicine more often than directed. Finish the full course prescribed by your doctor or health care professional even if you think you are better. Do not stop using except on your doctor's advice. Talk to your pediatrician regarding the use of this medicine in children. While this medicine may be prescribed for children as young as 12 years for selected conditions, precautions do apply. Overdosage: If you think you have taken too much of this medicine contact a poison control center or emergency room at once. NOTE: This medicine is only for you. Do not share this medicine with others. What if I miss a dose? If you miss a  dose, use it as soon as you can. If it is almost time for your next dose, use only that dose. Do not use double or extra doses. What may interact with this medicine? Interactions are not expected. Do not use any other skin products without telling your doctor or health care professional. This list may not describe all possible interactions. Give your health care provider a list of all the medicines, herbs, non-prescription drugs, or dietary supplements you use. Also tell them if you smoke, drink alcohol, or use illegal drugs. Some items may interact with your medicine. What should I watch for while using this medicine? Tell your doctor or health care professional if your symptoms get worse. Four to six months of treatment may be needed for the nail(s) to improve. Some people may not achieve a complete cure or clearing of the nails by this time. Tell your doctor or health care professional if you develop sores or blisters that do not heal properly. If your nail infection returns after stopping using this product, contact your doctor or health care professional. What side effects may I notice from receiving this medicine? Side effects that you should report to your doctor or health care professional as soon as possible:  allergic reactions like skin rash, itching or hives, swelling of the face, lips, or tongue  severe irritation, redness, burning, blistering, peeling, swelling, oozing Side effects that usually do not require medical attention (report to your doctor or health care professional if they continue or are bothersome):  mild reddening of the skin  nail discoloration  temporary burning or mild   stinging at the site of application This list may not describe all possible side effects. Call your doctor for medical advice about side effects. You may report side effects to FDA at 1-800-FDA-1088. Where should I keep my medicine? Keep out of the reach of children. Store at room temperature  between 15 and 30 degrees C (59 and 86 degrees F). Do not freeze. Protect from light by storing the bottle in the carton after every use. This medicine is flammable. Keep away from heat and flame. Throw away any unused medicine after the expiration date. NOTE: This sheet is a summary. It may not cover all possible information. If you have questions about this medicine, talk to your doctor, pharmacist, or health care provider.  2020 Elsevier/Gold Standard (2008-03-12 16:49:20) Diabetes Mellitus and Foot Care Foot care is an important part of your health, especially when you have diabetes. Diabetes may cause you to have problems because of poor blood flow (circulation) to your feet and legs, which can cause your skin to:  Become thinner and drier.  Break more easily.  Heal more slowly.  Peel and crack. You may also have nerve damage (neuropathy) in your legs and feet, causing decreased feeling in them. This means that you may not notice minor injuries to your feet that could lead to more serious problems. Noticing and addressing any potential problems early is the best way to prevent future foot problems. How to care for your feet Foot hygiene  Wash your feet daily with warm water and mild soap. Do not use hot water. Then, pat your feet and the areas between your toes until they are completely dry. Do not soak your feet as this can dry your skin.  Trim your toenails straight across. Do not dig under them or around the cuticle. File the edges of your nails with an emery board or nail file.  Apply a moisturizing lotion or petroleum jelly to the skin on your feet and to dry, brittle toenails. Use lotion that does not contain alcohol and is unscented. Do not apply lotion between your toes. Shoes and socks  Wear clean socks or stockings every day. Make sure they are not too tight. Do not wear knee-high stockings since they may decrease blood flow to your legs.  Wear shoes that fit properly and  have enough cushioning. Always look in your shoes before you put them on to be sure there are no objects inside.  To break in new shoes, wear them for just a few hours a day. This prevents injuries on your feet. Wounds, scrapes, corns, and calluses  Check your feet daily for blisters, cuts, bruises, sores, and redness. If you cannot see the bottom of your feet, use a mirror or ask someone for help.  Do not cut corns or calluses or try to remove them with medicine.  If you find a minor scrape, cut, or break in the skin on your feet, keep it and the skin around it clean and dry. You may clean these areas with mild soap and water. Do not clean the area with peroxide, alcohol, or iodine.  If you have a wound, scrape, corn, or callus on your foot, look at it several times a day to make sure it is healing and not infected. Check for: ? Redness, swelling, or pain. ? Fluid or blood. ? Warmth. ? Pus or a bad smell. General instructions  Do not cross your legs. This may decrease blood flow to your feet.  Do   not use heating pads or hot water bottles on your feet. They may burn your skin. If you have lost feeling in your feet or legs, you may not know this is happening until it is too late.  Protect your feet from hot and cold by wearing shoes, such as at the beach or on hot pavement.  Schedule a complete foot exam at least once a year (annually) or more often if you have foot problems. If you have foot problems, report any cuts, sores, or bruises to your health care provider immediately. Contact a health care provider if:  You have a medical condition that increases your risk of infection and you have any cuts, sores, or bruises on your feet.  You have an injury that is not healing.  You have redness on your legs or feet.  You feel burning or tingling in your legs or feet.  You have pain or cramps in your legs and feet.  Your legs or feet are numb.  Your feet always feel cold.  You have  pain around a toenail. Get help right away if:  You have a wound, scrape, corn, or callus on your foot and: ? You have pain, swelling, or redness that gets worse. ? You have fluid or blood coming from the wound, scrape, corn, or callus. ? Your wound, scrape, corn, or callus feels warm to the touch. ? You have pus or a bad smell coming from the wound, scrape, corn, or callus. ? You have a fever. ? You have a red line going up your leg. Summary  Check your feet every day for cuts, sores, red spots, swelling, and blisters.  Moisturize feet and legs daily.  Wear shoes that fit properly and have enough cushioning.  If you have foot problems, report any cuts, sores, or bruises to your health care provider immediately.  Schedule a complete foot exam at least once a year (annually) or more often if you have foot problems. This information is not intended to replace advice given to you by your health care provider. Make sure you discuss any questions you have with your health care provider. Document Released: 12/04/2000 Document Revised: 01/19/2018 Document Reviewed: 01/08/2017 Elsevier Patient Education  2020 Elsevier Inc[employer]  

## 2019-11-06 ENCOUNTER — Ambulatory Visit (HOSPITAL_COMMUNITY): Payer: BC Managed Care – PPO | Admitting: Psychiatry

## 2019-11-06 ENCOUNTER — Other Ambulatory Visit: Payer: Self-pay

## 2019-11-06 DIAGNOSIS — M722 Plantar fascial fibromatosis: Secondary | ICD-10-CM | POA: Insufficient documentation

## 2019-11-06 DIAGNOSIS — B351 Tinea unguium: Secondary | ICD-10-CM | POA: Insufficient documentation

## 2019-11-11 ENCOUNTER — Other Ambulatory Visit: Payer: Self-pay | Admitting: Internal Medicine

## 2019-11-13 DIAGNOSIS — M79676 Pain in unspecified toe(s): Secondary | ICD-10-CM

## 2019-11-24 ENCOUNTER — Encounter: Payer: Self-pay | Admitting: Internal Medicine

## 2019-11-29 ENCOUNTER — Other Ambulatory Visit (HOSPITAL_COMMUNITY): Payer: Self-pay | Admitting: Psychiatry

## 2019-12-05 ENCOUNTER — Other Ambulatory Visit: Payer: Self-pay

## 2019-12-05 ENCOUNTER — Encounter (HOSPITAL_COMMUNITY): Payer: Self-pay | Admitting: Psychiatry

## 2019-12-05 ENCOUNTER — Ambulatory Visit (INDEPENDENT_AMBULATORY_CARE_PROVIDER_SITE_OTHER): Payer: BC Managed Care – PPO | Admitting: Psychiatry

## 2019-12-05 DIAGNOSIS — R413 Other amnesia: Secondary | ICD-10-CM

## 2019-12-05 DIAGNOSIS — F330 Major depressive disorder, recurrent, mild: Secondary | ICD-10-CM

## 2019-12-05 NOTE — Progress Notes (Signed)
Virtual Visit via Video Note  I connected with Curtis Lam Curtis 12/05/19 at  3:00 PM EST by a video enabled telemedicine application and verified that I am speaking with the correct person using two identifiers.  Location: Patient: Curtis Lam Provider: [name], LCSW   I discussed the limitations of evaluation and management by telemedicine and the availability of Curtis person appointments. The patient expressed understanding and agreed to proceed.  History of Present Illness: Memory Issues and MDD   Observations/Objective: Counselor met with Curtis Lam for individual therapy via Webex. Counselor assessed MH symptoms and progress Curtis treatment plan goals. Curtis Lam with moderate to severe depression and moderate anxiety, with increasing impairment Curtis memory function. Curtis Lam suicidal ideation or self-harm behaviors. Curtis Lam that he has become very concerned with his ability to operate his car. He remarked that he has to be very focused and becomes dizzy if he looks Curtis his periphrial or turns his head too often. Counselor addressed safety and medicaiton concerns and recommended that he discuss with his providers. When assessing daily Lam, Curtis Lam that he continues to have difficulties remembering to take his medications consistently, missing multiple dosages a week. He reports lack of social contact and social communication with others. He notes decompensation Curtis comprehension, which causes him anxiety. Counselor offered a variety of coping strategies and considerations for treatment interventions. Kellen was unable to connect or envision application of skills. [place] Lam being willing to work Curtis his garden preparation for spring and summer. Counselor to connect with Shiraz's Lam to share resources and gather concerns/observations Curtis Lam.   Assessment and Plan: Counselor will continue to meet with patient to address treatment plan goals.  Patient will continue to follow recommendations of providers and implement skills learned Curtis session.  Follow Up Instructions: Counselor will send information for next session via Webex.     I discussed the assessment and treatment plan with the patient. The patient was provided an opportunity to ask questions and all were answered. The patient agreed with the plan and demonstrated an understanding of the instructions.   The patient was advised to call back or seek an Curtis-person evaluation if the symptoms worsen or if the condition fails to improve as anticipated.  I provided 30 minutes of non-face-to-face time during this encounter.   [name], LCSW

## 2019-12-06 ENCOUNTER — Ambulatory Visit (INDEPENDENT_AMBULATORY_CARE_PROVIDER_SITE_OTHER): Payer: BC Managed Care – PPO | Admitting: Psychiatry

## 2019-12-06 ENCOUNTER — Other Ambulatory Visit: Payer: Self-pay

## 2019-12-06 DIAGNOSIS — F909 Attention-deficit hyperactivity disorder, unspecified type: Secondary | ICD-10-CM

## 2019-12-06 DIAGNOSIS — F330 Major depressive disorder, recurrent, mild: Secondary | ICD-10-CM | POA: Diagnosis not present

## 2019-12-06 DIAGNOSIS — F411 Generalized anxiety disorder: Secondary | ICD-10-CM

## 2019-12-06 MED ORDER — ATOMOXETINE HCL 80 MG PO CAPS: 80.0000 mg | ORAL_CAPSULE | Freq: Every day | ORAL | 1 refills | Status: DC

## 2019-12-06 MED ORDER — TEMAZEPAM 30 MG PO CAPS: ORAL_CAPSULE | ORAL | 1 refills | Status: DC

## 2019-12-06 MED ORDER — BUPROPION HCL ER (SR) 150 MG PO TB12: 300.0000 mg | ORAL_TABLET | Freq: Every day | ORAL | 2 refills | Status: DC

## 2019-12-06 NOTE — Progress Notes (Signed)
BH MD/PA/NP OP Progress Note  12/06/2019 9:14 AM Curtis Lam  MRN:  [id number] Interview was conducted by phone and I verified that I was speaking with the correct person using two identifiers. I discussed the limitations of evaluation and management by telemedicine and  the availability of in person appointments. Patient expressed understanding and agreed to proceed.  Chief Complaint: Less energy, still having problems with forgetfulness.  HPI: 58 yo married male with GAD/MDDwho hasalso been struggling with problems with focusing, forgetfulness since elementary school. He has never been formally diagnosed or treated for ADHD, which he clearly has. We have added Wellbutrin (also for depressed mood) and Strattera and his focusing has improved. His mood improved as well after increase of the dose of bupropion SR to 200 mg bid but he would often forget to take afternoon dose. We then changed it to XR 300 mg and he noticed that it does not work as well as previous form did. Less energy, less motivation after the change. He has failed trials of venlafaxine, duloxetine and most recently vortioxetine. The last two he could not tolerate due to nausea/vomiting. He had problems with sleep but reports sleeping well enough after takingRestoril30 mg(earlier tried diazepam also without much benefit). Most recently he has been taking over the counter sleep aid and using temazepam only as a "backup". Casimiro NeedleMichael continues to complain aboutpoorshort term memory/forgetfulness. He has had CVA/TIAand family hx of Alzheimer's disease (father). He has recently been seen by Dr. Royston SinnerJoseph Miller (neurologist) and will see him again next month. Labs and brain MRI were not different from ones late last year.Heremains in counseling with Hilbert OdorBethany Morris LCSW every two weeks. He is on long term disability and is looking for a less demanding job; he said the previous one was terminated due to COVID-19.  Visit Diagnosis:     ICD-10-CM   1. Adult ADHD  F90.9   2. Generalized anxiety disorder  F41.1   3. Major depressive disorder, recurrent episode, mild (HCC)  F33.0     Past Psychiatric History: Please see intake H&P.  Past Medical History:  Past Medical History:  Diagnosis Date  . Anxiety   . Depression   . Diabetes mellitus, type II (HCC)   . Elevated cholesterol   . History of migraine headaches    resolved s/p stopping ibuprofen  . Rectal polyp   . Schizoaffective disorder (HCC)   . 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 COLONOSCOPY  01/02/13  . TONSILLECTOMY    . VASECTOMY      Family Psychiatric History: Reviewed.  Family History:  Family History  Problem Relation Age of Onset  . Alzheimer's disease Father   . Depression Sister   . Diabetes Son 30  . Cancer Paternal Aunt        uterine, ovarian  . Diabetes Maternal Grandfather   . Heart disease Maternal Grandfather     Social History:  Social History   Socioeconomic History  . Marital status: Married    Spouse name: Not on file  . Number of children: 2  . Years of education: Not on file  . Highest education level: High school graduate  Occupational History  . 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 Smoking status: Never Smoker  . Smokeless tobacco: Never Used  Substance and Sexual Activity  . Alcohol use: Yes    Alcohol/week:  1.0 standard drinks    Types: 1 Cans of beer per week    Comment: 1 beer or wine 2-3 times per month, more in summer than winter  . Drug use: No  . Sexual activity: Yes    Partners: Female  Other Topics Concern  . Not on file  Social History Narrative   Lives with wife and son.  Other son lives in [place].  Wife suffers from depression, recent flare and hospitalization (12/2013); He lost his job 09/2014 for 4-5 weeks, and got new job   Social Determinants of Radio broadcast assistant  Strain: [place]   . Difficulty of Paying Living Expenses: Not hard at all  Food Insecurity: No Food Insecurity  . Worried About Charity fundraiser in the Last Year: Never true  . Ran Out of Food in the Last Year: Never true  Transportation Needs: No Transportation Needs  . Lack of Transportation (Medical): No  . Lack of Transportation (Non-Medical): No  Physical Activity: Inactive  . Days of Exercise per Week: 0 days  . Minutes of Exercise per Session: 0 min  Stress: Stress Concern Present  . Feeling of Stress : Very much  Social Connections: Unknown  . Frequency of Communication with Friends and Family: Not on file  . Frequency of Social Gatherings with Friends and Family: Not on file  . Attends Religious Services: Never  . Active Member of Clubs or Organizations: Not on file  . Attends Archivist Meetings: Not on file  . Marital Status: Married    Allergies: No Known Allergies  Metabolic Disorder Labs: Lab Results  Component Value Date   HGBA1C 6.5 (A) 09/04/2019   MPG 126 (H) 01/23/2015   MPG 120 (H) 12/25/2013   No results found for: PROLACTIN Lab Results  Component Value Date   CHOL 97 06/02/2019   TRIG 128.0 06/02/2019   HDL 39.70 06/02/2019   CHOLHDL 2 06/02/2019   VLDL 25.6 06/02/2019   LDLCALC 32 06/02/2019   LDLCALC 110 (H) 08/11/2017   Lab Results  Component Value Date   TSH 2.589 01/23/2015   TSH 2.765 12/25/2013    Therapeutic Level Labs: No results found for: LITHIUM No results found for: VALPROATE No components found for:  CBMZ  Current Medications: Current Outpatient Medications  Medication Sig Dispense Refill  . amLODipine (NORVASC) 10 MG tablet Take 1 tablet (10 mg total) by mouth daily. 90 tablet 3  . atomoxetine (STRATTERA) 80 MG capsule Take 1 capsule (80 mg total) by mouth daily. 90 capsule 1  . atorvastatin (LIPITOR) 40 MG tablet TAKE 1 TABLET BY MOUTH DAILY AT 6 PM. 90 tablet 1  . buPROPion (WELLBUTRIN SR) 150 MG 12 hr  tablet Take 2 tablets (300 mg total) by mouth daily. 60 tablet 2  . [place] 450 MG CAPS Take by mouth.    . cholecalciferol (VITAMIN D3) 25 MCG (1000 UT) tablet Take by mouth.    . ciclopirox (PENLAC) 8 % solution Apply topically at bedtime. Apply over nail and surrounding skin. Apply daily over previous coat. After seven (7) days, may remove with alcohol and continue cycle. 6.6 mL 4  . diclofenac sodium (VOLTAREN) 1 % GEL Apply 2 g topically 4 (four) times daily. Rub into affected area of foot 2 to 4 times daily 100 g 2  . gabapentin (NEURONTIN) 100 MG capsule TAKE 1 CAPSULE BY MOUTH AT BEDTIME 90 capsule 0  . glucose blood (BAYER CONTOUR TEST) test  strip 1 each by Other route daily. Use as instructed.  Dx: E11.69 100 each 12  . ketoconazole (NIZORAL) 2 % cream Apply 1 application topically daily. 60 g 2  . losartan (COZAAR) 25 MG tablet Take 1 tablet (25 mg total) by mouth daily. 90 tablet 3  . metFORMIN (GLUCOPHAGE) 1000 MG tablet Take 1 tablet (1,000 mg total) by mouth 2 (two) times daily with a meal. 180 tablet 3  . Methylcobalamin (B-12) 5000 MCG TBDP Take by mouth.    . Misc Natural Products (JOINT HEALTH PO) Take 40 mg by mouth.    . Multiple Vitamin (MULTIVITAMIN) tablet Take by mouth.    . Omega-3 Fatty Acids (FISH OIL) 1000 MG CAPS Take by mouth.    Marland Kitchen omeprazole (PRILOSEC) 40 MG capsule     . QC ENTERIC ASPIRIN 325 MG EC tablet   0  . temazepam (RESTORIL) 30 MG capsule TAKE 1 CAPSULE BY MOUTH AT BEDTIME AS NEEDED FOR SLEEP. 30 capsule 1  . Turmeric 450 MG CAPS Take 500 mg by mouth.      No current facility-administered medications for this visit.     Psychiatric Specialty Exam: Review of Systems  Gastrointestinal: Positive for constipation.  Psychiatric/Behavioral: Positive for sleep disturbance.  All other systems reviewed and are negative.   There were no vitals taken for this visit.There is no height or weight on file to calculate BMI.  General Appearance: NA  Eye  Contact:  NA  Speech:  Clear and Coherent and Normal Rate  Volume:  Normal  Mood:  Anxious and Depressed  Affect:  NA  Thought Process:  Goal Directed and Linear  Orientation:  Full (Time, Place, and Person)  Thought Content: Logical   Suicidal Thoughts:  No  Homicidal Thoughts:  No  Memory:  Immediate;   Fair Recent;   Fair Remote;   Good  Judgement:  Good  Insight:  Good  Psychomotor Activity:  NA  Concentration:  Concentration: Fair  Recall:  Fair  Fund of Knowledge: Good  Language: Good  Akathisia:  Negative  Handed:  Right  AIMS (if indicated): not done  Assets:  Communication Skills Desire for Improvement Financial Resources/Insurance Housing Resilience Social Support Talents/Skills  ADL's:  Intact  Cognition: WNL  Sleep:  Good   Screenings: GAD-7     Office Visit from 09/04/2019 in [facility] Office Visit from 06/02/2019 in [facility] Office Visit from 10/04/2018 in [facility]  Total GAD-7 Score  Mini-Mental     [facility]/2019 [facility]  Total Score (max 30 points )  29    PHQ2-9     Office Visit from 09/04/2019 in [facility] Office Visit from 06/02/2019 in [facility] Office Visit from 10/04/2018 in [facility] Office Visit from 08/06/2017 in [facility]  PHQ-2 Total Score  0  0  PHQ-9 Total Score  --  --       Assessment and Plan: 58 yo married male with GAD/MDDwho hasalso been struggling with problems with focusing, forgetfulness since elementary school. He has never been formally diagnosed or treated for ADHD, which he clearly has. We have added Wellbutrin (also for depressed mood)  and Strattera and his focusing has  improved. His mood improved as well after increase of the dose of bupropion SR to 200 mg bid but he would often forget to take afternoon dose. We then changed it to XR 300 mg and he noticed that it does not work as well as previous form did. Less energy, less motivation after the change. He has failed trials of venlafaxine, duloxetine and most recently vortioxetine. The last two he could not tolerate due to nausea/vomiting. He had problems with sleep but reports sleeping well enough after takingRestoril30 mg(earlier tried diazepam also without much benefit). Most recently he has been taking over the counter sleep aid and using temazepam only as a "backup". Mohsen continues to complain aboutpoorshort term memory/forgetfulness. He has had CVA/TIAand family hx of Alzheimer's disease (father). He has been seen by Dr. Royston Sinner (neurologist) and will see him again next month. Labs and brain MRI were not different from ones late last year.Heremains in counseling with Hilbert Odor LCSW every two weeks. He is on long term disability and is looking for a less demanding job; he said the previous one was terminated due to COVID-19.  Dx: Adult ADD; GAD; MDD mild; Cognitive disorder  Plan: We will resume Wellbutrin SR but at a 300 mg daily dose and continue Strattera 80 mg in AM and temazepam30 mg prn sleep.Next visit in three months.The plan was discussed with patient who had an opportunity to ask questions and these were all answered. I spend25 minutes in phone consultation with the patient.     Magdalene Patricia, MD 12/06/2019, 9:14 AM

## 2019-12-21 ENCOUNTER — Other Ambulatory Visit: Payer: Self-pay | Admitting: Podiatry

## 2019-12-21 DIAGNOSIS — G8929 Other chronic pain: Secondary | ICD-10-CM | POA: Diagnosis not present

## 2019-12-21 DIAGNOSIS — Z6828 Body mass index (BMI) 28.0-28.9, adult: Secondary | ICD-10-CM | POA: Diagnosis not present

## 2019-12-21 DIAGNOSIS — I10 Essential (primary) hypertension: Secondary | ICD-10-CM | POA: Diagnosis not present

## 2019-12-21 DIAGNOSIS — M545 Low back pain: Secondary | ICD-10-CM | POA: Diagnosis not present

## 2019-12-26 DIAGNOSIS — M545 Low back pain: Secondary | ICD-10-CM | POA: Diagnosis not present

## 2019-12-28 DIAGNOSIS — M545 Low back pain: Secondary | ICD-10-CM | POA: Diagnosis not present

## 2020-01-03 ENCOUNTER — Ambulatory Visit (HOSPITAL_COMMUNITY): Payer: BC Managed Care – PPO | Admitting: Psychiatry

## 2020-01-17 ENCOUNTER — Other Ambulatory Visit: Payer: Self-pay | Admitting: Internal Medicine

## 2020-01-18 DIAGNOSIS — N411 Chronic prostatitis: Secondary | ICD-10-CM | POA: Diagnosis not present

## 2020-01-22 ENCOUNTER — Encounter (HOSPITAL_COMMUNITY): Payer: Self-pay | Admitting: Psychiatry

## 2020-01-22 ENCOUNTER — Other Ambulatory Visit: Payer: Self-pay

## 2020-01-22 ENCOUNTER — Ambulatory Visit (INDEPENDENT_AMBULATORY_CARE_PROVIDER_SITE_OTHER): Payer: BC Managed Care – PPO | Admitting: Psychiatry

## 2020-01-22 DIAGNOSIS — R413 Other amnesia: Secondary | ICD-10-CM | POA: Diagnosis not present

## 2020-01-22 DIAGNOSIS — F411 Generalized anxiety disorder: Secondary | ICD-10-CM | POA: Diagnosis not present

## 2020-01-22 NOTE — Progress Notes (Signed)
Virtual Visit via Video Note  I connected with [name] on 01/22/20 at  2:00 PM EST by a video enabled telemedicine application and verified that I am speaking with the correct person using two identifiers.  Location: Patient: Patient Home Provider: Home Office   I discussed the limitations of evaluation and management by telemedicine and the availability of in person appointments. The patient expressed understanding and agreed to proceed.  History of Present Illness: MDD and Anxiety due to medical conditions   Treatment Plan Goals: To develop healthy coping skills to use in the workplace to better manage stress and anxiety. Process childhood traumas to allevate psyciatric symptoms of intermittent anxiety and depression.  Observations/Objective: Counselor met with [name] for individual therapy via Webex. Counselor assessed MH symptoms and progress on treatment plan goals, with patient reporting need for discharge from services, as he believes goals to be addressed and met at this time . Michale presents with mild depression and mild anxiety. Tysheem denied suicidal ideation or self-harm behaviors.   Clayten shared that that he was approved for SSDI, which has impacted his anxiety levels in a positive way, alleviating him from financial stress and anxiety of returning to work in his decliniing medical condition. Counselor assessed daily functioning, with Murrel stating that he is attempting to be productive, addressing household projects. He notes that he continues to have negative side effects from medications, including drowsiness, fatigue, dizziness and numbness of limbs while driving. Counselor recommended following up with providers to share symptoms. Jenny reported difficulties with concentration and memory functioning, and that he attempts to apply strategies and skills discussed in session. Counselor processed growth in treatment with Cru noting that he grew in awareness of  the impact of childhood traumas, suppressing feelings by becoming a "workaholic", missing out of connection with his family. He states that he would like to work to repair those relationships and to improve connectedness with his wife. Counselor highlighted progress noted over course of treatment. Dustine expressed need for discharge. Counselor to coordinate care with other providers and gave Kyndall appropriate contact information for resources in the community and to reestablish services if needed. Parks noted being satisfied and appreciative of the services provided by the Counselor.   Assessment and Plan: Counselor will work to discharge patient from services, coordinating care with psychiatrist and other specialists involved in his care. Helen notes that treatment plan goals were achieved. Patient will continue to follow recommendations of providers and implement skills learned in session, as well as follow safety/crisis plan.  Follow Up Instructions: The patient was advised to call back or seek an in-person evaluation if the symptoms worsen or if the condition fails to improve as anticipated.  I provided 40 minutes of non-face-to-face time during this encounter.   [name], LCSW

## 2020-02-29 ENCOUNTER — Ambulatory Visit: Payer: BC Managed Care – PPO | Admitting: Internal Medicine

## 2020-02-29 ENCOUNTER — Other Ambulatory Visit: Payer: Self-pay

## 2020-02-29 ENCOUNTER — Encounter: Payer: Self-pay | Admitting: Internal Medicine

## 2020-02-29 VITALS — BP 128/82 | HR 92 | Temp 98.0°F | Ht 67.0 in | Wt 179.0 lb

## 2020-02-29 DIAGNOSIS — E1140 Type 2 diabetes mellitus with diabetic neuropathy, unspecified: Secondary | ICD-10-CM | POA: Diagnosis not present

## 2020-02-29 LAB — POCT GLYCOSYLATED HEMOGLOBIN (HGB A1C): Hemoglobin A1C: 5.7 % — AB (ref 4.0–5.6)

## 2020-02-29 LAB — CBC
HCT: 44.6 % (ref 39.0–52.0)
Hemoglobin: 14.9 g/dL (ref 13.0–17.0)
MCHC: 33.5 g/dL (ref 30.0–36.0)
MCV: 93.7 fl (ref 78.0–100.0)
Platelets: 239 10*3/uL (ref 150.0–400.0)
RBC: 4.76 Mil/uL (ref 4.22–5.81)
RDW: 13 % (ref 11.5–15.5)
WBC: 7.3 10*3/uL (ref 4.0–10.5)

## 2020-02-29 LAB — COMPREHENSIVE METABOLIC PANEL
ALT: 45 U/L (ref 0–53)
AST: 33 U/L (ref 0–37)
Albumin: 4.5 g/dL (ref 3.5–5.2)
Alkaline Phosphatase: 55 U/L (ref 39–117)
BUN: 25 mg/dL — ABNORMAL HIGH (ref 6–23)
CO2: 29 mEq/L (ref 19–32)
Calcium: 9.7 mg/dL (ref 8.4–10.5)
Chloride: 103 mEq/L (ref 96–112)
Creatinine, Ser: 0.83 mg/dL (ref 0.40–1.50)
GFR: 94.17 mL/min (ref >60.00)
Glucose, Bld: 120 mg/dL — ABNORMAL HIGH (ref 70–99)
Potassium: 4.9 mEq/L (ref 3.5–5.1)
Sodium: 139 mEq/L (ref 135–145)
Total Bilirubin: 0.4 mg/dL (ref 0.2–1.2)
Total Protein: 7.4 g/dL (ref 6.0–8.3)

## 2020-02-29 MED ORDER — METFORMIN HCL 1000 MG PO TABS: 1000.0000 mg | ORAL_TABLET | Freq: Every day | ORAL | 3 refills | Status: DC

## 2020-02-29 NOTE — Progress Notes (Signed)
   Subjective:   Patient ID: Curtis Lam, male    DOB: [birth year], 59 y.o.   MRN: [id number]  HPI The patient is a 59 YO man coming in for follow up diabetes (recently saw podiatry, denies change in diet, still looking for employment s/p stroke, seeing neurology and psych and still struggling with memory and focus). Does have neuropathy which is stable overall. Still struggling with memory and focus issues which are not improving much. This is a big frustration for him.   Review of Systems  Constitutional: Positive for fatigue.  HENT: Negative.   Eyes: Negative.   Respiratory: Negative for cough, chest tightness and shortness of breath.   Cardiovascular: Negative for chest pain, palpitations and leg swelling.  Gastrointestinal: Negative for abdominal distention, abdominal pain, constipation, diarrhea, nausea and vomiting.  Musculoskeletal: Positive for myalgias.  Skin: Negative.   Neurological: Positive for numbness.  Psychiatric/Behavioral: Positive for decreased concentration and dysphoric mood.    Objective:  Physical Exam Constitutional:      Appearance: He is well-developed.  HENT:     Head: Normocephalic and atraumatic.  Cardiovascular:     Rate and Rhythm: Normal rate and regular rhythm.  Pulmonary:     Effort: Pulmonary effort is normal. No respiratory distress.     Breath sounds: Normal breath sounds. No wheezing or rales.  Abdominal:     General: Bowel sounds are normal. There is no distension.     Palpations: Abdomen is soft.     Tenderness: There is no abdominal tenderness. There is no rebound.  Musculoskeletal:     Cervical back: Normal range of motion.  Skin:    General: Skin is warm and dry.  Neurological:     Mental Status: He is alert and oriented to person, place, and time.     Coordination: Coordination normal.     Vitals:   02/29/20 0817  BP: 128/82  Pulse: 92  Temp: 98 F (36.7 C)  TempSrc: Oral  SpO2: 96%  Weight: 179 lb (81.2 kg)   Height: 5\' 7"  (1.702 m)    This visit occurred during the SARS-CoV-2 public health emergency.  Safety protocols were in place, including screening questions prior to the visit, additional usage of staff PPE, and extensive cleaning of exam room while observing appropriate contact time as indicated for disinfecting solutions.   Assessment & Plan:

## 2020-02-29 NOTE — Assessment & Plan Note (Signed)
POC HgA1c today 5.7 which is overtreated. Will reduce metformin to 1000 mg daily. Return in 3 months. Continue gabapentin qhs for neuropathy.

## 2020-02-29 NOTE — Patient Instructions (Signed)
We will have you stop amlodipine (norvasc) for 1 month and let us know if blood pressure goes up.  We will have you decrease metformin to 1 pill daily (instead of 1 pill twice a day).

## 2020-03-05 ENCOUNTER — Other Ambulatory Visit: Payer: Self-pay

## 2020-03-05 ENCOUNTER — Ambulatory Visit (INDEPENDENT_AMBULATORY_CARE_PROVIDER_SITE_OTHER): Payer: BC Managed Care – PPO | Admitting: Psychiatry

## 2020-03-05 DIAGNOSIS — F330 Major depressive disorder, recurrent, mild: Secondary | ICD-10-CM

## 2020-03-05 DIAGNOSIS — F909 Attention-deficit hyperactivity disorder, unspecified type: Secondary | ICD-10-CM

## 2020-03-05 DIAGNOSIS — F411 Generalized anxiety disorder: Secondary | ICD-10-CM

## 2020-03-05 MED ORDER — BUPROPION HCL ER (SR) 150 MG PO TB12: 300.0000 mg | ORAL_TABLET | Freq: Every day | ORAL | 2 refills | Status: DC

## 2020-03-05 MED ORDER — MIRTAZAPINE 15 MG PO TABS: 15.0000 mg | ORAL_TABLET | Freq: Every day | ORAL | 2 refills | Status: DC

## 2020-03-05 MED ORDER — ATOMOXETINE HCL 100 MG PO CAPS: 100.0000 mg | ORAL_CAPSULE | Freq: Every day | ORAL | 2 refills | Status: DC

## 2020-03-05 MED ORDER — TEMAZEPAM 30 MG PO CAPS: ORAL_CAPSULE | ORAL | 1 refills | Status: DC

## 2020-03-05 NOTE — Progress Notes (Signed)
BH MD/PA/NP OP Progress Note  03/05/2020 9:12 AM Curtis Lam  MRN:  [id number] Interview was conducted by phone and I verified that I was speaking with the correct person using two identifiers. I discussed the limitations of evaluation and management by telemedicine and  the availability of in person appointments. Patient expressed understanding and agreed to proceed.  Chief Complaint: Depressed mood, problems with focusing, trask completion.  HPI: 59 yo married male with GAD/MDDwho hasalso been struggling with problems with focusing, forgetfulnesssince elementary school. He has never been formally diagnosed or treated for ADHD, which he clearly has. We have added Wellbutrin (also for depressed mood) and Strattera and his focusing has improved. His mood improvedas wellafter increase of the dose of bupropion SR to 200 mg bid but he would often forget to take afternoon dose. We then changed it to XR 300 mg and he noticed that it does not work as well as previous form did. Less energy, less motivation after the change. He has failed trials of venlafaxine, duloxetine and most recently vortioxetine. The last two he could not tolerate due to nausea/vomiting. He is now back on Wellbutrin SR 300 mg in am. Mood varies but depression has not remitted. He hadproblems with sleep but reports sleeping well enough after takingRestoril30 mg(earlier tried diazepam also without much benefit). Most recently he has been taking over the counter sleep aid and using temazepam only as a "backup". Dagoberto continues to complain aboutpoorshort termmemory/forgetfulness. He has had CVA/TIAand family hx ofAlzheimer's disease (father). He has been seen by Dr. Royston Sinner (neurologist) and labs and brain MRI were not different from ones late last year.He is on long term disability (and SSD) and is looking for a less demanding job; he said the previous one was terminated due to COVID-19.We have added Straterra for  ADD and although there is some improvement he still is reporting problems with concentration/task completion.  Visit Diagnosis:    ICD-10-CM   1. Major depressive disorder, recurrent episode, mild (HCC)  F33.0   2. Generalized anxiety disorder  F41.1   3. Adult ADHD  F90.9     Past Psychiatric History: Please see intake H&P.  Past Medical History:  Past Medical History:  Diagnosis Date  . Anxiety   . Depression   . Diabetes mellitus, type II (HCC)   . Elevated cholesterol   . History of migraine headaches    resolved s/p stopping ibuprofen  . Rectal polyp   . Schizoaffective disorder (HCC)   . 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 COLONOSCOPY  01/02/13  . TONSILLECTOMY    . VASECTOMY      Family Psychiatric History: Reviewed.  Family History:  Family History  Problem Relation Age of Onset  . Alzheimer's disease Father   . Depression Sister   . Diabetes Son 30  . Cancer Paternal Aunt        uterine, ovarian  . Diabetes Maternal Grandfather   . Heart disease Maternal Grandfather     Social History:  Social History   Socioeconomic History  . Marital status: Married    Spouse name: Not on file  . Number of children: 2  . Years of education: Not on file  . Highest education level: High school graduate  Occupational History  . Occupation: Engineer, mining)    Employer: [employer]  Tobacco Use  . Smoking status: Never Smoker  . Smokeless tobacco:  Never Used  Substance and Sexual Activity  . Alcohol use: Yes    Alcohol/week: 1.0 standard drinks    Types: 1 Cans of beer per week    Comment: 1 beer or wine 2-3 times per month, more in summer than winter  . Drug use: No  . Sexual activity: Yes    Partners: Female  Other Topics Concern  . Not on file  Social History Narrative   Lives with wife and son.  Other son lives in [place].  Wife suffers from depression, recent flare and  hospitalization (12/2013); He lost his job 09/2014 for 4-5 weeks, and got new job   Social Determinants of Corporate investment banker Strain:   . Difficulty of Paying Living Expenses:   Food Insecurity:   . Worried About Programme researcher, broadcasting/film/video in the Last Year:   . Barista in the Last Year:   Transportation Needs:   . Freight forwarder (Medical):   Marland Kitchen Lack of Transportation (Non-Medical):   Physical Activity:   . Days of Exercise per Week:   . Minutes of Exercise per Session:   Stress:   . Feeling of Stress :   Social Connections:   . Frequency of Communication with Friends and Family:   . Frequency of Social Gatherings with Friends and Family:   . Attends Religious Services:   . Active Member of Clubs or Organizations:   . Attends Banker Meetings:   Marland Kitchen Marital Status:     Allergies: No Known Allergies  Metabolic Disorder Labs: Lab Results  Component Value Date   HGBA1C 5.7 (A) 02/29/2020   MPG 126 (H) 01/23/2015   MPG 120 (H) 12/25/2013   No results found for: PROLACTIN Lab Results  Component Value Date   CHOL 97 06/02/2019   TRIG 128.0 06/02/2019   HDL 39.70 06/02/2019   CHOLHDL 2 06/02/2019   VLDL 25.6 06/02/2019   LDLCALC 32 06/02/2019   LDLCALC 110 (H) 08/11/2017   Lab Results  Component Value Date   TSH 2.589 01/23/2015   TSH 2.765 12/25/2013    Therapeutic Level Labs: No results found for: LITHIUM No results found for: VALPROATE No components found for:  CBMZ  Current Medications: Current Outpatient Medications  Medication Sig Dispense Refill  . amLODipine (NORVASC) 10 MG tablet TAKE 1 TABLET (10 MG TOTAL) BY MOUTH DAILY. 90 tablet 3  . atomoxetine (STRATTERA) 100 MG capsule Take 1 capsule (100 mg total) by mouth daily. 30 capsule 2  . atorvastatin (LIPITOR) 40 MG tablet TAKE 1 TABLET BY MOUTH DAILY AT 6 PM. 90 tablet 1  . buPROPion (WELLBUTRIN SR) 150 MG 12 hr tablet Take 2 tablets (300 mg total) by mouth daily. 60 tablet 2   . Cascara Sagrada 450 MG CAPS Take by mouth.    . cholecalciferol (VITAMIN D3) 25 MCG (1000 UT) tablet Take by mouth.    . ciclopirox (PENLAC) 8 % solution Apply topically at bedtime. Apply over nail and surrounding skin. Apply daily over previous coat. After seven (7) days, may remove with alcohol and continue cycle. 6.6 mL 4  . diclofenac sodium (VOLTAREN) 1 % GEL Apply 2 g topically 4 (four) times daily. Rub into affected area of foot 2 to 4 times daily 100 g 2  . gabapentin (NEURONTIN) 100 MG capsule TAKE 1 CAPSULE BY MOUTH AT BEDTIME 90 capsule 0  . glucose blood (BAYER CONTOUR TEST) test strip 1 each by Other route daily. Use  as instructed.  Dx: E11.69 100 each 12  . losartan (COZAAR) 25 MG tablet Take 1 tablet (25 mg total) by mouth daily. 90 tablet 3  . metFORMIN (GLUCOPHAGE) 1000 MG tablet Take 1 tablet (1,000 mg total) by mouth daily with breakfast. 90 tablet 3  . Methylcobalamin (B-12) 5000 MCG TBDP Take by mouth.    . mirtazapine (REMERON) 15 MG tablet Take 1 tablet (15 mg total) by mouth at bedtime. 30 tablet 2  . Misc Natural Products (JOINT HEALTH PO) Take 40 mg by mouth.    . Multiple Vitamin (MULTIVITAMIN) tablet Take by mouth.    . Omega-3 Fatty Acids (FISH OIL) 1000 MG CAPS Take by mouth.    Marland Kitchen omeprazole (PRILOSEC) 40 MG capsule     . QC ENTERIC ASPIRIN 325 MG EC tablet   0  . temazepam (RESTORIL) 30 MG capsule TAKE 1 CAPSULE BY MOUTH AT BEDTIME AS NEEDED FOR SLEEP. 30 capsule 1  . Turmeric 450 MG CAPS Take 500 mg by mouth.      No current facility-administered medications for this visit.    Psychiatric Specialty Exam: Review of Systems  Psychiatric/Behavioral: Positive for decreased concentration and sleep disturbance. The patient is nervous/anxious.   All other systems reviewed and are negative.   There were no vitals taken for this visit.There is no height or weight on file to calculate BMI.  General Appearance: NA  Eye Contact:  NA  Speech:  Clear and Coherent  and Normal Rate  Volume:  Normal  Mood:  Anxious and Depressed  Affect:  NA  Thought Process:  Goal Directed and Linear  Orientation:  Full (Time, Place, and Person)  Thought Content: Logical   Suicidal Thoughts:  No  Homicidal Thoughts:  No  Memory:  Immediate;   Fair Recent;   Good Remote;   Good  Judgement:  Good  Insight:  Good  Psychomotor Activity:  NA  Concentration:  Concentration: Fair  Recall:  [place] of Knowledge: Good  Language: Good  Akathisia:  Negative  Handed:  Right  AIMS (if indicated): not done  Assets:  Communication Skills Desire for Improvement Housing Resilience Social Support  ADL's:  Intact  Cognition: WNL  Sleep:  Fair   Screenings: GAD-7     Office Visit from 09/04/2019 in [place] Office Visit from 06/02/2019 in [place] Visit from 10/04/2018 in [place]  Total GAD-7 Score  11  8  3     Mini-Mental     Office Visit from 01/20/2019 in [facility]  Total Score (max 30 points )  29    PHQ2-9     Office Visit from 02/29/2020 in [facility] Visit from 09/04/2019 in [place] Visit from 06/02/2019 in [place] from 10/04/2018 in [place] from 08/06/2017 in [place]  PHQ-2 Total Score  5  3  4  2   0  PHQ-9 Total Score  18  12  13  8   -       Assessment and Plan:  59 yo married male with GAD/MDDwho hasalso been struggling with problems with focusing, forgetfulnesssince elementary school. He has never been formally diagnosed or treated for ADHD, which he clearly has. We have added Wellbutrin (also for depressed mood) and Strattera and his focusing has improved.  His mood improvedas wellafter increase of the dose of bupropion SR to 200 mg bid but  he would often forget to take afternoon dose. We then changed it to XR 300 mg and he noticed that it does not work as well as previous form did. Less energy, less motivation after the change. He has failed trials of venlafaxine, duloxetine and most recently vortioxetine. The last two he could not tolerate due to nausea/vomiting. He is now back on Wellbutrin SR 300 mg in am. Mood varies but depression has not remitted. He hadproblems with sleep but reports sleeping well enough after takingRestoril30 mg(earlier tried diazepam also without much benefit). Most recently he has been taking over the counter sleep aid and using temazepam only as a "backup". Brien continues to complain aboutpoorshort termmemory/forgetfulness. He has had CVA/TIAand family hx ofAlzheimer's disease (father). He has been seen by Dr. Royston Sinner (neurologist) and labs and brain MRI were not different from ones late last year.He is on long term disability (and SSD) and is looking for a less demanding job; he said the previous one was terminated due to COVID-19.We have added Straterra for ADD and although there is some improvement he still is reporting problems with concentration/task completion.  Dx: Adult ADD; GAD; MDD mild to moderate;  Plan: We will continue Wellbutrin SR 300 mg daily dose, temazepam 30 mg prn insomnia, increase Strattera to 100 mg in AM and add mirtazapine 15 mg at HS for both sleep and depression. He also needs long term disability forms to be updated (for his ex-employer plan). Next visit in6 weeks.The plan was discussed with patient who had an opportunity to ask questions and these were all answered. I spend20 minutes in phone consultation with the patient.    Magdalene Patricia, MD 03/05/2020, 9:12 AM

## 2020-03-18 ENCOUNTER — Encounter: Payer: Self-pay | Admitting: Internal Medicine

## 2020-03-23 ENCOUNTER — Ambulatory Visit: Payer: BC Managed Care – PPO | Attending: Internal Medicine

## 2020-03-23 DIAGNOSIS — Z23 Encounter for immunization: Secondary | ICD-10-CM

## 2020-03-23 NOTE — Progress Notes (Signed)
   Covid-19 Vaccination Clinic  Name:  Curtis Lam    MRN: [id number] DOB: 1961-08-16  03/23/2020  Curtis Lam was observed post Covid-19 immunization for 15 minutes without incident. He was provided with Vaccine Information Sheet and instruction to access the V-Safe system.   Curtis Lam was instructed to call 911 with any severe reactions post vaccine: Marland Kitchen Difficulty breathing  . Swelling of face and throat  . A fast heartbeat  . A bad rash all over body  . Dizziness and weakness   Immunizations Administered    Name Date Dose VIS Date Route   Pfizer COVID-19 Vaccine 03/23/2020  9:44 AM 0.3 mL 12/01/2019 Intramuscular   Manufacturer: [employer], [employer]   Lot: [id number]   NDC: [address]-2

## 2020-04-15 ENCOUNTER — Telehealth (INDEPENDENT_AMBULATORY_CARE_PROVIDER_SITE_OTHER): Payer: BC Managed Care – PPO | Admitting: Psychiatry

## 2020-04-15 ENCOUNTER — Other Ambulatory Visit: Payer: Self-pay

## 2020-04-15 DIAGNOSIS — F411 Generalized anxiety disorder: Secondary | ICD-10-CM

## 2020-04-15 DIAGNOSIS — F330 Major depressive disorder, recurrent, mild: Secondary | ICD-10-CM | POA: Diagnosis not present

## 2020-04-15 DIAGNOSIS — F909 Attention-deficit hyperactivity disorder, unspecified type: Secondary | ICD-10-CM

## 2020-04-15 MED ORDER — BUPROPION HCL ER (SR) 150 MG PO TB12: 300.0000 mg | ORAL_TABLET | Freq: Every day | ORAL | 2 refills | Status: DC

## 2020-04-15 MED ORDER — TEMAZEPAM 30 MG PO CAPS: ORAL_CAPSULE | ORAL | 2 refills | Status: DC

## 2020-04-15 MED ORDER — ATOMOXETINE HCL 100 MG PO CAPS: 100.0000 mg | ORAL_CAPSULE | Freq: Every day | ORAL | 0 refills | Status: DC

## 2020-04-15 MED ORDER — MIRTAZAPINE 15 MG PO TABS: 15.0000 mg | ORAL_TABLET | Freq: Every day | ORAL | 0 refills | Status: DC

## 2020-04-15 NOTE — Progress Notes (Signed)
BH MD/PA/NP OP Progress Note  04/15/2020 9:15 AM Curtis Lam  MRN:  [id number] Interview was conducted by phone and I verified that I was speaking with the correct person using two identifiers. I discussed the limitations of evaluation and management by telemedicine and  the availability of in person appointments. Patient expressed understanding and agreed to proceed.  Chief Complaint: "Emnotionally I am in a good place".  HPI: 59 yo married male with GAD/MDDwho hasalso been struggling with problems with focusing, forgetfulnesssince elementary school. He has never been formally diagnosed or treated for ADHD, which he clearly has. We have added Wellbutrin (also for depressed mood) and Strattera and his focusing has improved. His mood improvedas wellafter increase of the dose of bupropion SR to 200 mg bidbut he would often forget to take afternoon dose. We then changed it to XR 300 mg and he noticed that it does not work as well as previous form did. Less energy, less motivation after the change.He has failed trials of venlafaxine, duloxetine and most recently vortioxetine. The last two he could not tolerate due to nausea/vomiting. He is now back on Wellbutrin SR 300 mg in am. Mood varies but depression has not remitted. He hadproblems with sleep but reports sleeping well enough after takingRestoril30 mg(earlier tried diazepam also without much benefit).Most recently he has been taking over the counter sleep aid and using temazepam only as a "backup". Curtis Lam continues to complain aboutpoorshort termmemory/forgetfulness. He has had CVA/TIAand family hx ofAlzheimer's disease (father). He has been seen by Dr. Royston Lam (neurologist)and labs and brain MRI were not different from ones late last year.He is on long term disability (and SSD) and and has decided not to look for another job out of concern that his ongoing problems with STM will make it impossible to carry out well; he said  theprevious one was terminated due to COVID-19.We have added Straterra for ADD and although there is some improvement he still is reporting problems with memory.  Visit Diagnosis:    ICD-10-CM   1. Generalized anxiety disorder  F41.1   2. Adult ADHD  F90.9   3. Major depressive disorder, recurrent episode, mild (HCC)  F33.0     Past Psychiatric History: Please see intake H&P.  Past Medical History:  Past Medical History:  Diagnosis Date  . Anxiety   . Depression   . Diabetes mellitus, type II (HCC)   . Elevated cholesterol   . History of migraine headaches    resolved s/p stopping ibuprofen  . Rectal polyp   . Schizoaffective disorder (HCC)   . 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 COLONOSCOPY  01/02/13  . TONSILLECTOMY    . VASECTOMY      Family Psychiatric History: Reviewed.  Family History:  Family History  Problem Relation Age of Onset  . Alzheimer's disease Father   . Depression Sister   . Diabetes Son 30  . Cancer Paternal Aunt        uterine, ovarian  . Diabetes Maternal Grandfather   . Heart disease Maternal Grandfather     Social History:  Social History   Socioeconomic History  . Marital status: Married    Spouse name: Not on file  . Number of children: 2  . Years of education: Not on file  . Highest education level: High school graduate  Occupational History  . Occupation: Engineer, mining)    Employer: [employer]  Tobacco Use  . Smoking status: Never Smoker  . Smokeless tobacco: Never Used  Substance and Sexual Activity  . Alcohol use: Yes    Alcohol/week: 1.0 standard drinks    Types: 1 Cans of beer per week    Comment: 1 beer or wine 2-3 times per month, more in summer than winter  . Drug use: No  . Sexual activity: Yes    Partners: Female  Other Topics Concern  . Not on file  Social History Narrative   Lives with wife and son.  Other son lives in [place].   Wife suffers from depression, recent flare and hospitalization (12/2013); He lost his job 09/2014 for 4-5 weeks, and got new job   Social Determinants of Corporate investment banker Strain:   . Difficulty of Paying Living Expenses:   Food Insecurity:   . Worried About Programme researcher, broadcasting/film/video in the Last Year:   . Barista in the Last Year:   Transportation Needs:   . Freight forwarder (Medical):   Marland Kitchen Lack of Transportation (Non-Medical):   Physical Activity:   . Days of Exercise per Week:   . Minutes of Exercise per Session:   Stress:   . Feeling of Stress :   Social Connections:   . Frequency of Communication with Friends and Family:   . Frequency of Social Gatherings with Friends and Family:   . Attends Religious Services:   . Active Member of Clubs or Organizations:   . Attends Banker Meetings:   Marland Kitchen Marital Status:     Allergies: No Known Allergies  Metabolic Disorder Labs: Lab Results  Component Value Date   HGBA1C 5.7 (A) 02/29/2020   MPG 126 (H) 01/23/2015   MPG 120 (H) 12/25/2013   No results found for: PROLACTIN Lab Results  Component Value Date   CHOL 97 06/02/2019   TRIG 128.0 06/02/2019   HDL 39.70 06/02/2019   CHOLHDL 2 06/02/2019   VLDL 25.6 06/02/2019   LDLCALC 32 06/02/2019   LDLCALC 110 (H) 08/11/2017   Lab Results  Component Value Date   TSH 2.589 01/23/2015   TSH 2.765 12/25/2013    Therapeutic Level Labs: No results found for: LITHIUM No results found for: VALPROATE No components found for:  CBMZ  Current Medications: Current Outpatient Medications  Medication Sig Dispense Refill  . amLODipine (NORVASC) 10 MG tablet TAKE 1 TABLET (10 MG TOTAL) BY MOUTH DAILY. 90 tablet 3  . atomoxetine (STRATTERA) 100 MG capsule Take 1 capsule (100 mg total) by mouth daily. 90 capsule 0  . atorvastatin (LIPITOR) 40 MG tablet TAKE 1 TABLET BY MOUTH DAILY AT 6 PM. 90 tablet 1  . buPROPion (WELLBUTRIN SR) 150 MG 12 hr tablet Take 2  tablets (300 mg total) by mouth daily. 60 tablet 2  . Cascara Sagrada 450 MG CAPS Take by mouth.    . cholecalciferol (VITAMIN D3) 25 MCG (1000 UT) tablet Take by mouth.    . ciclopirox (PENLAC) 8 % solution Apply topically at bedtime. Apply over nail and surrounding skin. Apply daily over previous coat. After seven (7) days, may remove with alcohol and continue cycle. 6.6 mL 4  . diclofenac sodium (VOLTAREN) 1 % GEL Apply 2 g topically 4 (four) times daily. Rub into affected area of foot 2 to 4 times daily 100 g 2  . gabapentin (NEURONTIN) 100 MG capsule TAKE 1 CAPSULE BY MOUTH AT BEDTIME 90 capsule 0  . glucose blood (  BAYER CONTOUR TEST) test strip 1 each by Other route daily. Use as instructed.  Dx: E11.69 100 each 12  . losartan (COZAAR) 25 MG tablet Take 1 tablet (25 mg total) by mouth daily. 90 tablet 3  . metFORMIN (GLUCOPHAGE) 1000 MG tablet Take 1 tablet (1,000 mg total) by mouth daily with breakfast. 90 tablet 3  . Methylcobalamin (B-12) 5000 MCG TBDP Take by mouth.    . mirtazapine (REMERON) 15 MG tablet Take 1 tablet (15 mg total) by mouth at bedtime. 90 tablet 0  . Misc Natural Products (JOINT HEALTH PO) Take 40 mg by mouth.    . Multiple Vitamin (MULTIVITAMIN) tablet Take by mouth.    . Omega-3 Fatty Acids (FISH OIL) 1000 MG CAPS Take by mouth.    Marland Kitchen omeprazole (PRILOSEC) 40 MG capsule     . QC ENTERIC ASPIRIN 325 MG EC tablet   0  . temazepam (RESTORIL) 30 MG capsule TAKE 1 CAPSULE BY MOUTH AT BEDTIME AS NEEDED FOR SLEEP. 30 capsule 2  . Turmeric 450 MG CAPS Take 500 mg by mouth.      No current facility-administered medications for this visit.    Psychiatric Specialty Exam: Review of Systems  Psychiatric/Behavioral: Positive for decreased concentration and sleep disturbance. The patient is nervous/anxious.   All other systems reviewed and are negative.   There were no vitals taken for this visit.There is no height or weight on file to calculate BMI.  General Appearance: NA   Eye Contact:  NA  Speech:  Clear and Coherent and Normal Rate  Volume:  Normal  Mood:  MInimally depressed and anxious.  Affect:  NA  Thought Process:  Goal Directed and Linear  Orientation:  Full (Time, Place, and Person)  Thought Content: Logical   Suicidal Thoughts:  No  Homicidal Thoughts:  No  Memory:  Immediate;   Poor Recent;   Fair Remote;   Good  Judgement:  Good  Insight:  Good  Psychomotor Activity:  NA  Concentration:  Concentration: Fair  Recall:  Fair  Fund of Knowledge: Good  Language: Good  Akathisia:  Negative  Handed:  Right  AIMS (if indicated): not done  Assets:  Communication Skills Desire for Improvement Housing Resilience Social Support  ADL's:  Intact  Cognition: WNL  Sleep:  Fair   Screenings: GAD-7     Office Visit from 09/04/2019 in [facility] Office Visit from 06/02/2019 in [facility] Office Visit from 10/04/2018 in [facility]  Total GAD-7 Score  11  8  3     Mini-Mental     [facility]/20/2019 [facility]  Total Score (max 30 points )  29    PHQ2-9     Office Visit from 02/29/2020 in [facility] Visit from 09/04/2019 in [facility] Office Visit from 06/02/2019 in [facility] Office Visit from 10/04/2018 in [facility] Office Visit from 08/06/2017 in [facility]  PHQ-2 Total Score  5  3  4  2   0  PHQ-9 Total Score  18  12  13  8   --       Assessment and Plan: 59 yo married male with GAD/MDDwho hasalso been struggling with problems with focusing, forgetfulnesssince elementary school. He has never been formally diagnosed or treated for ADHD, which he clearly has. We have added  Wellbutrin (also for depressed mood) and Strattera and his focusing has improved. His mood improvedas  wellafter increase of the dose of bupropion SR to 200 mg bidbut he would often forget to take afternoon dose. We then changed it to XR 300 mg and he noticed that it does not work as well as previous form did. Less energy, less motivation after the change.He has failed trials of venlafaxine, duloxetine and most recently vortioxetine. The last two he could not tolerate due to nausea/vomiting. He is now back on Wellbutrin SR 300 mg in am. Mood varies but depression has not remitted. He hadproblems with sleep but reports sleeping well enough after takingRestoril30 mg(earlier tried diazepam also without much benefit).Most recently he has been taking over the counter sleep aid and using temazepam only as a "backup". Jomar continues to complain aboutpoorshort termmemory/forgetfulness. He has had CVA/TIAand family hx ofAlzheimer's disease (father). He has been seen by Dr. Laurena Slimmer (neurologist)and labs and brain MRI were not different from ones late last year.He is on long term disability (and SSD) and and has decided not to look for another job out of concern that his ongoing problems with STM will make it impossible to carry out well; he said theprevious one was terminated due to COVID-19.We have added Straterra for ADD and although there is some improvement he still is reporting problems with memory.  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 Next visit in3 months.The plan was discussed with patient who had an opportunity to ask questions and these were all answered. I spend20 minutes in phone consultation with the patient.    Stephanie Acre, MD 04/15/2020, 9:15 AM

## 2020-04-16 ENCOUNTER — Ambulatory Visit: Payer: BC Managed Care – PPO | Attending: Internal Medicine

## 2020-04-16 DIAGNOSIS — Z23 Encounter for immunization: Secondary | ICD-10-CM

## 2020-04-16 NOTE — Progress Notes (Signed)
   Covid-19 Vaccination Clinic  Name:  Curtis Lam    MRN: [id number] DOB: 10/15/1961  04/16/2020  [name] was observed post Covid-19 immunization for 15 minutes without incident. He was provided with Vaccine Information Sheet and instruction to access the V-Safe system.   [name] was instructed to call 911 with any severe reactions post vaccine: Marland Kitchen Difficulty breathing  . Swelling of face and throat  . A fast heartbeat  . A bad rash all over body  . Dizziness and weakness   Immunizations Administered    Name Date Dose VIS Date Route   Pfizer COVID-19 Vaccine 04/16/2020  8:49 AM 0.3 mL 02/14/2019 Intramuscular   Manufacturer: [employer], [employer]   Lot: [id number]   NDC: [address]-5

## 2020-04-29 ENCOUNTER — Encounter: Payer: Self-pay | Admitting: Podiatry

## 2020-04-29 ENCOUNTER — Ambulatory Visit: Payer: BC Managed Care – PPO | Admitting: Podiatry

## 2020-04-29 ENCOUNTER — Other Ambulatory Visit: Payer: Self-pay

## 2020-04-29 DIAGNOSIS — E1149 Type 2 diabetes mellitus with other diabetic neurological complication: Secondary | ICD-10-CM | POA: Diagnosis not present

## 2020-04-29 DIAGNOSIS — M722 Plantar fascial fibromatosis: Secondary | ICD-10-CM

## 2020-04-29 DIAGNOSIS — B351 Tinea unguium: Secondary | ICD-10-CM

## 2020-04-30 NOTE — Progress Notes (Signed)
Subjective: 59 year old male presents the office today for follow-up evaluation of neuropathy, currently taking gabapentin 100 mg at nighttime which she has been doing well.  He has also nails are doing well recently no pain to the foot. Plantar fascial symptoms have resolved.  Denies any open sores or any changes since I last saw him. Denies any systemic complaints such as fevers, chills, nausea, vomiting. No acute changes since last appointment, and no other complaints at this time.   Last A1c 5.7 on 02/29/2020 this is down from a nine 1 year ago  Objective: AAO x3, NAD DP/PT pulses palpable bilaterally, CRT less than 3 seconds No significant tenderness identified bilateral lower extremities there is no edema, erythema.  Overall the toenails appear to be much more clear in color.  There is no pain in the nails there is no redness or drainage or signs of infection.  No open lesions or pre-ulcerative lesions.  No pain with calf compression, swelling, warmth, erythema  Assessment: Neuropathy, onychomycosis with resolved plantar fasciitis  Plan: -All treatment options discussed with the patient including all alternatives, risks, complications.  -Overall doing better.  Continue the same dose of gabapentin. -Discussed daily foot inspection. -Patient encouraged to call the office with any questions, concerns, change in symptoms.   Return in about 6 months (around 10/30/2020).

## 2020-05-07 ENCOUNTER — Other Ambulatory Visit: Payer: Self-pay | Admitting: Podiatry

## 2020-05-15 ENCOUNTER — Other Ambulatory Visit: Payer: Self-pay

## 2020-05-15 MED ORDER — ATORVASTATIN CALCIUM 40 MG PO TABS: ORAL_TABLET | ORAL | 1 refills | Status: DC

## 2020-05-21 ENCOUNTER — Other Ambulatory Visit: Payer: Self-pay | Admitting: Podiatry

## 2020-05-27 ENCOUNTER — Encounter: Payer: Self-pay | Admitting: Internal Medicine

## 2020-05-27 ENCOUNTER — Other Ambulatory Visit: Payer: Self-pay

## 2020-05-27 ENCOUNTER — Ambulatory Visit (INDEPENDENT_AMBULATORY_CARE_PROVIDER_SITE_OTHER): Payer: Self-pay | Admitting: Internal Medicine

## 2020-05-27 VITALS — BP 148/96 | HR 88 | Temp 98.3°F | Ht 67.0 in | Wt 180.0 lb

## 2020-05-27 DIAGNOSIS — I10 Essential (primary) hypertension: Secondary | ICD-10-CM

## 2020-05-27 DIAGNOSIS — E1169 Type 2 diabetes mellitus with other specified complication: Secondary | ICD-10-CM

## 2020-05-27 LAB — HEMOGLOBIN A1C: Hgb A1c MFr Bld: 6.3 % (ref 4.6–6.5)

## 2020-05-27 NOTE — Assessment & Plan Note (Signed)
Checking HgA1c and adjust as needed. Currently taking 500 mg daily. Is on ARB and statin. Sounds to be having some low blood sugars or low BP he cannot recall which.

## 2020-05-27 NOTE — Patient Instructions (Signed)
We will check the labs today. 

## 2020-05-27 NOTE — Progress Notes (Signed)
° °  Subjective:   Patient ID: Curtis Lam, male    DOB: [birth year], 59 y.o.   MRN: [id number]  HPI The patient is a 59 YO man coming in for follow up diabetes. Taking 1/2 metformin daily due to some concern for low sugars. He is still having poor appetite and forces himself to eat. Checks sugars whenever he gets dizziness or other symptoms. Did not bring meter with him today.   Review of Systems  Constitutional: Negative.   HENT: Negative.   Eyes: Negative.   Respiratory: Negative for cough, chest tightness and shortness of breath.   Cardiovascular: Negative for chest pain, palpitations and leg swelling.  Gastrointestinal: Negative for abdominal distention, abdominal pain, constipation, diarrhea, nausea and vomiting.  Musculoskeletal: Negative.   Skin: Negative.   Neurological: Positive for dizziness and light-headedness.  Psychiatric/Behavioral: Positive for decreased concentration and dysphoric mood. The patient is nervous/anxious.     Objective:  Physical Exam Constitutional:      Appearance: He is well-developed.  HENT:     Head: Normocephalic and atraumatic.  Cardiovascular:     Rate and Rhythm: Normal rate and regular rhythm.  Pulmonary:     Effort: Pulmonary effort is normal. No respiratory distress.     Breath sounds: Normal breath sounds. No wheezing or rales.  Abdominal:     General: Bowel sounds are normal. There is no distension.     Palpations: Abdomen is soft.     Tenderness: There is no abdominal tenderness. There is no rebound.  Musculoskeletal:     Cervical back: Normal range of motion.  Skin:    General: Skin is warm and dry.  Neurological:     Mental Status: He is alert and oriented to person, place, and time.     Coordination: Coordination normal.    Vitals:   05/27/20 0828  BP: (!) 148/96  Pulse: 88  Temp: 98.3 F (36.8 C)  TempSrc: Oral  SpO2: 98%  Weight: 180 lb (81.6 kg)  Height: 5\' 7"  (1.702 m)   This visit occurred during the  SARS-CoV-2 public health emergency.  Safety protocols were in place, including screening questions prior to the visit, additional usage of staff PPE, and extensive cleaning of exam room while observing appropriate contact time as indicated for disinfecting solutions.   Assessment & Plan:

## 2020-05-27 NOTE — Assessment & Plan Note (Signed)
BP is still variable and lowest reading 95/55 and highest at home 180s/90s. Today is slightly above goal but will not adjust. Keep losartan 25 mg daily.

## 2020-06-10 ENCOUNTER — Encounter: Payer: Self-pay | Admitting: Internal Medicine

## 2020-06-19 ENCOUNTER — Encounter: Payer: Self-pay | Admitting: Internal Medicine

## 2020-06-21 ENCOUNTER — Encounter: Payer: Self-pay | Admitting: Internal Medicine

## 2020-06-21 MED ORDER — LOSARTAN POTASSIUM 25 MG PO TABS: 25.0000 mg | ORAL_TABLET | Freq: Every day | ORAL | 1 refills | Status: DC

## 2020-07-15 ENCOUNTER — Other Ambulatory Visit: Payer: Self-pay

## 2020-07-15 ENCOUNTER — Telehealth (INDEPENDENT_AMBULATORY_CARE_PROVIDER_SITE_OTHER): Payer: BC Managed Care – PPO | Admitting: Psychiatry

## 2020-07-15 DIAGNOSIS — F909 Attention-deficit hyperactivity disorder, unspecified type: Secondary | ICD-10-CM

## 2020-07-15 DIAGNOSIS — F330 Major depressive disorder, recurrent, mild: Secondary | ICD-10-CM

## 2020-07-15 DIAGNOSIS — F411 Generalized anxiety disorder: Secondary | ICD-10-CM

## 2020-07-15 MED ORDER — MIRTAZAPINE 15 MG PO TABS: 15.0000 mg | ORAL_TABLET | Freq: Every day | ORAL | 1 refills | Status: DC

## 2020-07-15 MED ORDER — ATOMOXETINE HCL 100 MG PO CAPS: 100.0000 mg | ORAL_CAPSULE | Freq: Every day | ORAL | 1 refills | Status: DC

## 2020-07-15 MED ORDER — TEMAZEPAM 30 MG PO CAPS: ORAL_CAPSULE | ORAL | 2 refills | Status: DC

## 2020-07-15 MED ORDER — BUPROPION HCL ER (SR) 150 MG PO TB12: 300.0000 mg | ORAL_TABLET | Freq: Every day | ORAL | 5 refills | Status: DC

## 2020-07-15 NOTE — Progress Notes (Signed)
BH MD/PA/NP OP Progress Note  07/15/2020 9:15 AM Curtis Lam  MRN:  [id number] Interview was conducted by phone and I verified that I was speaking with the correct person using two identifiers. I discussed the limitations of evaluation and management by telemedicine and  the availability of in person appointments. Patient expressed understanding and agreed to proceed. Patient location - home; physician - home office.  Chief Complaint: "I am OK I guess".  HPI: 59yo married male with GAD/MDDwho hasalso been struggling with problems with focusing, forgetfulnesssince elementary school. He has never been formally diagnosed or treated for ADHD, which he clearly has. We have added Wellbutrin (also for depressed mood) and Strattera and his focusing has improved. His mood improvedas wellafter increase of the dose of bupropion SR to 200 mg bidbut he would often forget to take afternoon dose. We then changed it to XR 300 mg and he noticed that it does not work as well as previous form did. Less energy, less motivation after the change.He has failed trials of venlafaxine, duloxetine and most recently vortioxetine. The last two he could not tolerate due to nausea/vomiting.He is now back on Wellbutrin SR 300 mg in am. Mood varies but depression has not remitted.He hadproblems with sleep but reports sleeping well enough after takingRestoril30 mg(earlier tried diazepam also without much benefit).Most recently he has been taking over the counter sleep aid and using temazepam only as a "backup" 1-2 x a week. Casimiro NeedleMichael continues to complain aboutpoorshort termmemory/forgetfulness. He has had CVA/TIAand family hx ofAlzheimer's disease (father). He has been seen by Dr. Royston SinnerJoseph Miller (neurologist)andlabs and brain MRI were not different from ones late last year.He is on long term disability(and SSD)and and has decided not to look for another job out of concern that his ongoing problems with STM will  make it impossible to carry out well; he said theprevious one was terminated due to COVID-19.  Visit Diagnosis:    ICD-10-CM   1. Adult ADHD  F90.9   2. Generalized anxiety disorder  F41.1   3. Major depressive disorder, recurrent episode, mild (HCC)  F33.0     Past Psychiatric History: Please see intake H&P.  Past Medical History:  Past Medical History:  Diagnosis Date  . Anxiety   . Depression   . Diabetes mellitus, type II (HCC)   . Elevated cholesterol   . History of migraine headaches    resolved s/p stopping ibuprofen  . Rectal polyp   . Schizoaffective disorder (HCC)   . 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 COLONOSCOPY  01/02/13  . TONSILLECTOMY    . VASECTOMY      Family Psychiatric History: Reviewed.  Family History:  Family History  Problem Relation Age of Onset  . Alzheimer's disease Father   . Depression Sister   . Diabetes Son 30  . Cancer Paternal Aunt        uterine, ovarian  . Diabetes Maternal Grandfather   . Heart disease Maternal Grandfather     Social History:  Social History   Socioeconomic History  . Marital status: Married    Spouse name: Not on file  . Number of children: 2  . Years of education: Not on file  . Highest education level: High school graduate  Occupational History  . 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 Smoking status: Never Smoker  . Smokeless tobacco: Never  Used  Vaping Use  . Vaping Use: Never used  Substance and Sexual Activity  . Alcohol use: Yes    Alcohol/week: 1.0 standard drink    Types: 1 Cans of beer per week    Comment: 1 beer or wine 2-3 times per month, more in summer than winter  . Drug use: No  . Sexual activity: Yes    Partners: Female  Other Topics Concern  . Not on file  Social History Narrative   Lives with wife and son.  Other son lives in [place].  Wife suffers from depression,  recent flare and hospitalization (12/2013); He lost his job 09/2014 for 4-5 weeks, and got new job   Social Determinants of Corporate investment banker Strain:   . Difficulty of Paying Living Expenses:   Food Insecurity:   . Worried About Programme researcher, broadcasting/film/video in the Last Year:   . Barista in the Last Year:   Transportation Needs:   . Freight forwarder (Medical):   Marland Kitchen Lack of Transportation (Non-Medical):   Physical Activity:   . Days of Exercise per Week:   . Minutes of Exercise per Session:   Stress:   . Feeling of Stress :   Social Connections:   . Frequency of Communication with Friends and Family:   . Frequency of Social Gatherings with Friends and Family:   . Attends Religious Services:   . Active Member of Clubs or Organizations:   . Attends Banker Meetings:   Marland Kitchen Marital Status:     Allergies: No Known Allergies  Metabolic Disorder Labs: Lab Results  Component Value Date   HGBA1C 6.3 05/27/2020   MPG 126 (H) 01/23/2015   MPG 120 (H) 12/25/2013   No results found for: PROLACTIN Lab Results  Component Value Date   CHOL 97 06/02/2019   TRIG 128.0 06/02/2019   HDL 39.70 06/02/2019   CHOLHDL 2 06/02/2019   VLDL 25.6 06/02/2019   LDLCALC 32 06/02/2019   LDLCALC 110 (H) 08/11/2017   Lab Results  Component Value Date   TSH 2.589 01/23/2015   TSH 2.765 12/25/2013    Therapeutic Level Labs: No results found for: LITHIUM No results found for: VALPROATE No components found for:  CBMZ  Current Medications: Current Outpatient Medications  Medication Sig Dispense Refill  . atomoxetine (STRATTERA) 100 MG capsule Take 1 capsule (100 mg total) by mouth daily. 90 capsule 1  . atorvastatin (LIPITOR) 40 MG tablet TAKE 1 TABLET BY MOUTH DAILY AT 6 PM. 90 tablet 1  . buPROPion (WELLBUTRIN SR) 150 MG 12 hr tablet Take 2 tablets (300 mg total) by mouth daily. 60 tablet 5  . [place] 450 MG CAPS Take by mouth.    . ciclopirox (PENLAC) 8 %  solution Apply topically at bedtime. Apply over nail and surrounding skin. Apply daily over previous coat. After seven (7) days, may remove with alcohol and continue cycle. 6.6 mL 4  . diclofenac Sodium (VOLTAREN) 1 % GEL RUB IN 2 GRAMS ONTO AFFECTED AREA OF FOOT 2 TO 4 TIMES A DAY. 100 g 2  . gabapentin (NEURONTIN) 100 MG capsule TAKE 1 CAPSULE BY MOUTH AT BEDTIME 90 capsule 5  . glucose blood (BAYER CONTOUR TEST) test strip 1 each by Other route daily. Use as instructed.  Dx: E11.69 100 each 12  . losartan (COZAAR) 25 MG tablet Take 1 tablet (25 mg total) by mouth daily. 90 tablet 1  . metFORMIN (GLUCOPHAGE)  1000 MG tablet Take 1 tablet (1,000 mg total) by mouth daily with breakfast. 90 tablet 3  . Methylcobalamin (B-12) 5000 MCG TBDP Take by mouth.    . mirtazapine (REMERON) 15 MG tablet Take 1 tablet (15 mg total) by mouth at bedtime. 90 tablet 1  . Misc Natural Products (JOINT HEALTH PO) Take 40 mg by mouth.    . Multiple Vitamin (MULTIVITAMIN) tablet Take by mouth.    . Omega-3 Fatty Acids (FISH OIL) 1000 MG CAPS Take by mouth.    Marland Kitchen omeprazole (PRILOSEC) 40 MG capsule     . QC ENTERIC ASPIRIN 325 MG EC tablet   0  . temazepam (RESTORIL) 30 MG capsule TAKE 1 CAPSULE BY MOUTH AT BEDTIME AS NEEDED FOR SLEEP. 30 capsule 2  . Turmeric 450 MG CAPS Take 500 mg by mouth.      No current facility-administered medications for this visit.       Psychiatric Specialty Exam: Review of Systems  Psychiatric/Behavioral: Positive for decreased concentration. The patient is nervous/anxious.   All other systems reviewed and are negative.   There were no vitals taken for this visit.There is no height or weight on file to calculate BMI.  General Appearance: NA  Eye Contact:  NA  Speech:  Clear and Coherent and Normal Rate  Volume:  Normal  Mood:  Some anxiety and depression.  Affect:  NA  Thought Process:  Goal Directed  Orientation:  Full (Time, Place, and Person)  Thought Content: Logical    Suicidal Thoughts:  No  Homicidal Thoughts:  No  Memory:  Immediate;   Fair Recent;   Fair Remote;   Good  Judgement:  Good  Insight:  Good  Psychomotor Activity:  NA  Concentration:  Concentration: Fair  Recall:  Fair  Fund of Knowledge: Good  Language: Good  Akathisia:  Negative  Handed:  Right  AIMS (if indicated): not done  Assets:  Communication Skills Desire for Improvement Housing Resilience Social Support  ADL's:  Intact  Cognition: WNL  Sleep:  Fair   Screenings: GAD-7     Office Visit from 09/04/2019 in [facility] Office Visit from 06/02/2019 in [facility] Office Visit from 10/04/2018 in [facility]  Total GAD-7 Score 11 8 3     Mini-Mental     [facility]/2019 [facility]  Total Score (max 30 points ) 29    PHQ2-9     Office Visit from 02/29/2020 in [facility] Visit from 09/04/2019 in [facility] Office Visit from 06/02/2019 in [facility] Office Visit from 10/04/2018 in [facility] Office Visit from 08/06/2017 in [facility]  PHQ-2 Total Score 5 3 4 2  0  PHQ-9 Total Score 18 12 13 8  --       Assessment and Plan: 59yo married male with GAD/MDDwho hasalso been struggling with problems with focusing, forgetfulnesssince elementary school. He has never been formally diagnosed or treated for ADHD, which he clearly has. We have added Wellbutrin (also for depressed mood) and Strattera and his focusing has improved. His mood improvedas wellafter increase of the dose of bupropion SR to 200 mg bidbut he would often forget to take afternoon dose. We then changed it to XR 300 mg and he noticed that it does not work as well as previous form did. Less energy, less  motivation after the change.He has failed trials  of venlafaxine, duloxetine and most recently vortioxetine. The last two he could not tolerate due to nausea/vomiting.He is now back on Wellbutrin SR 300 mg in am. Mood varies but depression has not remitted.He hadproblems with sleep but reports sleeping well enough after takingRestoril30 mg(earlier tried diazepam also without much benefit).Most recently he has been taking over the counter sleep aid and using temazepam only as a "backup" 1-2 x a week. Other continues to complain aboutpoorshort termmemory/forgetfulness. He has had CVA/TIAand family hx ofAlzheimer's disease (father). He has been seen by Dr. Royston Sinner (neurologist)andlabs and brain MRI were not different from ones late last year.He is on long term disability(and SSD)and and has decided not to look for another job out of concern that his ongoing problems with STM will make it impossible to carry out well; he said theprevious one was terminated due to COVID-19.  Dx: AdultADD; GAD; MDD mildto moderate; Amnestic disorder  Plan: We willcontinueWellbutrin SR300 mg dailydose, temazepam 30 mg prn insomnia, Stratterato 100mg  in AMandmirtazapine 15 mg at HS for both sleep and depression. Next visit in3 months.The plan was discussed with patient who had an opportunity to ask questions and these were all answered. I spend71minutes in phone consultation with the patient.    12m, MD 07/15/2020, 9:15 AM

## 2020-10-14 ENCOUNTER — Telehealth (INDEPENDENT_AMBULATORY_CARE_PROVIDER_SITE_OTHER): Payer: Self-pay | Admitting: Psychiatry

## 2020-10-14 ENCOUNTER — Other Ambulatory Visit: Payer: Self-pay

## 2020-10-14 ENCOUNTER — Encounter: Payer: Self-pay | Admitting: Internal Medicine

## 2020-10-14 DIAGNOSIS — F330 Major depressive disorder, recurrent, mild: Secondary | ICD-10-CM

## 2020-10-14 DIAGNOSIS — F411 Generalized anxiety disorder: Secondary | ICD-10-CM

## 2020-10-14 DIAGNOSIS — F909 Attention-deficit hyperactivity disorder, unspecified type: Secondary | ICD-10-CM

## 2020-10-14 NOTE — Progress Notes (Signed)
BH MD/PA/NP OP Progress Note  10/14/2020 9:09 AM Curtis Lam  MRN:  [id number] Interview was conducted by phone and I verified that I was speaking with the correct person using two identifiers. I discussed the limitations of evaluation and management by telemedicine and  the availability of in person appointments. Patient expressed understanding and agreed to proceed. Patient location - home; physician - home office.  Chief Complaint: Some depression, forgetfulness.  HPI: 59yo married male with GAD/MDDwho hasalso been struggling with problems with focusing, forgetfulnesssince elementary school. He has never been formally diagnosed or treated for ADHD, which he clearly has. We have added Wellbutrin (also for depressed mood) and Strattera and his focusing has improved. His mood improvedas wellafter increase of the dose of bupropion SR to 200 mg bidbut he would often forget to take afternoon dose. We then changed it to XR 300 mg and he noticed that it does not work as well as previous form did. Less energy, less motivation after the change.He has failed trials of venlafaxine, duloxetine and most recently vortioxetine. The last two he could not tolerate due to nausea/vomiting.He is now back on Wellbutrin SR 300 mg in am. Mood varies but depression has not remitted.He hadproblems with sleep but reports sleeping well enough after takingRestoril30 mg(earlier tried diazepam also without much benefit).Most recently he has been taking over the counter sleep aid and using temazepam not ,ote than 1-2 x a week. Curtis NeedleMichael continues to complain aboutpoorshort termmemory/forgetfulness. He has had CVA/TIAand family hx ofAlzheimer's disease (father). He has been seen by Dr. Royston SinnerJoseph Miller (neurologist)andlabs and brain MRI were not different from ones late last year.He is on long term disability(and SSD)andand has decided not tolook for another job out of concern that his ongoing problems with  STM will make it impossible to carry out well; he said theprevious one was terminated due to COVID-19.Despite persistent residual depression he is not interested in further trials/chenges to his antidepressants.    Visit Diagnosis:    ICD-10-CM   1. Major depressive disorder, recurrent episode, mild (HCC)  F33.0   2. Generalized anxiety disorder  F41.1   3. Adult ADHD  F90.9     Past Psychiatric History: Please see intake H&P.  Past Medical History:  Past Medical History:  Diagnosis Date  . Anxiety   . Depression   . Diabetes mellitus, type II (HCC)   . Elevated cholesterol   . History of migraine headaches    resolved s/p stopping ibuprofen  . Rectal polyp   . Schizoaffective disorder (HCC)   . 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 COLONOSCOPY  01/02/13  . TONSILLECTOMY    . VASECTOMY      Family Psychiatric History: Reviewed.  Family History:  Family History  Problem Relation Age of Onset  . Alzheimer's disease Father   . Depression Sister   . Diabetes Son 30  . Cancer Paternal Aunt        uterine, ovarian  . Diabetes Maternal Grandfather   . Heart disease Maternal Grandfather     Social History:  Social History   Socioeconomic History  . Marital status: Married    Spouse name: Not on file  . Number of children: 2  . Years of education: Not on file  . Highest education level: High school graduate  Occupational History  . 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  Smoking status: Never Smoker  . Smokeless tobacco: Never Used  Vaping Use  . Vaping Use: Never used  Substance and Sexual Activity  . Alcohol use: Yes    Alcohol/week: 1.0 standard drink    Types: 1 Cans of beer per week    Comment: 1 beer or wine 2-3 times per month, more in summer than winter  . Drug use: No  . Sexual activity: Yes    Partners: Female  Other Topics Concern  .  Not on file  Social History Narrative   Lives with wife and son.  Other son lives in [place].  Wife suffers from depression, recent flare and hospitalization (12/2013); He lost his job 09/2014 for 4-5 weeks, and got new job   Social Determinants of Corporate investment banker Strain:   . Difficulty of Paying Living Expenses: Not on file  Food Insecurity:   . Worried About Programme researcher, broadcasting/film/video in the Last Year: Not on file  . Ran Out of Food in the Last Year: Not on file  Transportation Needs:   . Lack of Transportation (Medical): Not on file  . Lack of Transportation (Non-Medical): Not on file  Physical Activity:   . Days of Exercise per Week: Not on file  . Minutes of Exercise per Session: Not on file  Stress:   . Feeling of Stress : Not on file  Social Connections:   . Frequency of Communication with Friends and Family: Not on file  . Frequency of Social Gatherings with Friends and Family: Not on file  . Attends Religious Services: Not on file  . Active Member of Clubs or Organizations: Not on file  . Attends Banker Meetings: Not on file  . Marital Status: Not on file    Allergies: No Known Allergies  Metabolic Disorder Labs: Lab Results  Component Value Date   HGBA1C 6.3 05/27/2020   MPG 126 (H) 01/23/2015   MPG 120 (H) 12/25/2013   No results found for: PROLACTIN Lab Results  Component Value Date   CHOL 97 06/02/2019   TRIG 128.0 06/02/2019   HDL 39.70 06/02/2019   CHOLHDL 2 06/02/2019   VLDL 25.6 06/02/2019   LDLCALC 32 06/02/2019   LDLCALC 110 (H) 08/11/2017   Lab Results  Component Value Date   TSH 2.589 01/23/2015   TSH 2.765 12/25/2013    Therapeutic Level Labs: No results found for: LITHIUM No results found for: VALPROATE No components found for:  CBMZ  Current Medications: Current Outpatient Medications  Medication Sig Dispense Refill  . atomoxetine (STRATTERA) 100 MG capsule Take 1 capsule (100 mg total) by mouth daily. 90 capsule 1  .  atorvastatin (LIPITOR) 40 MG tablet TAKE 1 TABLET BY MOUTH DAILY AT 6 PM. 90 tablet 1  . buPROPion (WELLBUTRIN SR) 150 MG 12 hr tablet Take 2 tablets (300 mg total) by mouth daily. 60 tablet 5  . Cascara Sagrada 450 MG CAPS Take by mouth.    . ciclopirox (PENLAC) 8 % solution Apply topically at bedtime. Apply over nail and surrounding skin. Apply daily over previous coat. After seven (7) days, may remove with alcohol and continue cycle. 6.6 mL 4  . diclofenac Sodium (VOLTAREN) 1 % GEL RUB IN 2 GRAMS ONTO AFFECTED AREA OF FOOT 2 TO 4 TIMES A DAY. 100 g 2  . gabapentin (NEURONTIN) 100 MG capsule TAKE 1 CAPSULE BY MOUTH AT BEDTIME 90 capsule 5  . glucose blood (BAYER CONTOUR TEST) test strip 1  each by Other route daily. Use as instructed.  Dx: E11.69 100 each 12  . losartan (COZAAR) 25 MG tablet Take 1 tablet (25 mg total) by mouth daily. 90 tablet 1  . metFORMIN (GLUCOPHAGE) 1000 MG tablet Take 1 tablet (1,000 mg total) by mouth daily with breakfast. 90 tablet 3  . Methylcobalamin (B-12) 5000 MCG TBDP Take by mouth.    . mirtazapine (REMERON) 15 MG tablet Take 1 tablet (15 mg total) by mouth at bedtime. 90 tablet 1  . Misc Natural Products (JOINT HEALTH PO) Take 40 mg by mouth.    . Multiple Vitamin (MULTIVITAMIN) tablet Take by mouth.    . Omega-3 Fatty Acids (FISH OIL) 1000 MG CAPS Take by mouth.    Marland Kitchen omeprazole (PRILOSEC) 40 MG capsule     . QC ENTERIC ASPIRIN 325 MG EC tablet   0  . temazepam (RESTORIL) 30 MG capsule TAKE 1 CAPSULE BY MOUTH AT BEDTIME AS NEEDED FOR SLEEP. 30 capsule 2  . Turmeric 450 MG CAPS Take 500 mg by mouth.      No current facility-administered medications for this visit.    Psychiatric Specialty Exam: Review of Systems  Psychiatric/Behavioral: The patient is nervous/anxious.   All other systems reviewed and are negative.   There were no vitals taken for this visit.There is no height or weight on file to calculate BMI.  General Appearance: NA  Eye Contact:  NA   Speech:  Clear and Coherent and Normal Rate  Volume:  Normal  Mood:  Depressed  Affect:  NA  Thought Process:  Goal Directed  Orientation:  Full (Time, Place, and Person)  Thought Content: Logical   Suicidal Thoughts:  No  Homicidal Thoughts:  No  Memory:  Immediate;   Fair Recent;   Poor Remote;   Fair  Judgement:  Good  Insight:  Good  Psychomotor Activity:  NA  Concentration:  Concentration: Fair  Recall:  Fair  Fund of Knowledge: Good  Language: Good  Akathisia:  Negative  Handed:  Right  AIMS (if indicated): not done  Assets:  Communication Skills Housing Resilience Social Support  ADL's:  Intact  Cognition: WNL  Sleep:  Fair   Screenings: GAD-7     Office Visit from 09/04/2019 in [facility] Office Visit from 06/02/2019 in [facility] Office Visit from 10[facility]04/2018 in [facility]  Total GAD-7 Score 11 8 3     Mini-Mental     [facility]  Total Score (max 30 points ) 29    PHQ2-9     Office Visit from 02/29/2020 in [facility] [place] Visit from 09/04/2019 in [facility] Office Visit from 06/02/2019 in [facility] Office Visit from 10/04/2018 in [facility] Office Visit from 08/06/2017 in [facility]  PHQ-2 Total Score 5 3 4 2  0  PHQ-9 Total Score 18 12 13 8  --       Assessment and Plan: 59yo married male with GAD/MDDwho hasalso been struggling with problems with focusing, forgetfulnesssince elementary school. He has never been formally diagnosed or treated for ADHD, which he clearly has. We have added Wellbutrin (also for depressed mood) and Strattera and his focusing has improved. His mood improvedas wellafter increase of the dose of bupropion SR to 200 mg bidbut he would often forget to take  afternoon dose.  We then changed it to XR 300 mg and he noticed that it does not work as well as previous form did. Less energy, less motivation after the change.He has failed trials of venlafaxine, duloxetine and most recently vortioxetine. The last two he could not tolerate due to nausea/vomiting.He is now back on Wellbutrin SR 300 mg in am. Mood varies but depression has not remitted.He hadproblems with sleep but reports sleeping well enough after takingRestoril30 mg(earlier tried diazepam also without much benefit).Most recently he has been taking over the counter sleep aid and using temazepam not ,ote than 1-2 x a week. Geovannie continues to complain aboutpoorshort termmemory/forgetfulness. He has had CVA/TIAand family hx ofAlzheimer's disease (father). He has been seen by Dr. Royston Sinner (neurologist)andlabs and brain MRI were not different from ones late last year.He is on long term disability(and SSD)andand has decided not tolook for another job out of concern that his ongoing problems with STM will make it impossible to carry out well.Despite persistent residual depression he is not interested in further trials/chenges to his antidepressants.  Dx: AdultADD; GAD; MDD mildto moderate;Amnestic disorder  Plan: We willcontinueWellbutrin SR300 mg dailydose, temazepam 30 mg prn insomnia, Stratterato 100mg  in AMandmirtazapine 15 mg at HS for both sleep and depression. Next visit in3 months.The plan was discussed with patient who had an opportunity to ask questions and these were all answered. I spend41minutes in phone consultation with the patient.     12m, MD 10/14/2020, 9:09 AM

## 2020-10-31 ENCOUNTER — Other Ambulatory Visit: Payer: Self-pay

## 2020-10-31 ENCOUNTER — Ambulatory Visit (INDEPENDENT_AMBULATORY_CARE_PROVIDER_SITE_OTHER): Payer: Self-pay | Admitting: Podiatry

## 2020-10-31 DIAGNOSIS — M79672 Pain in left foot: Secondary | ICD-10-CM

## 2020-10-31 DIAGNOSIS — M79671 Pain in right foot: Secondary | ICD-10-CM

## 2020-10-31 DIAGNOSIS — E1149 Type 2 diabetes mellitus with other diabetic neurological complication: Secondary | ICD-10-CM

## 2020-10-31 NOTE — Patient Instructions (Signed)

## 2020-11-01 ENCOUNTER — Other Ambulatory Visit: Payer: Self-pay | Admitting: Internal Medicine

## 2020-11-01 NOTE — Progress Notes (Signed)
Subjective: 59 year old male presents the office today for follow-up evaluation of neuropathy.  He states he was trying to come off medications so he stopped the gabapentin.  Afterwards he started noticing increased discomfort in his foot he returned back to the medication and his symptoms have since resolved.  He is taking 100 mg at nighttime.  He has had some weight gain that he thinks may be coming from gabapentin but otherwise he is tolerating it well.  He denies any open sores to his feet and denies any swelling.  Denies any systemic complaints such as fevers, chills, nausea, vomiting. No acute changes since last appointment, and no other complaints at this time.   Objective: AAO x3, NAD DP/PT pulses palpable bilaterally, CRT less than 3 seconds Sensation intact with Semmes Weinstein monofilament. There is no significant area discomfort bilaterally.  There is no edema, erythema.  There is no open lesions. Cavus foot type is present. MMT 5/5 No pain with calf compression, swelling, warmth, erythema  Assessment: Neuropathy  Plan: -All treatment options discussed with the patient including all alternatives, risks, complications.  -He is doing well on gabapentin continue with this medication.  We did discuss the modifications and possibly orthotics easily consider doing custom inserts in the future.  In regards to other treatments besides medication we discussed alternatives treatments for neuropathy including Neurogenix discussed possible success rates of this.  Also B complex vitamin. -Patient encouraged to call the office with any questions, concerns, change in symptoms.   Vivi Barrack DPM

## 2020-11-09 ENCOUNTER — Encounter: Payer: Self-pay | Admitting: Internal Medicine

## 2020-11-11 ENCOUNTER — Other Ambulatory Visit: Payer: Self-pay

## 2020-11-11 ENCOUNTER — Telehealth: Payer: Self-pay | Admitting: Internal Medicine

## 2020-11-11 MED ORDER — ATORVASTATIN CALCIUM 40 MG PO TABS
ORAL_TABLET | ORAL | 0 refills | Status: DC
Start: 2020-11-11 — End: 2021-07-15

## 2020-11-11 NOTE — Telephone Encounter (Signed)
atorvastatin (LIPITOR) 40 MG tablet [id number] - [place], [place] - [address] Phone:  [phone]  Fax:  [phone]     Requesting a refill, pt almost out of medication.

## 2020-12-08 ENCOUNTER — Other Ambulatory Visit: Payer: Self-pay | Admitting: Internal Medicine

## 2020-12-11 ENCOUNTER — Other Ambulatory Visit: Payer: Self-pay | Admitting: Internal Medicine

## 2020-12-12 ENCOUNTER — Other Ambulatory Visit: Payer: Self-pay | Admitting: *Deleted

## 2020-12-12 ENCOUNTER — Encounter: Payer: Self-pay | Admitting: Internal Medicine

## 2020-12-12 MED ORDER — LOSARTAN POTASSIUM 25 MG PO TABS
25.0000 mg | ORAL_TABLET | Freq: Every day | ORAL | 1 refills | Status: DC
Start: 2020-12-12 — End: 2021-07-10

## 2020-12-31 ENCOUNTER — Other Ambulatory Visit (HOSPITAL_COMMUNITY): Payer: Self-pay | Admitting: Psychiatry

## 2021-01-10 ENCOUNTER — Other Ambulatory Visit: Payer: Self-pay

## 2021-01-10 ENCOUNTER — Telehealth (HOSPITAL_COMMUNITY): Payer: Self-pay | Admitting: Psychiatry

## 2021-01-10 ENCOUNTER — Other Ambulatory Visit (HOSPITAL_COMMUNITY): Payer: Self-pay | Admitting: Psychiatry

## 2021-01-10 MED ORDER — ATOMOXETINE HCL 100 MG PO CAPS: 100.0000 mg | ORAL_CAPSULE | Freq: Every day | ORAL | 1 refills | Status: DC

## 2021-01-10 MED ORDER — BUPROPION HCL ER (SR) 150 MG PO TB12: 300.0000 mg | ORAL_TABLET | Freq: Every day | ORAL | 0 refills | Status: DC

## 2021-01-10 MED ORDER — MIRTAZAPINE 15 MG PO TABS: 15.0000 mg | ORAL_TABLET | Freq: Every day | ORAL | 1 refills | Status: DC

## 2021-01-13 ENCOUNTER — Other Ambulatory Visit (HOSPITAL_COMMUNITY): Payer: Self-pay | Admitting: Psychiatry

## 2021-01-13 ENCOUNTER — Telehealth (INDEPENDENT_AMBULATORY_CARE_PROVIDER_SITE_OTHER): Payer: Self-pay | Admitting: Psychiatry

## 2021-01-13 ENCOUNTER — Other Ambulatory Visit: Payer: Self-pay

## 2021-01-13 DIAGNOSIS — F330 Major depressive disorder, recurrent, mild: Secondary | ICD-10-CM

## 2021-01-13 DIAGNOSIS — F909 Attention-deficit hyperactivity disorder, unspecified type: Secondary | ICD-10-CM

## 2021-01-13 DIAGNOSIS — F411 Generalized anxiety disorder: Secondary | ICD-10-CM

## 2021-01-13 MED ORDER — TEMAZEPAM 30 MG PO CAPS: ORAL_CAPSULE | ORAL | 2 refills | Status: DC

## 2021-01-13 NOTE — Progress Notes (Signed)
BH MD/PA/NP OP Progress Note  01/13/2021 8:38 AM Curtis Lam  MRN:  [id number] Interview was conducted by phone and I verified that I was speaking with the correct person using two identifiers. I discussed the limitations of evaluation and management by telemedicine and  the availability of in person appointments. Patient expressed understanding and agreed to proceed. Participants in the visit: patient (location - home); physician (location - home office).  Chief Complaint: Insomnia.  HPI: 60yo married male with GAD/MDDwho hasalso been struggling with problems with focusing, forgetfulnesssince elementary school. He has never been formally diagnosed or treated for ADHD, which he clearly has. We have added Wellbutrin (also for depressed mood) and Strattera and his focusing has improved. His mood improvedas wellafter increase of the dose of bupropion SR to 200 mg bidbut he would often forget to take afternoon dose. We then changed it to XR 300 mg and he noticed that it does not work as well as previous form did. Less energy, less motivation after the change.He has failed trials of venlafaxine, duloxetine and most recently vortioxetine. The last two he could not tolerate due to nausea/vomiting.He is now back on Wellbutrin SR 300 mg in am. Mood varies but depression has not remitted.He hadproblems with sleep but reports sleeping well enough after takingRestoril30 mg(earlier tried diazepam also without much benefit). He has not been using it lately and his sleep again deteriorated. Curtis Lam continues to complain aboutpoorshort termmemory/forgetfulness. He has had CVA/TIAand family hx ofAlzheimer's disease (father). He has been seen by Dr. Royston Sinner (neurologist)andlabs and brain MRI were not different from ones in late 2020.He is on long term disability(and SSD)andand has decided not tolook for another job out of concern that his ongoing problems with STM will make it impossible  to carry out well.Despite persistent residual depression he is not interested in further trials/changes to his antidepressants.    Visit Diagnosis:    ICD-10-CM   1. Adult ADHD  F90.9   2. Mild episode of recurrent major depressive disorder (HCC)  F33.0   3. Generalized anxiety disorder  F41.1     Past Psychiatric History: Please see intake H&P.  Past Medical History:  Past Medical History:  Diagnosis Date  . Anxiety   . Depression   . Diabetes mellitus, type II (HCC)   . Elevated cholesterol   . History of migraine headaches    resolved s/p stopping ibuprofen  . Rectal polyp   . Schizoaffective disorder (HCC)   . Stroke [facility] 05/2013   vertebral artery dissection    Past Surgical History:  Procedure Laterality Date  . COLONOSCOPY  01/02/13  . TONSILLECTOMY    . VASECTOMY      Family Psychiatric History: Reviewed.  Family History:  Family History  Problem Relation Age of Onset  . Curtis disease Father   . Depression Sister   . Diabetes Son 30  . Cancer Paternal Aunt        uterine, ovarian  . Diabetes Maternal Grandfather   . Heart disease Maternal Grandfather     Social History:  Social History   Socioeconomic History  . Marital status: Married    Spouse name: Not on file  . Number of children: 2  . Years of education: Not on file  . Highest education level: High school graduate  Occupational History  . Occupation: Engineer, mining)    Employer: [employer]  Tobacco Use  . Smoking status: Never Smoker  . Smokeless tobacco: Never Used  Vaping  Use  . Vaping Use: Never used  Substance and Sexual Activity  . Alcohol use: Yes    Alcohol/week: 1.0 standard drink    Types: 1 Cans of beer per week    Comment: 1 beer or wine 2-3 times per month, more in summer than winter  . Drug use: No  . Sexual activity: Yes    Partners: Female  Other Topics Concern  . Not on file  Social History Narrative   Lives with wife and son.  Other son lives  in [place].  Wife suffers from depression, recent flare and hospitalization (12/2013); He lost his job 09/2014 for 4-5 weeks, and got new job   Social Determinants of Health   Financial Resource Strain: Not on file  Food Insecurity: Not on file  Transportation Needs: Not on file  Physical Activity: Not on file  Stress: Not on file  Social Connections: Not on file    Allergies: No Known Allergies  Metabolic Disorder Labs: Lab Results  Component Value Date   HGBA1C 6.3 05/27/2020   MPG 126 (H) 01/23/2015   MPG 120 (H) 12/25/2013   No results found for: PROLACTIN Lab Results  Component Value Date   CHOL 97 06/02/2019   TRIG 128.0 06/02/2019   HDL 39.70 06/02/2019   CHOLHDL 2 06/02/2019   VLDL 25.6 06/02/2019   LDLCALC 32 06/02/2019   LDLCALC 110 (H) 08/11/2017   Lab Results  Component Value Date   TSH 2.589 01/23/2015   TSH 2.765 12/25/2013    Therapeutic Level Labs: No results found for: LITHIUM No results found for: VALPROATE No components found for:  CBMZ  Current Medications: Current Outpatient Medications  Medication Sig Dispense Refill  . atomoxetine (STRATTERA) 100 MG capsule Take 1 capsule (100 mg total) by mouth daily. 90 capsule 1  . atorvastatin (LIPITOR) 40 MG tablet TAKE 1 TABLET BY MOUTH DAILY AT 6 PM. 30 tablet 0  . buPROPion (WELLBUTRIN SR) 150 MG 12 hr tablet Take 2 tablets (300 mg total) by mouth daily. 180 tablet 0  . Cascara Sagrada 450 MG CAPS Take by mouth.    . ciclopirox (PENLAC) 8 % solution Apply topically at bedtime. Apply over nail and surrounding skin. Apply daily over previous coat. After seven (7) days, may remove with alcohol and continue cycle. 6.6 mL 4  . diclofenac Sodium (VOLTAREN) 1 % GEL RUB IN 2 GRAMS ONTO AFFECTED AREA OF FOOT 2 TO 4 TIMES A DAY. 100 g 2  . gabapentin (NEURONTIN) 100 MG capsule TAKE 1 CAPSULE BY MOUTH AT BEDTIME 90 capsule 5  . glucose blood (BAYER CONTOUR TEST) test strip 1 each by Other route daily. Use as  instructed.  Dx: E11.69 100 each 12  . losartan (COZAAR) 25 MG tablet Take 1 tablet (25 mg total) by mouth daily. 90 tablet 1  . metFORMIN (GLUCOPHAGE) 1000 MG tablet Take 1 tablet (1,000 mg total) by mouth daily with breakfast. 90 tablet 3  . Methylcobalamin (B-12) 5000 MCG TBDP Take by mouth.    . mirtazapine (REMERON) 15 MG tablet Take 1 tablet (15 mg total) by mouth at bedtime. 90 tablet 1  . Misc Natural Products (JOINT HEALTH PO) Take 40 mg by mouth.    . Multiple Vitamin (MULTIVITAMIN) tablet Take by mouth.    . Omega-3 Fatty Acids (FISH OIL) 1000 MG CAPS Take by mouth.    Marland Kitchen omeprazole (PRILOSEC) 40 MG capsule     . QC ENTERIC ASPIRIN 325 MG EC tablet  0  . temazepam (RESTORIL) 30 MG capsule TAKE 1 CAPSULE BY MOUTH AT BEDTIME AS NEEDED FOR SLEEP. 30 capsule 2  . Turmeric 450 MG CAPS Take 500 mg by mouth.      No current facility-administered medications for this visit.     Psychiatric Specialty Exam: Review of Systems  Psychiatric/Behavioral: Positive for sleep disturbance.  All other systems reviewed and are negative.   There were no vitals taken for this visit.There is no height or weight on file to calculate BMI.  General Appearance: NA  Eye Contact:  NA  Speech:  Clear and Coherent and Normal Rate  Volume:  Normal  Mood:  Some depression  Affect:  NA  Thought Process:  Goal Directed  Orientation:  Full (Time, Place, and Person)  Thought Content: Logical   Suicidal Thoughts:  No  Homicidal Thoughts:  No  Memory:  Immediate;   Fair Recent;   Fair Remote;   Fair  Judgement:  Good  Insight:  Good  Psychomotor Activity:  NA  Concentration:  Concentration: Fair  Recall:  Fair  Fund of Knowledge: Good  Language: Good  Akathisia:  Negative  Handed:  Right  AIMS (if indicated): not done  Assets:  Communication Skills Desire for Improvement Financial Resources/Insurance Housing Social Support  ADL's:  Intact  Cognition: WNL  Sleep:  Fair   Screenings: GAD-7    Flowsheet Row Office Visit from 09/04/2019 in [facility] Office Visit from 06/02/2019 in [facility] Office Visit from 10/04/2018 in [facility]  Total GAD-7 Score 11 8 3     Mini-Mental   Flowsheet Row Office Visit [facility]/2019 [facility]  Total Score (max 30 points ) 29    PHQ2-9   Flowsheet Row Office Visit from 02/29/2020 in [facility] Visit from 09/04/2019 in [facility] Office Visit from 06/02/2019 in [facility] Office Visit from 10/04/2018 in [facility] Office Visit from 08/06/2017 in [facility]  PHQ-2 Total Score 5 3 4 2  0  PHQ-9 Total Score 18 12 13 8  --       Assessment and Plan:  60yo married male with GAD/MDDwho hasalso been struggling with problems with focusing, forgetfulnesssince elementary school. He has never been formally diagnosed or treated for ADHD, which he clearly has. We have added Wellbutrin (also for depressed mood) and Strattera and his focusing has improved. His mood improvedas wellafter increase of the dose of bupropion SR to 200 mg bidbut he would often forget to take afternoon dose. We then changed it to XR 300 mg and he noticed that it does not work as well as previous form did. Less energy, less motivation after the change.He has failed trials of venlafaxine, duloxetine and most recently vortioxetine. The last two he could not tolerate due to nausea/vomiting.He is now back on Wellbutrin SR 300 mg in am. Mood varies but depression has not remitted.He hadproblems with sleep but reports sleeping well enough after takingRestoril30 mg(earlier tried diazepam also without much benefit). He has not been using it lately and his sleep again deteriorated. Aland continues to complain aboutpoorshort  termmemory/forgetfulness. He has had CVA/TIAand family hx ofAlzheimer's disease (father). He has been seen by Dr. (neurologist)andlabs and brain MRI were not different from ones in late 2020.He is on long term disability(and SSD)andand has decided not tolook for another  job out of concern that his ongoing problems with STM will make it impossible to carry out well.Despite persistent residual depression he is not interested in further trials/changes to his antidepressants.  Dx: AdultADD; GAD; MDD mild;Amnestic disorder  Plan: We willcontinueWellbutrin SR300 mg dailydose, temazepam 30 mg prn insomnia, Stratterato 100mg  in AMandmirtazapine 15 mg at HS for both sleep and depression. Next visit in3 months with a new provider.The plan was discussed with patient who had an opportunity to ask questions and these were all answered. I spend2215minutes in phone consultation with the patient.   Magdalene Patricialgierd A Renald Haithcock, MD 01/13/2021, 8:38 AM

## 2021-01-15 ENCOUNTER — Other Ambulatory Visit (HOSPITAL_COMMUNITY): Payer: Self-pay | Admitting: Psychiatry

## 2021-01-17 ENCOUNTER — Telehealth (HOSPITAL_COMMUNITY): Payer: Self-pay

## 2021-01-17 NOTE — Telephone Encounter (Signed)
Received paperwork faxed to [place] from [facility] for this patient. They sent [place] a Functional Mental Status Evaluation Form to fill out for his LT Disability. Do we do these types of forms? Please review and advise. Thank you

## 2021-01-18 NOTE — Telephone Encounter (Signed)
I don't believe we do.

## 2021-02-25 ENCOUNTER — Other Ambulatory Visit: Payer: Self-pay

## 2021-02-25 ENCOUNTER — Telehealth (INDEPENDENT_AMBULATORY_CARE_PROVIDER_SITE_OTHER): Payer: Self-pay | Admitting: Psychiatry

## 2021-02-25 DIAGNOSIS — R419 Unspecified symptoms and signs involving cognitive functions and awareness: Secondary | ICD-10-CM | POA: Insufficient documentation

## 2021-02-25 DIAGNOSIS — F909 Attention-deficit hyperactivity disorder, unspecified type: Secondary | ICD-10-CM

## 2021-02-25 DIAGNOSIS — F331 Major depressive disorder, recurrent, moderate: Secondary | ICD-10-CM

## 2021-02-25 DIAGNOSIS — F411 Generalized anxiety disorder: Secondary | ICD-10-CM

## 2021-02-25 MED ORDER — MIRTAZAPINE 30 MG PO TABS: 30.0000 mg | ORAL_TABLET | Freq: Every day | ORAL | 0 refills | Status: DC

## 2021-02-25 NOTE — Progress Notes (Signed)
BH MD/PA/NP OP Progress Note  02/25/2021 9:36 AM Curtis Lam  MRN:  [id number] Interview was conducted using videoconferencing application and I verified that I was speaking with the correct person using two identifiers. I discussed the limitations of evaluation and management by telemedicine and  the availability of in person appointments. Patient expressed understanding and agreed to proceed. Participants in the visit: patient (location - home); physician (location - home office).  Chief Complaint: Depression, anxiety, forgetfulness/STM issues.  HPI: 60yo married male with GAD/MDDwho hasalso been struggling with problems with focusing, forgetfulnesssince elementary school. He has never been formally diagnosed or treated for ADHD, which he clearly has. We have added Wellbutrin (also for depressed mood) and Strattera and his focusing has improved. His mood improvedas wellafter increase of the dose of bupropion SR to 200 mg bidbut he would often forget to take afternoon dose. We then changed it to XR 300 mg and he noticed that it does not work as well as previous form did. Less energy, less motivation after the change.He has failed trials of venlafaxine, duloxetine and most recently vortioxetine. The last two he could not tolerate due to nausea/vomiting.He is now back on Wellbutrin SR 300 mg in am. Mood varies but depression has not remitted.He hadproblems with sleep but reports sleeping well enough after takingRestoril30 mg(earlier tried diazepam also without much benefit). He has been using it nightly together with mirtazapine 15 mg. Yehudah continues to complain aboutpoorshort termmemory/forgetfulness. His recall is 3 out of 5 items in 3 minutes. He does not drive as he gets easily lost. He tried to use lists of things to do but it has not worked well. He has had CVA/TIAand family hx ofAlzheimer's disease (father). 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 He has been seen by Dr. Royston Sinner (neurologist)andlabs and brain MRI were not different from ones in late 2020.He is on long term disability(and SSD)andand has decided not tolook for another job out of concern that his ongoing problems with STM will make it impossible to carry out well.His depression and anxiety continue and he has been having passing suicidal thoughts without intent or plan. He lives with wife who is aware of that. In the past he was in counseling but did not find it particularly helpful due to "not remembering what we talked about".   Visit Diagnosis:    ICD-10-CM   1. Major depressive disorder, recurrent episode, moderate (HCC)  F33.1   2. Neurocognitive disorder  R41.9   3. Adult ADHD  F90.9   4. Generalized anxiety disorder  F41.1     Past Psychiatric History: Please see intake H&P.  Past Medical History:  Past Medical History:  Diagnosis Date  . Anxiety   . Depression   . Diabetes mellitus, type II (HCC)   . Elevated cholesterol   . History of migraine headaches    resolved s/p stopping ibuprofen  . Rectal polyp   . Schizoaffective disorder (HCC)   . 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 COLONOSCOPY  01/02/13  . TONSILLECTOMY    . VASECTOMY      Family Psychiatric History: Reviewed.  Family History:  Family History  Problem Relation Age of Onset  . Alzheimer's disease Father   . Depression Sister   . Diabetes Son 30  . Cancer Paternal Aunt  uterine, ovarian  . Diabetes Maternal Grandfather   . Heart disease Maternal Grandfather     Social History:  Social History   Socioeconomic History  . Marital status: Married    Spouse name: Not on file  . Number of children: 2  . Years of education: Not on file  . Highest education level: High school graduate  Occupational History  . Occupation: Administrator)    Employer: [employer]  Tobacco Use  . Smoking status: Never Smoker  . Smokeless tobacco: Never Used  Vaping Use  . Vaping Use: Never used  Substance and Sexual Activity  . Alcohol use: Yes    Alcohol/week: 1.0 standard drink    Types: 1 Cans of beer per week    Comment: 1 beer or wine 2-3 times per month, more in summer than winter  . Drug use: No  . Sexual activity: Yes    Partners: Female  Other Topics Concern  . Not on file  Social History Narrative   Lives with wife and son.  Other son lives in [place].  Wife suffers from depression, recent flare and hospitalization (12/2013); He lost his job 09/2014 for 4-5 weeks, and got new job   Social Determinants of Corporate investment banker Strain: Not on file  Food Insecurity: Not on file  Transportation Needs: Not on file  Physical Activity: Not on file  Stress: Not on file  Social Connections: Not on file    Allergies: No Known Allergies  Metabolic Disorder Labs: Lab Results  Component Value Date   HGBA1C 6.3 05/27/2020   MPG 126 (H) 01/23/2015   MPG 120 (H) 12/25/2013   No results found for: PROLACTIN Lab Results  Component Value Date   CHOL 97 06/02/2019   TRIG 128.0 06/02/2019   HDL 39.70 06/02/2019   CHOLHDL 2 06/02/2019   VLDL 25.6 06/02/2019   LDLCALC 32 06/02/2019   LDLCALC 110 (H) 08/11/2017   Lab Results  Component Value Date   TSH 2.589 01/23/2015   TSH 2.765 12/25/2013    Therapeutic Level Labs: No results found for: LITHIUM No results found for: VALPROATE No components found for:  CBMZ  Current Medications: Current Outpatient Medications  Medication Sig Dispense Refill  . mirtazapine (REMERON) 30 MG tablet Take 1 tablet (30 mg total) by mouth at bedtime. 90 tablet 0  . atomoxetine (STRATTERA) 100 MG capsule Take 1 capsule (100 mg total) by mouth daily. 90 capsule 1  . atorvastatin (LIPITOR) 40 MG tablet TAKE 1 TABLET BY MOUTH DAILY AT 6 PM. 30 tablet 0  . buPROPion (WELLBUTRIN SR)  150 MG 12 hr tablet Take 2 tablets (300 mg total) by mouth daily. 180 tablet 0  . [place] 450 MG CAPS Take by mouth.    . ciclopirox (PENLAC) 8 % solution Apply topically at bedtime. Apply over nail and surrounding skin. Apply daily over previous coat. After seven (7) days, may remove with alcohol and continue cycle. 6.6 mL 4  . diclofenac Sodium (VOLTAREN) 1 % GEL RUB IN 2 GRAMS ONTO AFFECTED AREA OF FOOT 2 TO 4 TIMES A DAY. 100 g 2  . gabapentin (NEURONTIN) 100 MG capsule TAKE 1 CAPSULE BY MOUTH AT BEDTIME 90 capsule 5  . glucose blood (BAYER CONTOUR TEST) test strip 1 each by Other route daily. Use as instructed.  Dx: E11.69 100 each 12  . losartan (COZAAR) 25 MG tablet Take 1 tablet (25 mg total) by mouth daily. 90 tablet  1  . metFORMIN (GLUCOPHAGE) 1000 MG tablet Take 1 tablet (1,000 mg total) by mouth daily with breakfast. 90 tablet 3  . Methylcobalamin (B-12) 5000 MCG TBDP Take by mouth.    . Misc Natural Products (JOINT HEALTH PO) Take 40 mg by mouth.    . Multiple Vitamin (MULTIVITAMIN) tablet Take by mouth.    . Omega-3 Fatty Acids (FISH OIL) 1000 MG CAPS Take by mouth.    Marland Kitchen. omeprazole (PRILOSEC) 40 MG capsule     . QC ENTERIC ASPIRIN 325 MG EC tablet   0  . temazepam (RESTORIL) 30 MG capsule TAKE ONE CAPSULE BY MOUTH AT BEDTIME AS NEEDED FOR SLEEP 30 capsule 0  . Turmeric 450 MG CAPS Take 500 mg by mouth.      No current facility-administered medications for this visit.    Psychiatric Specialty Exam: Review of Systems  Psychiatric/Behavioral: Positive for decreased concentration, sleep disturbance and suicidal ideas. The patient is nervous/anxious.   All other systems reviewed and are negative.   There were no vitals taken for this visit.There is no height or weight on file to calculate BMI.  General Appearance: Casual and Fairly Groomed  Eye Contact:  Good  Speech:  Clear and Coherent and Normal Rate  Volume:  Normal  Mood:  Anxious and Depressed  Affect:  Congruent  and Constricted  Thought Process:  Goal Directed  Orientation:  Full (Time, Place, and Person)  Thought Content: Rumination   Suicidal Thoughts:  Yes.  without intent/plan  Homicidal Thoughts:  No  Memory:  Immediate;   Poor Recent;   Poor Remote;   Good  Judgement:  Good  Insight:  Fair  Psychomotor Activity:  Normal  Concentration:  Concentration: Fair  Recall:  Poor  Fund of Knowledge: Good  Language: Good  Akathisia:  Negative  Handed:  Right  AIMS (if indicated): not done  Assets:  Communication Skills Desire for Improvement Financial Resources/Insurance Housing Social Support  ADL's:  Intact  Cognition: WNL  Sleep:  Fair   Screenings: GAD-7   Flowsheet Row [facility]  Total GAD-7 Score 11 8 3     Mini-Mental   Flowsheet Row [facility]  Total Score (max 30 points ) 29    PHQ2-9   Flowsheet Row Video Visit from 02/25/2021 in [facility]  PHQ-2 Total Score 4 5 3 4 2   PHQ-9 Total Score 14 18 12 13 8     Flowsheet Row Video Visit from 02/25/2021 in [facility]  C-SSRS RISK CATEGORY Low Risk       Assessment and Plan: 60yo married male with GAD/MDDwho hasalso been struggling with problems with focusing, forgetfulnesssince elementary school. He has never been formally diagnosed or treated for ADHD, which he clearly has. We have added Wellbutrin (also for  depressed mood) and Strattera and his focusing has improved. His mood improvedas wellafter increase of the dose of bupropion SR to 200 mg bidbut he would often forget to take afternoon dose. We then changed it to XR 300 mg  and he noticed that it does not work as well as previous form did. Less energy, less motivation after the change.He has failed trials of venlafaxine, duloxetine and most recently vortioxetine. The last two he could not tolerate due to nausea/vomiting.He is now back on Wellbutrin SR 300 mg in am. Mood varies but depression has not remitted.He hadproblems with sleep but reports sleeping well enough after takingRestoril30 mg(earlier tried diazepam also without much benefit). He has been using it nightly together with mirtazapine 15 mg. Raiyan continues to complain aboutpoorshort termmemory/forgetfulness. His recall is 3 out of 5 items in 3 minutes. He does not drive as he gets easily lost. He tried to use lists of things to do but it has not worked well. He has had CVA/TIAand family hx ofAlzheimer's disease (father). 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 He has been seen by Dr. Royston Sinner (neurologist)andlabs and brain MRI were not different from ones in late 2020.He is on long term disability(and SSD)andand has decided not tolook for another job out of concern that his ongoing problems with STM will make it impossible to carry out well.His depression and anxiety continue and he has been having passing suicidal thoughts without intent or plan. He lives with wife who is aware of that. In the past he was in counseling but did not find it particularly helpful due to "not remembering what we talked about".  Dx: MDD recurrent, moderate; AdultADD; GAD; Neurocognitive/Amnestic disorder  Plan: We willcontinueWellbutrin SR300 mg dailydose, temazepam 30 mg prn insomnia, Stratterato 100mg   in AMandincrease mirtazapine to 30 mg at HS for both sleep and depression/anxiety. Next visit in2 months with a new provider.The plan was discussed with patient who had an opportunity to ask questions and these were all answered. I spend79minutes in video visit with the patient.    21m, MD 02/25/2021, 9:36 AM

## 2021-03-04 ENCOUNTER — Telehealth: Payer: Self-pay | Admitting: Internal Medicine

## 2021-03-04 NOTE — Telephone Encounter (Signed)
Spoke with patient and wife. She is faxing over physical statement - Patient has been on disability.  LOV: 05/2020 Nothing on AVS stating when patient needs to return, Please advise if you need to see patient before completing form. Thank you.

## 2021-03-04 NOTE — Telephone Encounter (Signed)
Forms have been received via fax and given to provider to review.

## 2021-03-04 NOTE — Telephone Encounter (Signed)
Yes, he is one writing him out. Patient's wife states they are requesting a form to be completed by all his doctors.

## 2021-03-04 NOTE — Telephone Encounter (Signed)
I can review the form and advise once I see.

## 2021-03-04 NOTE — Telephone Encounter (Signed)
His mental health doctor has been advising him to stay out of work and seeing him regularly also filling out this form so patient should send form and contact that office regarding this.

## 2021-03-04 NOTE — Telephone Encounter (Signed)
Patients wife called and said that the patients company is needing medical records from 05/21/2020-02/2021. She said that a physicians statement was sent to the office. Transferred to medical records. She is requesting a call back at [phone]. Please advise

## 2021-03-06 NOTE — Telephone Encounter (Signed)
LVM to inform patient will need an appointment before forms can be completed - Pre Dr.Crawford.

## 2021-03-07 NOTE — Telephone Encounter (Signed)
Appointment has been made for 03/10/21.

## 2021-03-10 ENCOUNTER — Other Ambulatory Visit: Payer: Self-pay

## 2021-03-10 ENCOUNTER — Ambulatory Visit (INDEPENDENT_AMBULATORY_CARE_PROVIDER_SITE_OTHER): Payer: Self-pay | Admitting: Internal Medicine

## 2021-03-10 ENCOUNTER — Encounter: Payer: Self-pay | Admitting: Internal Medicine

## 2021-03-10 VITALS — BP 138/80 | HR 87 | Temp 98.2°F | Resp 18 | Ht 67.0 in | Wt 194.4 lb

## 2021-03-10 DIAGNOSIS — E1140 Type 2 diabetes mellitus with diabetic neuropathy, unspecified: Secondary | ICD-10-CM

## 2021-03-10 DIAGNOSIS — Z0279 Encounter for issue of other medical certificate: Secondary | ICD-10-CM

## 2021-03-10 DIAGNOSIS — E1169 Type 2 diabetes mellitus with other specified complication: Secondary | ICD-10-CM

## 2021-03-10 LAB — HEMOGLOBIN A1C: Hgb A1c MFr Bld: 7.3 % — ABNORMAL HIGH (ref 4.6–6.5)

## 2021-03-10 NOTE — Telephone Encounter (Signed)
Forms have been completed &Sign by provider, Faxed, Copy sent to scan &Charged for.   Original mailed to patient for his records.

## 2021-03-10 NOTE — Patient Instructions (Addendum)
We have filled out the forms for you and will get them sent in.  We will check the labs today to check the sugars.

## 2021-03-10 NOTE — Progress Notes (Unsigned)
   Subjective:   Patient ID: Curtis Lam, male    DOB: May 16, 1961, 60 y.o.   MRN: [id number]  HPI The patient is a 60 YO man coming in for evaluation for disability and follow up diabetes. Still taking metformin 1 pill in the morning 1000 mg. Denies new neuropathy changes. Not checking sugar levels regularly. Does have meter at home and does check when feeling poorly which is several times a week. Denies chest pains or headaches. Still struggling with some dizziness at times. Still a lot of concentration and mood difficulties and working with mental health on those.   Review of Systems  Constitutional: Negative.   HENT: Negative.   Eyes: Negative.   Respiratory: Negative for cough, chest tightness and shortness of breath.   Cardiovascular: Negative for chest pain, palpitations and leg swelling.  Gastrointestinal: Negative for abdominal distention, abdominal pain, constipation, diarrhea, nausea and vomiting.  Musculoskeletal: Negative.   Skin: Negative.   Neurological: Positive for dizziness.  Psychiatric/Behavioral: Positive for decreased concentration, dysphoric mood and sleep disturbance. The patient is nervous/anxious.     Objective:  Physical Exam Constitutional:      Appearance: He is well-developed.  HENT:     Head: Normocephalic and atraumatic.  Cardiovascular:     Rate and Rhythm: Normal rate and regular rhythm.  Pulmonary:     Effort: Pulmonary effort is normal. No respiratory distress.     Breath sounds: Normal breath sounds. No wheezing or rales.  Abdominal:     General: Bowel sounds are normal. There is no distension.     Palpations: Abdomen is soft.     Tenderness: There is no abdominal tenderness. There is no rebound.  Musculoskeletal:     Cervical back: Normal range of motion.  Skin:    General: Skin is warm and dry.  Neurological:     Mental Status: He is alert and oriented to person, place, and time.     Coordination: Coordination normal.     Vitals:    03/10/21 0834  BP: 138/80  Pulse: 87  Resp: 18  Temp: 98.2 F (36.8 C)  TempSrc: Oral  SpO2: 98%  Weight: 194 lb 6.4 oz (88.2 kg)  Height: 5\' 7"  (1.702 m)    This visit occurred during the SARS-CoV-2 public health emergency.  Safety protocols were in place, including screening questions prior to the visit, additional usage of staff PPE, and extensive cleaning of exam room while observing appropriate contact time as indicated for disinfecting solutions.   Assessment & Plan:

## 2021-03-12 NOTE — Assessment & Plan Note (Signed)
Checking HgA1c and adjust metformin 1000 mg daily as needed. He is taking statin and ARB.

## 2021-03-18 ENCOUNTER — Telehealth: Payer: Self-pay | Admitting: Internal Medicine

## 2021-03-18 NOTE — Telephone Encounter (Signed)
See below

## 2021-03-18 NOTE — Telephone Encounter (Signed)
His disability claim is based on mental health so I would recommend that the peer to peer be with his mental health provider.

## 2021-03-18 NOTE — Telephone Encounter (Signed)
Curtis Lam w/ Dr. Mollie Germany office called in regards to a disability claim. She said that the insurance is requesting a peer to peer between Dr. Okey Dupre and Dr. Fernande Boyden. She is needing a call back at [phone]. Please advise

## 2021-03-19 ENCOUNTER — Telehealth (HOSPITAL_COMMUNITY): Payer: Self-pay

## 2021-03-19 NOTE — Telephone Encounter (Signed)
Dr. Sherlon Handing office called requesting a peer-to-peer review for patient's disability. Dr. Sherlon Handing is an independent reviewer on behalf of [facility]. He requested a call back at [phone]. Please review and advise. Thank you.

## 2021-03-25 ENCOUNTER — Telehealth: Payer: Self-pay | Admitting: Internal Medicine

## 2021-03-25 NOTE — Telephone Encounter (Signed)
Dr.Rico called in regards to the patients disability application   His call back number: [phone]

## 2021-03-25 NOTE — Telephone Encounter (Signed)
Spoke with Dr. Terrial Rhodes office and they are aware that patient would need to call his mental health provider in regards to his disability paperwork. No other questions or concerns at this time.

## 2021-04-07 ENCOUNTER — Telehealth (HOSPITAL_COMMUNITY): Payer: Self-pay | Admitting: Psychiatry

## 2021-04-10 ENCOUNTER — Other Ambulatory Visit: Payer: Self-pay | Admitting: Internal Medicine

## 2021-04-15 ENCOUNTER — Other Ambulatory Visit: Payer: Self-pay | Admitting: Internal Medicine

## 2021-04-15 ENCOUNTER — Telehealth: Payer: Self-pay | Admitting: Internal Medicine

## 2021-04-15 ENCOUNTER — Encounter (HOSPITAL_COMMUNITY): Payer: Self-pay | Admitting: Psychiatry

## 2021-04-15 ENCOUNTER — Other Ambulatory Visit: Payer: Self-pay

## 2021-04-15 ENCOUNTER — Telehealth (INDEPENDENT_AMBULATORY_CARE_PROVIDER_SITE_OTHER): Payer: Self-pay | Admitting: Psychiatry

## 2021-04-15 DIAGNOSIS — R419 Unspecified symptoms and signs involving cognitive functions and awareness: Secondary | ICD-10-CM

## 2021-04-15 DIAGNOSIS — R41840 Attention and concentration deficit: Secondary | ICD-10-CM

## 2021-04-15 DIAGNOSIS — F411 Generalized anxiety disorder: Secondary | ICD-10-CM

## 2021-04-15 DIAGNOSIS — F331 Major depressive disorder, recurrent, moderate: Secondary | ICD-10-CM

## 2021-04-15 MED ORDER — BUPROPION HCL ER (SR) 150 MG PO TB12: 300.0000 mg | ORAL_TABLET | Freq: Every day | ORAL | 0 refills | Status: DC

## 2021-04-15 NOTE — Telephone Encounter (Signed)
We need a reason for the referral

## 2021-04-15 NOTE — Telephone Encounter (Signed)
  Spouse calling to request referral to [facility] Spouse states this was suggested by psychiatrist as well

## 2021-04-15 NOTE — Progress Notes (Signed)
BH MD/PA/NP OP Progress Note  04/15/2021 10:44 AM Curtis Lam  MRN:  [id number] Interview was conducted using videoconferencing application and I verified that I was speaking with the correct person using two identifiers. I discussed the limitations of evaluation and management by telemedicine and  the availability of in person appointments. Patient expressed understanding and agreed to proceed. Participants in the visit: patient (location - home); physician (location - home office).  Chief Complaint: Depression, anxiety, memory  HPI: 60yo married male with GAD/MDDwho hasalso been struggling with problems with focusing, forgetfulnesssince elementary school. He was previously seen by Dr.Pucilowska, who is no longer with . Today he reports doing the same, he continues to have trouble remembering things. He forgets to fill the bird feeder, he forgets to do things. He reports the disability people needed to speak with Dr.Pucilowska, but they were unable to get in touch with him. He has daily dizzy spells. All these symptoms started after his stroke in 2014 and vertigo. States he has no energy, no motivation to do anything. He was unable to enjoy his granddaughter.   He is unable to say if the wellbutrin and strattera help with concentration. Unable to say if they help his depression.He however takes the temazepam as needed, we discussed potential memory issues with this medication. Per Dr.Puclilowska, "We have added Wellbutrin (also for depressed mood) and Strattera and his focusing has improved.  He does not drive as he gets easily lost. He tried to use lists of things to do but it has not worked well. He has had CVA/TIAand family hx ofAlzheimer's disease (father). 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 He has been seen by Dr. Royston Sinner (neurologist)andlabs and brain MRI were not different from ones in  late 2020.He is on long term disability(and SSD)andand has decided not tolook for another job out of concern that his ongoing problems with STM will make it impossible to carry out well.His depression and anxiety continue and he has been having passing suicidal thoughts without intent or plan. He lives with wife who is aware of that. In the past he was in counseling but did not find it particularly helpful due to "not remembering what we talked about".   Visit Diagnosis:    ICD-10-CM   1. Generalized anxiety disorder  F41.1   2. Major depressive disorder, recurrent episode, moderate (HCC)  F33.1     Past Psychiatric History: Please see intake H&P.  Past Medical History:  Past Medical History:  Diagnosis Date  . Anxiety   . Depression   . Diabetes mellitus, type II (HCC)   . Elevated cholesterol   . History of migraine headaches    resolved s/p stopping ibuprofen  . Rectal polyp   . Schizoaffective disorder (HCC)   . 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 COLONOSCOPY  01/02/13  . TONSILLECTOMY    . VASECTOMY      Family Psychiatric History: Reviewed.  Family History:  Family History  Problem Relation Age of Onset  . Alzheimer's disease Father   . Depression Sister   . Diabetes Son 30  . Cancer Paternal Aunt        uterine, ovarian  . Diabetes Maternal Grandfather   . Heart disease Maternal Grandfather     Social History:  Social History   Socioeconomic History  . Marital status:  Married    Spouse name: Not on file  . Number of children: 2  . Years of education: Not on file  . Highest education level: High school graduate  Occupational History  . Occupation: Engineer, mining)    Employer: [employer]  Tobacco Use  . Smoking status: Never Smoker  . Smokeless tobacco: Never Used  Vaping Use  . Vaping Use: Never used  Substance and Sexual Activity  . Alcohol use: Yes     Alcohol/week: 1.0 standard drink    Types: 1 Cans of beer per week    Comment: 1 beer or wine 2-3 times per month, more in summer than winter  . Drug use: No  . Sexual activity: Yes    Partners: Female  Other Topics Concern  . Not on file  Social History Narrative   Lives with wife and son.  Other son lives in [place].  Wife suffers from depression, recent flare and hospitalization (12/2013); He lost his job 09/2014 for 4-5 weeks, and got new job   Social Determinants of Health   Financial Resource Strain: Not on file  Food Insecurity: Not on file  Transportation Needs: Not on file  Physical Activity: Not on file  Stress: Not on file  Social Connections: Not on file    Allergies: No Known Allergies  Metabolic Disorder Labs: Lab Results  Component Value Date   HGBA1C 7.3 (H) 03/10/2021   MPG 126 (H) 01/23/2015   MPG 120 (H) 12/25/2013   No results found for: PROLACTIN Lab Results  Component Value Date   CHOL 97 06/02/2019   TRIG 128.0 06/02/2019   HDL 39.70 06/02/2019   CHOLHDL 2 06/02/2019   VLDL 25.6 06/02/2019   LDLCALC 32 06/02/2019   LDLCALC 110 (H) 08/11/2017   Lab Results  Component Value Date   TSH 2.589 01/23/2015   TSH 2.765 12/25/2013    Therapeutic Level Labs: No results found for: LITHIUM No results found for: VALPROATE No components found for:  CBMZ  Current Medications: Current Outpatient Medications  Medication Sig Dispense Refill  . atomoxetine (STRATTERA) 100 MG capsule Take 1 capsule (100 mg total) by mouth daily. 90 capsule 1  . atorvastatin (LIPITOR) 40 MG tablet TAKE 1 TABLET BY MOUTH DAILY AT 6 PM. 30 tablet 0  . buPROPion (WELLBUTRIN SR) 150 MG 12 hr tablet Take 2 tablets (300 mg total) by mouth daily. 180 tablet 0  . ciclopirox (PENLAC) 8 % solution Apply topically at bedtime. Apply over nail and surrounding skin. Apply daily over previous coat. After seven (7) days, may remove with alcohol and continue cycle. 6.6 mL 4  . diclofenac Sodium  (VOLTAREN) 1 % GEL RUB IN 2 GRAMS ONTO AFFECTED AREA OF FOOT 2 TO 4 TIMES A DAY. 100 g 2  . gabapentin (NEURONTIN) 100 MG capsule TAKE 1 CAPSULE BY MOUTH AT BEDTIME 90 capsule 5  . losartan (COZAAR) 25 MG tablet Take 1 tablet (25 mg total) by mouth daily. 90 tablet 1  . metFORMIN (GLUCOPHAGE) 1000 MG tablet TAKE ONE TABLET BY MOUTH DAILY WITH BREAKFAST 90 tablet 3  . Methylcobalamin (B-12) 5000 MCG TBDP Take by mouth.    . mirtazapine (REMERON) 30 MG tablet Take 1 tablet (30 mg total) by mouth at bedtime. 90 tablet 0  . Multiple Vitamin (MULTIVITAMIN) tablet Take by mouth.    . Omega-3 Fatty Acids (FISH OIL) 1000 MG CAPS Take by mouth.    . QC ENTERIC ASPIRIN 325 MG EC tablet  0  . tamsulosin (FLOMAX) 0.4 MG CAPS capsule Take 0.4 mg by mouth daily.    . temazepam (RESTORIL) 30 MG capsule TAKE ONE CAPSULE BY MOUTH AT BEDTIME AS NEEDED FOR SLEEP 30 capsule 0  . Turmeric 450 MG CAPS Take 500 mg by mouth.      No current facility-administered medications for this visit.    Psychiatric Specialty Exam: Review of Systems  Psychiatric/Behavioral: Positive for decreased concentration, sleep disturbance and suicidal ideas. The patient is nervous/anxious.   All other systems reviewed and are negative.   There were no vitals taken for this visit.There is no height or weight on file to calculate BMI.  General Appearance: Casual and Fairly Groomed  Eye Contact:  Good  Speech:  Clear and Coherent and Normal Rate  Volume:  Normal  Mood:  Anxious and Depressed  Affect:  Congruent and Constricted  Thought Process:  Goal Directed  Orientation:  Full (Time, Place, and Person)  Thought Content: Rumination   Suicidal Thoughts:  Yes.  without intent/plan  Homicidal Thoughts:  No  Memory:  Immediate;   Poor Recent;   Poor Remote;   Good  Judgement:  Good  Insight:  Fair  Psychomotor Activity:  Normal  Concentration:  Concentration: Fair  Recall:  Poor  Fund of Knowledge: Good  Language: Good   Akathisia:  Negative  Handed:  Right  AIMS (if indicated): not done  Assets:  Communication Skills Desire for Improvement Financial Resources/Insurance Housing Social Support  ADL's:  Intact  Cognition: WNL  Sleep:  Fair   Screenings: GAD-7   Flowsheet Row Office Visit from 09/04/2019 in [facility] Office Visit from 06/02/2019 in [facility] Office Visit from 10/04/2018 in [facility]  Total GAD-7 Score 11 8 3     Mini-Mental   Flowsheet Row [facility] Visit [facility]/20/2019 in [facility]  Total Score (max 30 points ) 29    PHQ2-9   Flowsheet Row Video Visit from 02/25/2021 in [facility] Office Visit from 02/29/2020 in [facility] Visit from 09[facility]04/2019 in [facility] Office Visit from 06/02/2019 in [facility] Office Visit from 10/04/2018 in [facility]  PHQ-2 Total Score 4 5 3 4 2   PHQ-9 Total Score 14 18 12 13 8     Flowsheet Row Video Visit from 02/25/2021 in [facility]  C-SSRS RISK CATEGORY Low Risk        Assessment and Plan:Dx: MDD recurrent, moderate; AdultADD; GAD; Neurocognitive/Amnestic disorder  Plan: ContinueWellbutrin SR300 mg dailydose, discontinue temazepam 30 mg prn insomnia( to decrease memory issues), Strattera 100mg  in AMandcontinue mirtazapine at 30 mg at HS for both sleep and depression/anxiety. Next visit in2 months.The plan was discussed with patient who had an opportunity to ask questions and these were all answered. I spend62minutes in phone visit with the patient. I spent 20 minutes in chart review. Patient recommended to follow up with a neurologist to be assessed for his memory issues.   , MD 04/15/2021, 10:44 AM

## 2021-04-15 NOTE — Telephone Encounter (Signed)
See below

## 2021-04-16 ENCOUNTER — Encounter: Payer: Self-pay | Admitting: Counselor

## 2021-04-16 NOTE — Telephone Encounter (Signed)
  Follow up message  Spouse states reason for referral is memory loss, cognitive concerns She is requesting Dr Roseanne Reno at [facility]

## 2021-04-16 NOTE — Telephone Encounter (Signed)
Referral placed.

## 2021-04-16 NOTE — Telephone Encounter (Signed)
See below

## 2021-05-01 ENCOUNTER — Ambulatory Visit: Payer: Self-pay | Admitting: Podiatry

## 2021-05-09 ENCOUNTER — Telehealth: Payer: Self-pay | Admitting: Internal Medicine

## 2021-05-09 NOTE — Telephone Encounter (Signed)
Is this a partial message from somewhere else? I do not understand the message contents.

## 2021-05-09 NOTE — Telephone Encounter (Signed)
Unable to get in contact with the wife due to the phone going straight to VM. I will attempt to call back Monday.

## 2021-05-09 NOTE — Telephone Encounter (Signed)
Patients wife calling, states she spoke with the insurance and the patient will also need a validity test with neuro for their insurance to cover it. Patient spoke with [facility] and they said we will need to send a referral for that as well

## 2021-05-09 NOTE — Telephone Encounter (Signed)
See below

## 2021-05-14 NOTE — Telephone Encounter (Signed)
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 He is currently on social security disability but is in the process of applying for long-term disability. His wife stated that since Dr. Okey Dupre placed the referral for [facility] that she will need to place another referral to have the validity test done. Please advise

## 2021-05-14 NOTE — Telephone Encounter (Signed)
Correction: the phone number is [phone]

## 2021-05-14 NOTE — Telephone Encounter (Signed)
Patients wife called back. She can be reached at [phone]

## 2021-05-15 NOTE — Telephone Encounter (Signed)
He is already scheduled for neurospych testing in July. I do not feel anything further is needed from me. Corrie Dandy can you check with [facility] to see if another referral is needed. I do not know what validity testing is to the patient this is not the real name of any tests.

## 2021-05-21 ENCOUNTER — Telehealth (HOSPITAL_COMMUNITY): Payer: Self-pay | Admitting: *Deleted

## 2021-05-21 NOTE — Telephone Encounter (Signed)
Placed call to patient to inform of cancelled appoint.  Discussed need for patient to secure another provider and gave information regarding the resource letter that has been mailed.  Patient will need refills of some medications.  Left message for them to call for refills.

## 2021-05-29 ENCOUNTER — Telehealth (HOSPITAL_COMMUNITY): Payer: Self-pay | Admitting: *Deleted

## 2021-05-29 NOTE — Telephone Encounter (Signed)
Former pt of Dr. Hinton Dyer requesting refill of Remeron 30 mg. Rx last written on 02/25/21 for #90. Please send to [facility]. Pt has been sent letter.

## 2021-06-02 ENCOUNTER — Telehealth (HOSPITAL_COMMUNITY): Payer: Self-pay | Admitting: *Deleted

## 2021-06-02 MED ORDER — MIRTAZAPINE 30 MG PO TABS: 30.0000 mg | ORAL_TABLET | Freq: Every day | ORAL | 0 refills | Status: DC

## 2021-06-02 NOTE — Telephone Encounter (Signed)
Former pt of Dr. Hinton Dyer. Pt spouse, Curtis Lam, called requesting refilling Restoril. Last written on 01/15/21 #30. Pt did see Dr. Daleen Bo on 04/15/21 and she did not refill Restoril due to memory impairment. [name] says he also needs the Remeron because he is not sleeping and this has adverse effect for pt. Pt is scheduled to see NP at [facility] on 06/25/21 and wife is asking for enough of all meds until that appointment. They have received certified letter.

## 2021-06-02 NOTE — Telephone Encounter (Signed)
Done

## 2021-06-02 NOTE — Telephone Encounter (Signed)
Assessment and Plan:Dx: MDD recurrent, moderate; Adult  ADD; GAD; Neurocognitive/Amnestic disorder   Plan: Continue Wellbutrin SR 300 mg daily dose, discontinue temazepam 30 mg prn insomnia( to decrease memory issues), Strattera  100 mg in AM and continue mirtazapine at 30 mg at HS for both sleep and depression/anxiety.   See above note from Dr. Daleen Bo dated on April 15, 2021.  Thirty days bridge supply of Remeron 30 mg provided by [facility] at [facility]. Will not refill Restoril as patient has memory issues and it was d/c on last visit. Patient should keep appointment with new provider to discuss his meds.

## 2021-06-24 ENCOUNTER — Encounter: Payer: Self-pay | Admitting: Counselor

## 2021-06-30 ENCOUNTER — Encounter: Payer: Self-pay | Admitting: Counselor

## 2021-06-30 ENCOUNTER — Ambulatory Visit: Payer: Medicare Other | Admitting: Psychology

## 2021-06-30 ENCOUNTER — Other Ambulatory Visit: Payer: Self-pay

## 2021-06-30 ENCOUNTER — Ambulatory Visit (INDEPENDENT_AMBULATORY_CARE_PROVIDER_SITE_OTHER): Payer: Medicare Other | Admitting: Counselor

## 2021-06-30 DIAGNOSIS — F329 Major depressive disorder, single episode, unspecified: Secondary | ICD-10-CM | POA: Diagnosis not present

## 2021-06-30 DIAGNOSIS — F988 Other specified behavioral and emotional disorders with onset usually occurring in childhood and adolescence: Secondary | ICD-10-CM | POA: Diagnosis not present

## 2021-06-30 DIAGNOSIS — F32A Depression, unspecified: Secondary | ICD-10-CM

## 2021-06-30 DIAGNOSIS — R419 Unspecified symptoms and signs involving cognitive functions and awareness: Secondary | ICD-10-CM | POA: Diagnosis not present

## 2021-06-30 DIAGNOSIS — F09 Unspecified mental disorder due to known physiological condition: Secondary | ICD-10-CM
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
## 2021-06-30 NOTE — Progress Notes (Signed)
   Psychometrist Note   Cognitive testing was administered to Curtis Lam by Milana Kidney, B.S. (Technician) under the supervision of Alphonzo Severance, Psy.D., ABN. [name] was able to tolerate all test procedures. Dr. Nicole Kindred met with the patient as needed to manage any emotional reactions to the testing procedures. Rest breaks were offered.    The battery of tests administered was selected by Dr. Nicole Kindred with consideration to the patient's current level of functioning, the nature of his symptoms, emotional and behavioral responses during the interview, level of literacy, observed level of motivation/effort, and the nature of the referral question. This battery was communicated to the psychometrist. Communication between Dr. Nicole Kindred and the psychometrist was ongoing throughout the evaluation and Dr. Nicole Kindred was immediately accessible at all times. Dr. Nicole Kindred provided supervision to the technician on the date of this service, to the extent necessary to assure the quality of all services provided.    [name] will return in approximately one week for an interactive feedback session with Dr. Nicole Kindred, at which time test performance, clinical impressions, and treatment recommendations will be reviewed in detail. The patient understands he can contact our office should he require our assistance before this time.   A total of 120 minutes of billable time were spent with Curtis Lam by the technician, including test administration and scoring time. Billing for these services is reflected in Dr. Les Pou note.   This note reflects time spent with the psychometrician and does not include test scores, clinical history, or any interpretations made by Dr. Nicole Kindred. The full report will follow in a separate note.

## 2021-07-01 ENCOUNTER — Encounter: Payer: Self-pay | Admitting: Counselor

## 2021-07-01 NOTE — Progress Notes (Signed)
NEUROPSYCHOLOGICAL TEST SCORES [facility]  Patient Name: Curtis Lam MRN: [id number] Date of Birth: [birth year] Age: 60 y.o. Education: 12 years  Measurement properties of test scores: IQ, Index, and Standard Scores (SS): Mean = 100; Standard Deviation = 15 Scaled Scores (Ss): Mean = 10; Standard Deviation = 3 Z scores (Z): Mean = 0; Standard Deviation = 1 T scores (T); Mean = 50; Standard Deviation = 10  TEST SCORES:    Note: This summary of test scores accompanies the interpretive report and should not be interpreted by unqualified individuals or in isolation without reference to the report. Test scores are relative to age, gender, and educational history as available and appropriate.   Performance Validity        ACS: Raw Descriptor      Word Choice: 50 Within Expectation      MSVT: Raw Descriptor      IR 100 Within Expectation      DR 95 Within Expectation      CNS 95 Within Expectation      PA 80 ---      FR 40 ---      The Dot Counting Test: Raw Descriptor      E-Score 8 Within Expectation      Embedded Measures: Raw Descriptor      RBANS Effort Index: 0 Within Expectation      WAIS-IV Reliable Digit Span 10 Within Expectation      WAIS-IV Reliable Digit Span Revised 16 Within Expectation      Expected Functioning        Wide Range Achievement Test (Word Reading): Standard/Scaled Score Percentile       Word Reading 99 47      Reynolds Intellectual Screening Test Standard/T-score Percentile      Guess What 56 73      Odd Item Out 49 46  RIST Index 105 63      Cognitive Testing        RBANS, Form : Standard/Scaled Score Percentile  Total Score 87 19  Immediate Memory 78 7      List Learning 3 1      Story Memory 9 37  Visuospatial/Constructional 100 50      Figure Copy   (18) 10 50      Judgment of Line Orientation   (17) --- 51-75  Language 96 39      Picture Naming --- 51-75      Semantic Fluency 8 25  Attention 100 50      Digit Span  10 50      Coding 10 50  Delayed Memory 84 14      List Recall   (3) --- 10-16      List Recognition   (18) --- 10-16      Story Recall   (9) 10 50      Figure Recall   (11) 8 25      Wechsler Adult Intelligence Scale - IV: Standard/Scaled Score Percentile  Working Memory Index 95 37      Digit Span 9 37          Digit Span Forward 8 25          Digit Span Backward 10 50          Digit Span Sequencing 10 50      Arithmetic 9 37  Processing Speed Index 97 42      Symbol Search 11 63  Coding 8 25      Neuropsychological Assessment Battery (Language Module): T-score Percentile      Naming   (31) 56 73      Verbal Fluency: T-score Percentile      Controlled Oral Word Association (F-A-S) 54 66      Semantic Fluency (Animals) 52 58      Trail Making Test: T-Score Percentile      Part A 56 73      Part B 50 50      Modified Wisconsin Card Sorting Test (MWCST): Standard/T-Score Percentile      Number of Categories Correct 59 82      Number of Perseverative Errors 68 96      Number of Total Errors 55 69      Percent Perseverative Errors 66 95  Executive Function Composite 122 93      Boston Diagnostic Aphasia Exam: Raw Score Scaled Score      Complex Ideational Material 10 7      Rating Scales         Raw Score Descriptor  Beck Depression Inventory - II 27 Moderate  Beck Anxiety Inventory 31 Severe      Conners' Adult ADHD Rating Scale: Self-Report (Long Version) T-Score Percentile      Inattention/Memory Problems 65 93      Hyperactivity/Restlessness 48 42      Impulsivity/Emotional Lability 55 69      Problems with Self-Concept 66 95      DSM-IV Inattentive Symptoms 71 98      DSM-IV Hyperactive-Impulsive Symptoms 52 58      DSM-IV ADHD Symptoms Total 63 91      ADHD Index 60 84   Manu Rubey V. Roseanne Reno PsyD, ABN Clinical Neuropsychologist

## 2021-07-02 NOTE — Progress Notes (Signed)
NEUROPSYCHOLOGICAL EVALUATION [facility]  Patient Name: Curtis Lam MRN: [id number] Date of Birth: Dec 06, 1961 Age: 60 y.o. Education: 12 years  Clinical Impressions  Curtis Lam is a 60 y.o., right-hand dominant, married man with a complex history of multiple issues including psychiatric difficulties (carries diagnoses of ADHD, MDD, GAD), remote lacunar type infarctions in 2014 and 2019, and behavioral outbursts leading to problems keeping a job. He has had difficulties with inattentiveness, disorganization, and problems concentrating for most of his life and his wife thinks these difficulties are worse after his recent stroke. I do not have full records available or actual images from his most recent MRI but I see from review of medical records from Dr. Hyacinth Meeker (Neurologist) at [facility] that he had a small lacunar type infarct in the mid right centrum semiovale in 2019. 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 Pearlean Brownie).   Curtis Lam demonstrated neuropsychological test data that is for the most part reassuring. His performance on measures of presumed prior functioning fell at an average level and his overall cognitive performance was within reasonable expectations of that standard, falling at the margin of the low average and average ranges. He did demonstrate weak memory encoding due to difficulties with an unstructured word list, which may be the result of executive control and attention problems. He does not have any frank impairment in any area. On self-report screening measures of psychiatric symptoms he reported severe levels of anxiety and moderate levels of depressive symptomatology. On a self-report rating scale of ADHD symptoms, he reported levels of inattention/memory problems and DSM inattention symptoms that can be considered very much above average in addition to likely related problems with self-concept.    The possibility of some minor contribution from his history of stroke cannot be entirely ruled out, but it seems much more likely that his difficulties are due to psychiatric and developmental causes. Diagnostic hypothesis worthy of further exploration include depressive disorders, anxiety disorders, impulse control disorders, and I do find his history fairly convincing for ADHD. Psychiatric difficulties and difficulties maintaining employment are frequently encountered in individuals with ADHD. The extent of his impulse control problems is such that this condition likely warrants a separate diagnosis, even though impulsive behavior is also common in ADHD. Recommend that he follow up with psychiatry, as he is already doing. I will reassure him that his testing is unremarkable with respect to findings typically encountered in AD. His minimal family history of AD does not place him at appreciably greater risk than other members of the general population, most of whom have will have a similar history due to the high base rate of this condition.   Diagnostic Impressions: Attention-deficit/hyperactivity disorder, predominately inattentive type Impulse control disorder, not otherwise specified  Recommendations to be discussed with patient  Your performance and presentation on assessment today were consistent with fairly good cognitive test performance. This is reassuring, and mitigates concerns about very significant difficulties with memory and thinking as compared to other age and education matched peers. Nevertheless, you are reporting problems with memory and thinking and you did have some scattered low scores on memory testing that could be picking up on the issues you note day-to-day. I think that the best term for the type of problem you are having is executive control problems. Executive control is a higher order cognitive ability involved in regulating other cognitive resources. Much like the conductor  of an  orchestra coordinates multiple instruments to make music, executive capacities coordinate other lower-order skills (e.g., movement, language, attention) to form complex human behaviors. Individuals with executive control problems are often capable of doing most of the things they did before they were having problems, but they may not do so as effortlessly, efficiently, and consistently. These difficulties often manifest as problems tracking information, multitasking, and paying attention. Executive control problems often result in cognitive inefficiency and can present as "memory problems," because they decrease encoding and spontaneous retrieval of information.   In your case, I think that you probably do warrant a formal diagnosis of ADHD. Executive control problems are the most frequent difficulties encountered cognitively in individuals with ADHD. These include problems remembering to remember something, with organization, with staying on time, keeping track of multiple obligations, prioritizing and the like. There are often associated tendencies and characteristics, such as obsessiveness, because people with ADHD are aware of their tendencies to make careless mistakes and will attempt to compensate in rigid ways. Often times people with ADHD also have difficulties with self-concept, because ADHD is associated with problems performing on the job, lower levels of achievement and the like.   I do not think, however, that difficulties with ADHD are the only thing going on. You have had difficulties at dozens of jobs, typically because of "blowing up" and impulsive outbursts. I think that the degree of impulsiveness you report warrants a separate diagnosis. 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 This diagnosis could be further refined in  consultation with a psychiatrist or through a psychological evaluation.   You may also have a specific learning disability, you reported problems achieving despite adequate effort. This could be due to ADHD but could also be something separate. Often times learning disabilities are co-ocurring with ADHD.   I think that you also have depression and anxiety, which may be secondary to the above noted issues although also probably warrant a separate diagnosis at this point because they are significantly impactful as problems in their own right.   First, I think it is important that you establish in psychiatric care and stay with it. Psychiatric medication management is a cornerstone for treating both ADHD and depression/anxiety. You may also consider consulting with a specialist in ADHD. I have often used [facility] in the past who can be reached at [phone]. I would like to be honest with you that it is often difficult to ascertain with certainty what is due to ADHD, what is due to anxiety, and what is due to depression when all of these problems co-occur.   For problems with attention and concentration, consider the following recommendations: Build routines into your day and stick to them, doing the same thing at the same time helps your body get into a rhythm that makes it harder to forget something you need to do.  Use sticky notes, reminders, a calendar, or your smart phone to provide yourself with reminders, to do lists, and help track appointments.  When you need to do work for school or other things, create an environment that is conducive to that work. This may include putting electronic devices that can be distracting outside the room and working in an area that is quiet and free from distractions.  Break things down into smaller steps to help get started and stop yourself from feeling  overwhelmed.  Plan breaks throughout the day where you can get up and move even if it's  for just a few minutes at a time.  Focus your attention on only one thing and avoid multitasking. Although some people are better at it than others, nobody's task performance is as good as it could be when they are alternating attention between two different tasks.  Stay mindful throughout your day and monitor whether you are feeling overwhelmed or disorganized to identify problems before they happen.  Avoid working under time pressure when you may be more liable to make mistakes.   There are few things as disruptive to brain functioning as not getting a good night's sleep. For sleep, I recommend against using medications, which can have lingering sedating effects on the brain and rob your brain of restful REM sleep. Instead, consider trying some of the following sleep hygiene recommendations. They may not work at once and may take effort, but the effort you spend is likely to be rewarded with better sleep eventually:  Stick to a sleep schedule of the same bedtime and wake time even on the weekends, which can help to regulate your body's internal clock so that you fall asleep and stay asleep.  Practice a relaxing bedtime ritual (conducted away from bright lights) which will help separate your sleep from stimulating activities and prepare your body to fall asleep when you go to bed.  Avoid naps, especially in the afternoon.  Evaluate your room and create conditions that will promote sleep such as keeping it cool (between 60 - 67 degrees), quiet, and free from any lights. Consider using blackout curtains, a "white noise" generator, or fan that will help mask any noises that might prevent you from going to sleep or awaken you during the night.  Sleep on a comfortable mattress and pillows.  Avoid bright light in the evening and excessive use of portable electronic devices right before bed that may contain light frequencies that can contribute to sleep problems.  Avoid alcohol, cigarettes, or heavy meals in  the evening. If you must eat, consume a light snack 45 minutes before bed.  Use your bed only for sleep to strengthen the association between your bed and sleep.  If you can't go to sleep within 30 minutes, go into another room and do something relaxing until you feel tired. Then, come back and try to go to sleep again for 30 minutes and repeat until sleep is achieved.  Some people find over the counter melatonin to be helpful for sleep, which you could discuss with a pharmacist or prescribing provider.   I do not think that your stroke is a major player. The areas in which you had strokes are not typically very cognitively impactful. Your first stroke was in your medulla, part of the brain stem, which is responsible for basic non-cognitive functions. Your other stroke was in the white matter in the non-doiminant hemisphere of your brain. It was described as lacunar, which means it was probably small. This is also not a cortical area. I cannot rule out that it is having some impact but it is certainly not the sole cause of your problems and it also doesn't go well with the timeline, because you have had many of these difficulties for much of your life.   Regarding disability, I would defer to your psychiatric care providers, because your difficulties at work are primarily due to a psychiatric problem. I do not think your strokes are disabling from  a neuropsychological/cognitive perspective.   Test Findings  Test scores are summarized in additional documentation associated with this encounter. Test scores are relative to age, gender, and educational history as available and appropriate. There were no concerns about performance validity as all findings fell within normal expectations.   General Intellectual Functioning/Achievement:  Performance on single-word reading was average. Performance on the RIST index was also average with slightly stronger verbal as compared to visual performance. Average  presents as a reasonable standard of comparison for his cognitive test data.   Attention and Processing Efficiency: Performance on indicators of attention and working memory was reasonable, with an overall average range score. Performance on indicators of digit repetition forward, backward, and digit resequencing in ascending order was all average. Solving arithmetical word problems without paper and pencil was also average.   Timed measures of processing speed generated reasonable average range scores at an index level. He performed at an average level on two different timed number symbol coding procedures and on one measure emphasizing efficient visual matching and efficient visual scanning.   Language: Performance on measures of language function was good, with average verbal fluency and visual object confrontation naming.   Visuospatial Function: Indicators of visuospatial and constructional abilities suggested reasonable capacities with an overall average range score. Figure copy was average and judgment of angular line orientations was also average.   Learning and Memory: Performance on measures of learning and memory was within gross limits of normal although there was some relevant subtest level variability. Memory for structured was much better than for unstructured information, which can sometimes happy when executive control problems are atttentuating memory functioning.   In the verbal realm, he learned 3, 3, 5, and 5 words of a 10 - item word list across four learning trials, which is an extremely low level of immediate recall. This was followed by good retention, however, with a low average range score on delayed recall and comparable low average recognition. Performance for a short story was better with immediate immediate and delayed recall.   In the visual realm, Curtis Lam demonstrated average delayed recall of a modestly complex figure he was asked to copy.   Executive  Functions: Performance on executive measures was generally good although as above, the patient's presentation is suggestive of executive control problems as is his memory profile. On dedicated measures his [employer] was superior, on the [employer]utive Function Composite. Categories correct was high average and perseverative errors was very good, in the superior range, demonstrating good flexibility in response to feedback. Generation of words was average in response to the letters F-A-S. Alternating sequencing of numbers and letters of the alphabet was also unremarkable and in the average range.  Rating Scale(s): He screeded in the moderate range for depressive symptoms and the severe range for anxiety symptoms. Inspection of individual items shows a self-punitive, negative attitude towards himself. Curtis Lam also completed the Conners' Adult ADHD Rating Scale: Self- Long Form, which is a self-report inventory of ADHD symptoms. His consistency index was adequate, reducing the chance that the profile was produced by inconsistent or random responding. On factor analytically derived sub scales, he reported a level of inattention/memory problems and problems with self-concept that are likely related, both sets of symptoms can be considered very much above average. On specific symptom scales, he also reported a very much above average level of inattenative symptoms. He reported relatiely less in the way of hyperactivity symptoms.   Curtis BoeckPeter V. Roseanne RenoStewart, PsyD, ABN  Clinical Neuropsychologist  Coding and Compliance  Billing below reflects technician time, my direct face-to-face time with the patient, time spent in test administration, and time spent in professional activities including but not limited to: neuropsychological test interpretation, integration of neuropsychological test data with clinical history, report preparation, treatment planning, care coordination, and review of diagnostically  pertinent medical history or studies.   Services associated with this encounter: Clinical Interview [phone]) plus 165 minutes (96132/96133; Neuropsychological Evaluation by Professional)  120 minutes ([id number]/[id number]; Neuropsychological Testing by Technician)

## 2021-07-04 ENCOUNTER — Other Ambulatory Visit: Payer: Self-pay

## 2021-07-04 ENCOUNTER — Ambulatory Visit (INDEPENDENT_AMBULATORY_CARE_PROVIDER_SITE_OTHER): Payer: Medicare Other | Admitting: Internal Medicine

## 2021-07-04 ENCOUNTER — Encounter: Payer: Self-pay | Admitting: Internal Medicine

## 2021-07-04 VITALS — BP 132/80 | HR 84 | Temp 97.9°F | Resp 18 | Ht 67.0 in | Wt 179.4 lb

## 2021-07-04 DIAGNOSIS — F331 Major depressive disorder, recurrent, moderate: Secondary | ICD-10-CM

## 2021-07-04 DIAGNOSIS — E1169 Type 2 diabetes mellitus with other specified complication: Secondary | ICD-10-CM

## 2021-07-04 DIAGNOSIS — E785 Hyperlipidemia, unspecified: Secondary | ICD-10-CM

## 2021-07-04 DIAGNOSIS — L609 Nail disorder, unspecified: Secondary | ICD-10-CM | POA: Insufficient documentation

## 2021-07-04 DIAGNOSIS — Z0000 Encounter for general adult medical examination without abnormal findings: Secondary | ICD-10-CM | POA: Insufficient documentation

## 2021-07-04 DIAGNOSIS — R42 Dizziness and giddiness: Secondary | ICD-10-CM

## 2021-07-04 DIAGNOSIS — Z0001 Encounter for general adult medical examination with abnormal findings: Secondary | ICD-10-CM | POA: Diagnosis not present

## 2021-07-04 DIAGNOSIS — R29898 Other symptoms and signs involving the musculoskeletal system: Secondary | ICD-10-CM

## 2021-07-04 DIAGNOSIS — I10 Essential (primary) hypertension: Secondary | ICD-10-CM

## 2021-07-04 DIAGNOSIS — Z8673 Personal history of transient ischemic attack (TIA), and cerebral infarction without residual deficits: Secondary | ICD-10-CM

## 2021-07-04 DIAGNOSIS — I69398 Other sequelae of cerebral infarction: Secondary | ICD-10-CM

## 2021-07-04 LAB — CBC
HCT: 52.7 % — ABNORMAL HIGH (ref 39.0–52.0)
Hemoglobin: 17.6 g/dL — ABNORMAL HIGH (ref 13.0–17.0)
MCHC: 33.5 g/dL (ref 30.0–36.0)
MCV: 91.7 fl (ref 78.0–100.0)
Platelets: 176 10*3/uL (ref 150.0–400.0)
RBC: 5.74 Mil/uL (ref 4.22–5.81)
RDW: 13.5 % (ref 11.5–15.5)
WBC: 6.3 10*3/uL (ref 4.0–10.5)

## 2021-07-04 LAB — COMPREHENSIVE METABOLIC PANEL
ALT: 35 U/L (ref 0–53)
AST: 29 U/L (ref 0–37)
Albumin: 4.5 g/dL (ref 3.5–5.2)
Alkaline Phosphatase: 35 U/L — ABNORMAL LOW (ref 39–117)
BUN: 19 mg/dL (ref 6–23)
CO2: 29 mEq/L (ref 19–32)
Calcium: 9 mg/dL (ref 8.4–10.5)
Chloride: 103 mEq/L (ref 96–112)
Creatinine, Ser: 1.03 mg/dL (ref 0.40–1.50)
GFR: 77.91 mL/min (ref >60.00)
Glucose, Bld: 106 mg/dL — ABNORMAL HIGH (ref 70–99)
Potassium: 4.5 mEq/L (ref 3.5–5.1)
Sodium: 138 mEq/L (ref 135–145)
Total Bilirubin: 0.5 mg/dL (ref 0.2–1.2)
Total Protein: 7.3 g/dL (ref 6.0–8.3)

## 2021-07-04 LAB — VITAMIN D 25 HYDROXY (VIT D DEFICIENCY, FRACTURES): VITD: 63.97 ng/mL (ref 30.00–100.00)

## 2021-07-04 LAB — LIPID PANEL
Cholesterol: 172 mg/dL (ref 0–200)
HDL: 45.7 mg/dL (ref >39.00)
LDL Cholesterol: 105 mg/dL — ABNORMAL HIGH (ref 0–99)
NonHDL: 126.15
Total CHOL/HDL Ratio: 4
Triglycerides: 105 mg/dL (ref 0.0–149.0)
VLDL: 21 mg/dL (ref 0.0–40.0)

## 2021-07-04 LAB — VITAMIN B12: Vitamin B-12: 625 pg/mL (ref 211–911)

## 2021-07-04 LAB — TSH: TSH: 3.4 u[IU]/mL (ref 0.35–5.50)

## 2021-07-04 LAB — HEMOGLOBIN A1C: Hgb A1c MFr Bld: 6.7 % — ABNORMAL HIGH (ref 4.6–6.5)

## 2021-07-04 NOTE — Assessment & Plan Note (Signed)
Checking HgA1c. On ARB and statin. Reminded about eye exam which is due. Foot exam done. Adjust metformin 1000 mg daily as needed.

## 2021-07-04 NOTE — Progress Notes (Signed)
   Subjective:   Patient ID: Curtis Lam, male    DOB: 01/06/1961, 60 y.o.   MRN: [id number]  HPI The patient is a 60 YO man coming in for physical with acute concerns.  PMH, FMH, social history reviewed and updated  HPI #2: Here for persistent vertigo (going on for years, was referred to ENT several years ago and they cannot recall this, worse with turn of head, does not drive as a result, they feel started after stroke has maybe improved some, did some PT in hospital but not since), and some worsening weakness/clumsiness (going on recently, then they clarify several years, not acute, they are concerned given past stroke).  Review of Systems  Constitutional: Negative.   HENT: Negative.    Eyes: Negative.   Respiratory:  Negative for cough, chest tightness and shortness of breath.   Cardiovascular:  Negative for chest pain, palpitations and leg swelling.  Gastrointestinal:  Negative for abdominal distention, abdominal pain, constipation, diarrhea, nausea and vomiting.  Musculoskeletal: Negative.   Skin: Negative.   Neurological:  Positive for dizziness and weakness.  Psychiatric/Behavioral:  Positive for decreased concentration and dysphoric mood.    Objective:  Physical Exam Constitutional:      Appearance: He is well-developed.  HENT:     Head: Normocephalic and atraumatic.  Cardiovascular:     Rate and Rhythm: Normal rate and regular rhythm.  Pulmonary:     Effort: Pulmonary effort is normal. No respiratory distress.     Breath sounds: Normal breath sounds. No wheezing or rales.  Abdominal:     General: Bowel sounds are normal. There is no distension.     Palpations: Abdomen is soft.     Tenderness: There is no abdominal tenderness. There is no rebound.  Musculoskeletal:     Cervical back: Normal range of motion.  Skin:    General: Skin is warm and dry.  Neurological:     Mental Status: He is alert and oriented to person, place, and time.     Coordination:  Coordination normal.  Psychiatric:     Comments: Does look to wife for details about things.     Vitals:   07/04/21 0855  BP: 132/80  Pulse: 84  Resp: 18  Temp: 97.9 F (36.6 C)  TempSrc: Oral  SpO2: 98%  Weight: 179 lb 6.4 oz (81.4 kg)  Height: 5\' 7"  (1.702 m)    This visit occurred during the SARS-CoV-2 public health emergency.  Safety protocols were in place, including screening questions prior to the visit, additional usage of staff PPE, and extensive cleaning of exam room while observing appropriate contact time as indicated for disinfecting solutions.   Assessment & Plan:

## 2021-07-04 NOTE — Assessment & Plan Note (Signed)
Referring to neuro rehab for vestibular therapy to see if this helps. 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

## 2021-07-04 NOTE — Assessment & Plan Note (Signed)
BP at goal on losartan 25 mg daily. Checking CMP.

## 2021-07-04 NOTE — Assessment & Plan Note (Signed)
For nail brittleness checking TSH and vitamin D and vitamin B12. Treat as appropriate. If normal can try biotin.

## 2021-07-04 NOTE — Patient Instructions (Addendum)
We will check another MRI of the brain to see if there are any changes.   We will get you in with the vertigo rehab to try to reduce the vertigo.   We are checking the labs today.

## 2021-07-04 NOTE — Assessment & Plan Note (Signed)
Not well controlled and psych provider retired so they are in the midst of changing.

## 2021-07-04 NOTE — Assessment & Plan Note (Signed)
Diabetes which is well controlled and HTN controlled. Checking lipid panel.

## 2021-07-04 NOTE — Assessment & Plan Note (Signed)
Checking lipid panel and adjust lipitor 40 mg daily as needed. 

## 2021-07-04 NOTE — Assessment & Plan Note (Signed)
Flu shot yearly. Covid-19 discussed boosters. Pneumonia due declines. Shingrix 1st did not get 2nd. Tetanus up to date. Colonoscopy up to date. Counseled about sun safety and mole surveillance. Counseled about the dangers of distracted driving. Given 10 year screening recommendations.

## 2021-07-04 NOTE — Assessment & Plan Note (Signed)
They feel that this is worsening over the last few years. No recent MRI brain so ordered to rule out new stroke.

## 2021-07-08 ENCOUNTER — Ambulatory Visit (INDEPENDENT_AMBULATORY_CARE_PROVIDER_SITE_OTHER): Payer: Medicare Other | Admitting: Counselor

## 2021-07-08 ENCOUNTER — Other Ambulatory Visit: Payer: Self-pay

## 2021-07-08 ENCOUNTER — Encounter: Payer: Self-pay | Admitting: Counselor

## 2021-07-08 DIAGNOSIS — F900 Attention-deficit hyperactivity disorder, predominantly inattentive type: Secondary | ICD-10-CM | POA: Insufficient documentation

## 2021-07-08 DIAGNOSIS — F639 Impulse disorder, unspecified: Secondary | ICD-10-CM

## 2021-07-08 NOTE — Patient Instructions (Signed)
Your performance and presentation on assessment today were consistent with fairly good cognitive test performance. This is reassuring, and mitigates concerns about very significant difficulties with memory and thinking as compared to other age and education matched peers. Nevertheless, you are reporting problems with memory and thinking and you did have some scattered low scores on memory testing that could be picking up on the issues you note day-to-day. I think that the best term for the type of problem you are having is executive control problems. Executive control is a higher order cognitive ability involved in regulating other cognitive resources. Much like the conductor of an orchestra coordinates multiple instruments to make music, executive capacities coordinate other lower-order skills (e.g., movement, language, attention) to form complex human behaviors. Individuals with executive control problems are often capable of doing most of the things they did before they were having problems, but they may not do so as effortlessly, efficiently, and consistently. These difficulties often manifest as problems tracking information, multitasking, and paying attention. Executive control problems often result in cognitive inefficiency and can present as "memory problems," because they decrease encoding and spontaneous retrieval of information.   In your case, I think that you probably do warrant a formal diagnosis of ADHD. Executive control problems are the most frequent difficulties encountered cognitively in individuals with ADHD. These include problems remembering to remember something, with organization, with staying on time, keeping track of multiple obligations, prioritizing and the like. There are often associated tendencies and characteristics, such as obsessiveness, because people with ADHD are aware of their tendencies to make careless mistakes and will attempt to compensate in rigid ways. Often times people  with ADHD also have difficulties with self-concept, because ADHD is associated with problems performing on the job, lower levels of achievement and the like.   I do not think, however, that difficulties with ADHD are the only thing going on. You have had difficulties at dozens of jobs, typically because of "blowing up" and impulsive outbursts. I think that the degree of impulsiveness you report warrants a separate diagnosis. 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 This diagnosis could be further refined in consultation with a psychiatrist or through a psychological evaluation.   You may also have a specific learning disability, you reported problems achieving despite adequate effort. This could be due to ADHD but could also be something separate. Often times learning disabilities are co-ocurring with ADHD.   I think that you also have depression and anxiety, which may be secondary to the above noted issues although also probably warrant a separate diagnosis at this point because they are significantly impactful as problems in their own right.   First, I think it is important that you establish in psychiatric care and stay with it. Psychiatric medication management is a cornerstone for treating both ADHD and depression/anxiety. You may also consider consulting with a specialist in ADHD. I have often used [facility] in the past who can be reached at [phone]. I would like to be honest with you that it is often difficult to ascertain with certainty what is due to ADHD, what is due to anxiety, and what is due to depression when all of these problems co-occur.   For problems with attention and concentration, consider the following recommendations: Build routines into your day and stick to them, doing the same thing  at the same  time helps your body get into a rhythm that makes it harder to forget something you need to do. Use sticky notes, reminders, a calendar, or your smart phone to provide yourself with reminders, to do lists, and help track appointments. When you need to do work for school or other things, create an environment that is conducive to that work. This may include putting electronic devices that can be distracting outside the room and working in an area that is quiet and free from distractions. Break things down into smaller steps to help get started and stop yourself from feeling overwhelmed. Plan breaks throughout the day where you can get up and move even if it's for just a few minutes at a time. Focus your attention on only one thing and avoid multitasking. Although some people are better at it than others, nobody's task performance is as good as it could be when they are alternating attention between two different tasks. Stay mindful throughout your day and monitor whether you are feeling overwhelmed or disorganized to identify problems before they happen. Avoid working under time pressure when you may be more liable to make mistakes.   There are few things as disruptive to brain functioning as not getting a good night's sleep. For sleep, I recommend against using medications, which can have lingering sedating effects on the brain and rob your brain of restful REM sleep. Instead, consider trying some of the following sleep hygiene recommendations. They may not work at once and may take effort, but the effort you spend is likely to be rewarded with better sleep eventually:   Stick to a sleep schedule of the same bedtime and wake time even on the weekends, which can help to regulate your body's internal clock so that you fall asleep and stay asleep. Practice a relaxing bedtime ritual (conducted away from bright lights) which will help separate your sleep from stimulating activities and prepare your body to  fall asleep when you go to bed. Avoid naps, especially in the afternoon. Evaluate your room and create conditions that will promote sleep such as keeping it cool (between 60 - 67 degrees), quiet, and free from any lights. Consider using blackout curtains, a "white noise" generator, or fan that will help mask any noises that might prevent you from going to sleep or awaken you during the night. Sleep on a comfortable mattress and pillows. Avoid bright light in the evening and excessive use of portable electronic devices right before bed that may contain light frequencies that can contribute to sleep problems. Avoid alcohol, cigarettes, or heavy meals in the evening. If you must eat, consume a light snack 45 minutes before bed. Use your bed only for sleep to strengthen the association between your bed and sleep. If you can't go to sleep within 30 minutes, go into another room and do something relaxing until you feel tired. Then, come back and try to go to sleep again for 30 minutes and repeat until sleep is achieved. Some people find over the counter melatonin to be helpful for sleep, which you could discuss with a pharmacist or prescribing provider.    I do not think that your stroke is a major player. The areas in which you had strokes are not typically very cognitively impactful. Your first stroke was in your medulla, part of the brain stem, which is responsible for basic non-cognitive functions. Your other stroke was in the white matter in the non-doiminant hemisphere of your brain. It  was described as lacunar, which means it was probably small. This is also not a cortical area. I cannot rule out that it is having some impact but it is certainly not the sole cause of your problems and it also doesn't go well with the timeline, because you have had many of these difficulties for much of your life.   Regarding disability, I would defer to your psychiatric care providers, because your difficulties at work  are primarily due to a psychiatric problem. I do not think your strokes are disabling from a neuropsychological/cognitive perspective.

## 2021-07-08 NOTE — Progress Notes (Signed)
NEUROPSYCHOLOGY FEEDBACK NOTE [facility]  Feedback Note: I met with Curtis Lam to review the findings resulting from his neuropsychological evaluation. Since the last appointment, he has been about the same. Time was spent reviewing the impressions and recommendations that are detailed in the evaluation report. We discussed impression of attention/deficit-hyperactivity disorder and other psychiatric/developmental difficulties as likely explanatory, as reflected in the patient instructions. We reviewed his test data and clinical history, which are not concerning for AD. I also do not think his strokes are major players given Dr. Ammie Ferrier description although I did not view the images myself. I took time to explain the findings and answer all the patient's questions. I encouraged Mr. Divelbiss to contact me should he have any further questions or if further follow up is desired.   Current Medications and Medical History   Current Outpatient Medications  Medication Sig Dispense Refill   atomoxetine (STRATTERA) 100 MG capsule Take 1 capsule (100 mg total) by mouth daily. 90 capsule 1   atorvastatin (LIPITOR) 40 MG tablet TAKE 1 TABLET BY MOUTH DAILY AT 6 PM. 30 tablet 0   buPROPion (WELLBUTRIN SR) 150 MG 12 hr tablet Take 2 tablets (300 mg total) by mouth daily. 180 tablet 0   ciclopirox (PENLAC) 8 % solution Apply topically at bedtime. Apply over nail and surrounding skin. Apply daily over previous coat. After seven (7) days, may remove with alcohol and continue cycle. 6.6 mL 4   diclofenac Sodium (VOLTAREN) 1 % GEL RUB IN 2 GRAMS ONTO AFFECTED AREA OF FOOT 2 TO 4 TIMES A DAY. 100 g 2   gabapentin (NEURONTIN) 100 MG capsule TAKE 1 CAPSULE BY MOUTH AT BEDTIME 90 capsule 5   losartan (COZAAR) 25 MG tablet Take 1 tablet (25 mg total) by mouth daily. 90 tablet 1   metFORMIN (GLUCOPHAGE) 1000 MG tablet TAKE ONE TABLET BY MOUTH DAILY WITH BREAKFAST 90 tablet 3   mirtazapine (REMERON) 15 MG  tablet Take 15 mg by mouth at bedtime.     Multiple Vitamin (MULTIVITAMIN) tablet Take by mouth.     Omega-3 Fatty Acids (FISH OIL) 1000 MG CAPS Take by mouth.     QC ENTERIC ASPIRIN 325 MG EC tablet   0   tamsulosin (FLOMAX) 0.4 MG CAPS capsule Take 0.4 mg by mouth daily.     Turmeric 450 MG CAPS Take 500 mg by mouth.      No current facility-administered medications for this visit.    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 Speech was normal in rate, rhythm, volume, and prosody. Self-reported mood was "ok" and affect was dysphoric. Thought process was logical and goal oriented  and thought content was appropriate. There were no safety concerns identified at today's encounter, such as thoughts of harming self or others.   Plan  Feedback provided regarding the patient's neuropsychological evaluation. He was encouraged to follow up with his psychiatric care providers and I also sent a copy of his report to [facility], as requested by the patient. Curtis Lam was encouraged to contact me if  any questions arise or if further follow up is desired.   Viviano Simas Nicole Kindred, PsyD, ABN Clinical Neuropsychologist  Service(s) Provided at This Encounter: 31 minutes [phone]; Conjoint therapy with patient present)

## 2021-07-09 ENCOUNTER — Encounter: Payer: Self-pay | Admitting: Internal Medicine

## 2021-07-10 ENCOUNTER — Telehealth: Payer: Self-pay | Admitting: Internal Medicine

## 2021-07-10 MED ORDER — LOSARTAN POTASSIUM 25 MG PO TABS: 25.0000 mg | ORAL_TABLET | Freq: Every day | ORAL | 1 refills | Status: DC

## 2021-07-10 NOTE — Telephone Encounter (Signed)
   Patient requesting refill for losartan (COZAAR) 25 MG tablet  Pharmacy [address] [facility] - [place], [place] - [address]

## 2021-07-10 NOTE — Telephone Encounter (Signed)
Medication has been sent to the patient's pharmacy.  

## 2021-07-11 NOTE — Telephone Encounter (Signed)
   Patient requesting refill atorvastatin (LIPITOR) 40 MG tablet  Pharmacy [address] [facility] - [place], [place] - [address]

## 2021-07-15 ENCOUNTER — Other Ambulatory Visit: Payer: Self-pay

## 2021-07-15 ENCOUNTER — Telehealth (HOSPITAL_COMMUNITY): Payer: Self-pay | Admitting: Psychiatry

## 2021-07-15 MED ORDER — ATORVASTATIN CALCIUM 40 MG PO TABS: ORAL_TABLET | ORAL | 0 refills | Status: DC

## 2021-07-17 ENCOUNTER — Other Ambulatory Visit: Payer: Self-pay

## 2021-07-17 ENCOUNTER — Ambulatory Visit
Admission: RE | Admit: 2021-07-17 | Discharge: 2021-07-17 | Disposition: A | Discharge status: Home / Self Care | Payer: Medicare Other | Source: Ambulatory Visit | Attending: Internal Medicine | Admitting: Internal Medicine

## 2021-07-17 DIAGNOSIS — I69398 Other sequelae of cerebral infarction: Secondary | ICD-10-CM

## 2021-09-23 DIAGNOSIS — E230 Hypopituitarism: Secondary | ICD-10-CM | POA: Diagnosis not present

## 2021-09-23 DIAGNOSIS — R948 Abnormal results of function studies of other organs and systems: Secondary | ICD-10-CM | POA: Diagnosis not present

## 2021-09-30 DIAGNOSIS — R3916 Straining to void: Secondary | ICD-10-CM | POA: Diagnosis not present

## 2021-10-09 ENCOUNTER — Other Ambulatory Visit: Payer: Self-pay | Admitting: Podiatry

## 2021-10-13 DIAGNOSIS — E119 Type 2 diabetes mellitus without complications: Secondary | ICD-10-CM | POA: Diagnosis not present

## 2021-10-13 DIAGNOSIS — H5213 Myopia, bilateral: Secondary | ICD-10-CM | POA: Diagnosis not present

## 2021-10-13 LAB — HM DIABETES EYE EXAM

## 2021-10-23 ENCOUNTER — Other Ambulatory Visit: Payer: Self-pay | Admitting: Internal Medicine

## 2021-10-27 ENCOUNTER — Other Ambulatory Visit: Payer: Self-pay

## 2021-10-27 ENCOUNTER — Ambulatory Visit: Payer: Medicare Other | Admitting: Podiatry

## 2021-10-27 DIAGNOSIS — E1149 Type 2 diabetes mellitus with other diabetic neurological complication: Secondary | ICD-10-CM | POA: Diagnosis not present

## 2021-10-27 NOTE — Patient Instructions (Signed)
You can try a "biotin" supplement to help with the nails or a topical nail strengthener that you can purchase over the counter.   Gabapentin Capsules or Tablets What is this medication? GABAPENTIN (GA ba pen tin) treats nerve pain. It may also be used to prevent and control seizures in people with epilepsy. It works by calming overactive nerves in your body. This medicine may be used for other purposes; ask your health care provider or pharmacist if you have questions. COMMON BRAND NAME(S): Active-PAC with Gabapentin, Ascencion Dike, Gralise, Neurontin What should I tell my care team before I take this medication? They need to know if you have any of these conditions: Alcohol or substance use disorder Kidney disease Lung or breathing disease Suicidal thoughts, plans, or attempt; a previous suicide attempt by you or a family member An unusual or allergic reaction to gabapentin, other medications, foods, dyes, or preservatives Pregnant or trying to get pregnant Breast-feeding How should I use this medication? Take this medication by mouth with a glass of water. Follow the directions on the prescription label. You can take it with or without food. If it upsets your stomach, take it with food. Take your medication at regular intervals. Do not take it more often than directed. Do not stop taking except on your care team's advice. If you are directed to break the 600 or 800 mg tablets in half as part of your dose, the extra half tablet should be used for the next dose. If you have not used the extra half tablet within 28 days, it should be thrown away. A special MedGuide will be given to you by the pharmacist with each prescription and refill. Be sure to read this information carefully each time. Talk to your care team about the use of this medication in children. While this medication may be prescribed for children as young as 3 years for selected conditions, precautions do apply. Overdosage: If you think  you have taken too much of this medicine contact a poison control center or emergency room at once. NOTE: This medicine is only for you. Do not share this medicine with others. What if I miss a dose? If you miss a dose, take it as soon as you can. If it is almost time for your next dose, take only that dose. Do not take double or extra doses. What may interact with this medication? Alcohol Antihistamines for allergy, cough, and cold Certain medications for anxiety or sleep Certain medications for depression like amitriptyline, fluoxetine, sertraline Certain medications for seizures like phenobarbital, primidone Certain medications for stomach problems General anesthetics like halothane, isoflurane, methoxyflurane, propofol Local anesthetics like lidocaine, pramoxine, tetracaine Medications that relax muscles for surgery Opioid medications for pain Phenothiazines like chlorpromazine, mesoridazine, prochlorperazine, thioridazine This list may not describe all possible interactions. Give your health care provider a list of all the medicines, herbs, non-prescription drugs, or dietary supplements you use. Also tell them if you smoke, drink alcohol, or use illegal drugs. Some items may interact with your medicine. What should I watch for while using this medication? Visit your care team for regular checks on your progress. You may want to keep a record at home of how you feel your condition is responding to treatment. You may want to share this information with your care team at each visit. You should contact your care team if your seizures get worse or if you have any new types of seizures. Do not stop taking this medication or any of your  seizure medications unless instructed by your care team. Stopping your medication suddenly can increase your seizures or their severity. This medication may cause serious skin reactions. They can happen weeks to months after starting the medication. Contact your care  team right away if you notice fevers or flu-like symptoms with a rash. The rash may be red or purple and then turn into blisters or peeling of the skin. Or, you might notice a red rash with swelling of the face, lips or lymph nodes in your neck or under your arms. Wear a medical identification bracelet or chain if you are taking this medication for seizures. Carry a card that lists all your medications. This medication may affect your coordination, reaction time, or judgment. Do not drive or operate machinery until you know how this medication affects you. Sit up or stand slowly to reduce the risk of dizzy or fainting spells. Drinking alcohol with this medication can increase the risk of these side effects. Your mouth may get dry. Chewing sugarless gum or sucking hard candy, and drinking plenty of water may help. Watch for new or worsening thoughts of suicide or depression. This includes sudden changes in mood, behaviors, or thoughts. These changes can happen at any time but are more common in the beginning of treatment or after a change in dose. Call your care team right away if you experience these thoughts or worsening depression. If you become pregnant while using this medication, you may enroll in the [place] American Antiepileptic Drug Pregnancy Registry by calling [phone]. This registry collects information about the safety of antiepileptic medication use during pregnancy. What side effects may I notice from receiving this medication? Side effects that you should report to your care team as soon as possible: Allergic reactions or angioedema--skin rash, itching, hives, swelling of the face, eyes, lips, tongue, arms, or legs, trouble swallowing or breathing Rash, fever, and swollen lymph nodes Thoughts of suicide or self harm, worsening mood, feelings of depression Trouble breathing Unusual changes in mood or behavior in children after use such as difficulty concentrating, hostility, or  restlessness Side effects that usually do not require medical attention (report to your care team if they continue or are bothersome): Dizziness Drowsiness Nausea Swelling of ankles, feet, or hands Vomiting This list may not describe all possible side effects. Call your doctor for medical advice about side effects. You may report side effects to FDA at 1-800-FDA-1088. Where should I keep my medication? Keep out of reach of children and pets. Store at room temperature between 15 and 30 degrees C (59 and 86 degrees F). Get rid of any unused medication after the expiration date. This medication may cause accidental overdose and death if taken by other adults, children, or pets. To get rid of medications that are no longer needed or have expired: Take the medication to a medication take-back program. Check with your pharmacy or law enforcement to find a location. If you cannot return the medication, check the label or package insert to see if the medication should be thrown out in the garbage or flushed down the toilet. If you are not sure, ask your care team. If it is safe to put it in the trash, empty the medication out of the container. Mix the medication with cat litter, dirt, coffee grounds, or other unwanted substance. Seal the mixture in a bag or container. Put it in the trash. NOTE: This sheet is a summary. It may not cover all possible information. If you have questions  about this medicine, talk to your doctor, pharmacist, or health care provider.  2022 Elsevier/Gold Standard (2021-06-09 00:00:00)

## 2021-10-28 NOTE — Progress Notes (Signed)
Subjective: 60 year old male presents the office today for follow-up evaluation of neuropathy.  He states that he has failed gabapentin 100 mg at nighttime and he states this has been doing well for him and his symptoms are much improved.  Of note he did state he stop taking a lot of medications on his own including his antidepressant and anxiety medication as well as Flomax.  Objective: AAO x3, NAD DP/PT pulses palpable bilaterally, CRT less than 3 seconds Not able to elicit any area of pinpoint tenderness.  Flexor, extensor considered intact.  MMT 5/5.  Cavus foot type is present.  Sensation appears intact with Semmes Weinstein monofilament.   No pain with calf compression, swelling, warmth, erythema  Assessment: Neuropathy  Plan: -All treatment options discussed with the patient including all alternatives, risks, complications.  -Continue gabapentin as it is helping.  He has taken himself off all other medications.  He states he actually feels better coming off of the antidepressant, anxiety medication.  He needs to monitor this closely recommend follow-up with his primary care physician.  Discussed possibly Cymbalta to help with neuropathy as well if needed. -Patient encouraged to call the office with any questions, concerns, change in symptoms.   Vivi Barrack DPM

## 2022-01-02 ENCOUNTER — Other Ambulatory Visit: Payer: Self-pay | Admitting: Podiatry

## 2022-01-02 ENCOUNTER — Other Ambulatory Visit: Payer: Self-pay | Admitting: Internal Medicine

## 2022-01-02 NOTE — Telephone Encounter (Signed)
Please advise 

## 2022-01-12 ENCOUNTER — Other Ambulatory Visit: Payer: Self-pay | Admitting: Podiatry

## 2022-01-19 ENCOUNTER — Encounter: Payer: Self-pay | Admitting: Internal Medicine

## 2022-01-19 ENCOUNTER — Other Ambulatory Visit: Payer: Self-pay | Admitting: Internal Medicine

## 2022-01-19 DIAGNOSIS — E1169 Type 2 diabetes mellitus with other specified complication: Secondary | ICD-10-CM

## 2022-01-20 ENCOUNTER — Other Ambulatory Visit: Payer: Self-pay

## 2022-01-20 ENCOUNTER — Other Ambulatory Visit (INDEPENDENT_AMBULATORY_CARE_PROVIDER_SITE_OTHER): Payer: Medicare Other

## 2022-01-20 DIAGNOSIS — E1169 Type 2 diabetes mellitus with other specified complication: Secondary | ICD-10-CM | POA: Diagnosis not present

## 2022-01-20 LAB — LIPID PANEL
Cholesterol: 99 mg/dL (ref 0–200)
HDL: 40.9 mg/dL (ref >39.00)
LDL Cholesterol: 37 mg/dL (ref 0–99)
NonHDL: 57.81
Total CHOL/HDL Ratio: 2
Triglycerides: 102 mg/dL (ref 0.0–149.0)
VLDL: 20.4 mg/dL (ref 0.0–40.0)

## 2022-01-20 LAB — HEMOGLOBIN A1C: Hgb A1c MFr Bld: 10.2 % — ABNORMAL HIGH (ref 4.6–6.5)

## 2022-01-20 LAB — MICROALBUMIN / CREATININE URINE RATIO
Creatinine,U: 139.4 mg/dL
Microalb Creat Ratio: 2.3 mg/g (ref 0.0–30.0)
Microalb, Ur: 3.2 mg/dL — ABNORMAL HIGH (ref 0.0–1.9)

## 2022-01-26 ENCOUNTER — Encounter: Payer: Self-pay | Admitting: Internal Medicine

## 2022-01-26 ENCOUNTER — Other Ambulatory Visit: Payer: Self-pay

## 2022-01-26 ENCOUNTER — Ambulatory Visit (INDEPENDENT_AMBULATORY_CARE_PROVIDER_SITE_OTHER): Payer: Medicare Other | Admitting: Internal Medicine

## 2022-01-26 DIAGNOSIS — E1169 Type 2 diabetes mellitus with other specified complication: Secondary | ICD-10-CM

## 2022-01-26 MED ORDER — RYBELSUS 3 MG PO TABS: 3.0000 mg | ORAL_TABLET | Freq: Every day | ORAL | 0 refills | Status: DC

## 2022-01-26 MED ORDER — RYBELSUS 7 MG PO TABS: 7.0000 mg | ORAL_TABLET | Freq: Every day | ORAL | 2 refills | Status: DC

## 2022-01-26 NOTE — Assessment & Plan Note (Signed)
Moderate exacerbation with HgA1c 10.2 which is significantly worse than prior. Likely due to weight gain from medications. He is up about 15 pounds in 6 months. Will keep metformin 1000 mg daily. Add rybelsus 3 mg daily for 1 month then 7 mg daily for 2 months until return in 3 months.

## 2022-01-26 NOTE — Progress Notes (Signed)
° °  Subjective:   Patient ID: Curtis Lam, male    DOB: 1961/01/09, 61 y.o.   MRN: [id number]  HPI The patient is a 61 YO man coming in for follow up newly uncontrolled diabetes. Recent HgA1c 10.2 no change to diet is up some weight in the last year about 10-15 pounds. Some nausea and a lot of thirst in the evenings.   Review of Systems  Constitutional: Negative.   HENT: Negative.    Eyes: Negative.   Respiratory:  Negative for cough, chest tightness and shortness of breath.   Cardiovascular:  Negative for chest pain, palpitations and leg swelling.  Gastrointestinal:  Negative for abdominal distention, abdominal pain, constipation, diarrhea, nausea and vomiting.  Endocrine: Positive for polydipsia and polyuria.  Musculoskeletal: Negative.   Skin: Negative.   Neurological: Negative.   Psychiatric/Behavioral: Negative.     Objective:  Physical Exam Constitutional:      Appearance: He is well-developed.  HENT:     Head: Normocephalic and atraumatic.  Cardiovascular:     Rate and Rhythm: Normal rate and regular rhythm.  Pulmonary:     Effort: Pulmonary effort is normal. No respiratory distress.     Breath sounds: Normal breath sounds. No wheezing or rales.  Abdominal:     General: Bowel sounds are normal. There is no distension.     Palpations: Abdomen is soft.     Tenderness: There is no abdominal tenderness. There is no rebound.  Musculoskeletal:     Cervical back: Normal range of motion.  Skin:    General: Skin is warm and dry.  Neurological:     Mental Status: He is alert and oriented to person, place, and time.     Coordination: Coordination normal.    Vitals:   01/26/22 0853  BP: 130/64  Pulse: 82  Resp: 18  SpO2: 96%  Weight: 197 lb 12.8 oz (89.7 kg)  Height: 5\' 7"  (1.702 m)    This visit occurred during the SARS-CoV-2 public health emergency.  Safety protocols were in place, including screening questions prior to the visit, additional usage of staff PPE,  and extensive cleaning of exam room while observing appropriate contact time as indicated for disinfecting solutions.   Assessment & Plan:

## 2022-01-26 NOTE — Patient Instructions (Signed)
We will add rybelsus 3 mg daily. This is for the first 30 days and then we increase to 7 mg daily. We have sent in this dose as well to start taking mid-March when you run out of the 3 mg.

## 2022-03-17 ENCOUNTER — Other Ambulatory Visit: Payer: Self-pay | Admitting: Internal Medicine

## 2022-04-05 ENCOUNTER — Encounter: Payer: Self-pay | Admitting: Internal Medicine

## 2022-04-07 MED ORDER — METFORMIN HCL 1000 MG PO TABS: 1000.0000 mg | ORAL_TABLET | Freq: Two times a day (BID) | ORAL | 3 refills | Status: DC

## 2022-04-16 DIAGNOSIS — M79601 Pain in right arm: Secondary | ICD-10-CM | POA: Diagnosis not present

## 2022-04-16 DIAGNOSIS — M25511 Pain in right shoulder: Secondary | ICD-10-CM | POA: Diagnosis not present

## 2022-04-16 DIAGNOSIS — M542 Cervicalgia: Secondary | ICD-10-CM | POA: Diagnosis not present

## 2022-04-16 DIAGNOSIS — M25551 Pain in right hip: Secondary | ICD-10-CM | POA: Diagnosis not present

## 2022-04-24 ENCOUNTER — Other Ambulatory Visit: Payer: Self-pay | Admitting: Internal Medicine

## 2022-04-30 DIAGNOSIS — M24811 Other specific joint derangements of right shoulder, not elsewhere classified: Secondary | ICD-10-CM | POA: Diagnosis not present

## 2022-04-30 DIAGNOSIS — M25551 Pain in right hip: Secondary | ICD-10-CM | POA: Diagnosis not present

## 2022-04-30 DIAGNOSIS — M25511 Pain in right shoulder: Secondary | ICD-10-CM | POA: Diagnosis not present

## 2022-06-25 ENCOUNTER — Other Ambulatory Visit: Payer: Self-pay | Admitting: Internal Medicine

## 2022-07-07 ENCOUNTER — Other Ambulatory Visit: Payer: Self-pay | Admitting: Internal Medicine

## 2022-07-07 ENCOUNTER — Ambulatory Visit (INDEPENDENT_AMBULATORY_CARE_PROVIDER_SITE_OTHER): Payer: Medicare Other | Admitting: Internal Medicine

## 2022-07-07 ENCOUNTER — Encounter: Payer: Self-pay | Admitting: Internal Medicine

## 2022-07-07 VITALS — BP 122/84 | HR 71 | Resp 18 | Ht 67.0 in | Wt 189.4 lb

## 2022-07-07 DIAGNOSIS — E1169 Type 2 diabetes mellitus with other specified complication: Secondary | ICD-10-CM | POA: Diagnosis not present

## 2022-07-07 DIAGNOSIS — E785 Hyperlipidemia, unspecified: Secondary | ICD-10-CM

## 2022-07-07 DIAGNOSIS — K219 Gastro-esophageal reflux disease without esophagitis: Secondary | ICD-10-CM

## 2022-07-07 DIAGNOSIS — Z0001 Encounter for general adult medical examination with abnormal findings: Secondary | ICD-10-CM

## 2022-07-07 DIAGNOSIS — I7774 Dissection of vertebral artery: Secondary | ICD-10-CM

## 2022-07-07 DIAGNOSIS — I10 Essential (primary) hypertension: Secondary | ICD-10-CM | POA: Diagnosis not present

## 2022-07-07 DIAGNOSIS — Z8673 Personal history of transient ischemic attack (TIA), and cerebral infarction without residual deficits: Secondary | ICD-10-CM | POA: Diagnosis not present

## 2022-07-07 DIAGNOSIS — F331 Major depressive disorder, recurrent, moderate: Secondary | ICD-10-CM

## 2022-07-07 LAB — COMPREHENSIVE METABOLIC PANEL
ALT: 35 U/L (ref 0–53)
AST: 27 U/L (ref 0–37)
Albumin: 4.8 g/dL (ref 3.5–5.2)
Alkaline Phosphatase: 44 U/L (ref 39–117)
BUN: 21 mg/dL (ref 6–23)
CO2: 26 mEq/L (ref 19–32)
Calcium: 9.6 mg/dL (ref 8.4–10.5)
Chloride: 103 mEq/L (ref 96–112)
Creatinine, Ser: 0.78 mg/dL (ref 0.40–1.50)
GFR: 94.98 mL/min (ref >60.00)
Glucose, Bld: 127 mg/dL — ABNORMAL HIGH (ref 70–99)
Potassium: 4.8 mEq/L (ref 3.5–5.1)
Sodium: 139 mEq/L (ref 135–145)
Total Bilirubin: 0.4 mg/dL (ref 0.2–1.2)
Total Protein: 7.4 g/dL (ref 6.0–8.3)

## 2022-07-07 LAB — MICROALBUMIN / CREATININE URINE RATIO
Creatinine,U: 109.8 mg/dL
Microalb Creat Ratio: 1.6 mg/g (ref 0.0–30.0)
Microalb, Ur: 1.8 mg/dL (ref 0.0–1.9)

## 2022-07-07 LAB — HEMOGLOBIN A1C: Hgb A1c MFr Bld: 7.6 % — ABNORMAL HIGH (ref 4.6–6.5)

## 2022-07-07 MED ORDER — ROSUVASTATIN CALCIUM 40 MG PO TABS: 40.0000 mg | ORAL_TABLET | Freq: Every day | ORAL | 3 refills | Status: DC

## 2022-07-07 MED ORDER — FLUOXETINE HCL 10 MG PO CAPS: 10.0000 mg | ORAL_CAPSULE | Freq: Every day | ORAL | 0 refills | Status: DC

## 2022-07-07 MED ORDER — PANTOPRAZOLE SODIUM 40 MG PO TBEC: 40.0000 mg | DELAYED_RELEASE_TABLET | Freq: Every day | ORAL | 3 refills | Status: DC

## 2022-07-07 NOTE — Progress Notes (Signed)
Subjective:   Patient ID: Curtis Lam, male    DOB: 07-Mar-1961, 61 y.o.   MRN: [id number]  HPI Here for medicare wellness and physical, with new complaints. Please see A/P for status and treatment of chronic medical problems.   Diet: DM since diabetic Physical activity: sedentary Depression/mood screen: negative Hearing: intact to whispered voice, moderate loss, hearing aids not using Visual acuity: grossly normal with lens, performs annual eye exam  ADLs: capable Fall risk: none Home safety: good Cognitive evaluation: intact to orientation, naming, recall and repetition EOL planning: adv directives discussed  Flowsheet Row Office Visit from 07/07/2022 in [facility] at [employer]  PHQ-2 Total Score 2       Flowsheet Row Office Visit from 07/07/2022 in [facility] at [facility]  PHQ-9 Total Score 2         08/05/2016    1:15 PM 05/31/2017    3:43 PM 08/06/2017   10:46 AM 10/04/2018    3:36 PM 07/07/2022    9:08 AM  Fall Risk  Falls in the past year? No No No No 0  Was there an injury with Fall?     0  Fall Risk Category Calculator     0  Fall Risk Category     Low    I have personally reviewed and have noted 1. The patient's medical and social history - reviewed today no changes 2. Their use of alcohol, tobacco or illicit drugs 3. Their current medications and supplements 4. The patient's functional ability including ADL's, fall risks, home safety risks and hearing or visual impairment. 5. Diet and physical activities 6. Evidence for depression or mood disorders 7. Care team reviewed and updated 8.  The patient is not on an opioid pain medication.  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   HENT: Negative.    Eyes: Negative.   Respiratory:  Negative for cough, chest tightness and shortness of breath.   Cardiovascular:  Negative for chest pain, palpitations and leg swelling.  Gastrointestinal:  Negative for abdominal distention, abdominal pain, constipation, diarrhea, nausea and vomiting.       GERD  Musculoskeletal:  Positive for myalgias.  Skin: Negative.   Neurological: Negative.   Psychiatric/Behavioral:  Positive for decreased concentration and dysphoric mood.     Objective:  Physical Exam Constitutional:      Appearance: He is well-developed.  HENT:     Head: Normocephalic and atraumatic.  Cardiovascular:     Rate and Rhythm: Normal rate and regular rhythm.  Pulmonary:     Effort: Pulmonary effort is normal. No respiratory  distress.     Breath sounds: Normal breath sounds. No wheezing or rales.  Abdominal:     General: Bowel sounds are normal. There is no distension.     Palpations: Abdomen is soft.     Tenderness: There is no abdominal tenderness. There is no rebound.  Musculoskeletal:     Cervical back: Normal range of motion.  Skin:    General: Skin is warm and dry.     Comments: Foot exam done  Neurological:     Mental Status: He is alert and oriented to person, place, and time.     Coordination: Coordination normal.     Vitals:   07/07/22 0900  BP: 122/84  Pulse: 71  Resp: 18  SpO2: 94%  Weight: 189 lb 6.4 oz (85.9 kg)  Height: 5\' 7"  (1.702 m)    Assessment  & Plan:

## 2022-07-07 NOTE — Assessment & Plan Note (Signed)
Foot exam done, reminded about eye exam. Checking HgA1c and microalbumin to creatinine ratio and CMP today. He is unable to afford rybelsus due to cost (d[employer]nut [employer]) and we will pick a substitution depending on HgA1c results. Previously uncontrolled and is taking metformin 1000 mg BID. Is on statin and ARB.

## 2022-07-07 NOTE — Assessment & Plan Note (Addendum)
New/worsening problem. Rx protonix 40 mg daily and if not controlling symptoms may need referral to GI for endoscopy.

## 2022-07-07 NOTE — Assessment & Plan Note (Signed)
He is having muscle spasms and wishes to stop lipitor is willing to try crestor. Will stop lipitor 40 mg daily and rx crestor 40 mg daily. Will recheck lipid panel in 6 months. Goal LDL<100.

## 2022-07-07 NOTE — Assessment & Plan Note (Signed)
Flu shot yearly. Covid-19 counseled. Shingrix 1st done declines 2nd. Tetanus due 2024. Colonoscopy due 2024. Counseled about sun safety and mole surveillance. Counseled about the dangers of distracted driving. Given 10 year screening recommendations.

## 2022-07-07 NOTE — Assessment & Plan Note (Signed)
No further imaging required without change in symptoms. Prior imaging stable several times.

## 2022-07-07 NOTE — Assessment & Plan Note (Signed)
BP at goal on losartan 25 mg daily. Checking CMP and adjust as needed.  

## 2022-07-07 NOTE — Assessment & Plan Note (Signed)
He is having relapse of this and wishes to retry prozac. Rx prozac 10 mg daily today and he will contact us in about 4 weeks and we will increase dose if needed at that time. He does not currently have a psychiatrist at this time.

## 2022-07-07 NOTE — Patient Instructions (Signed)
We will check the labs today.   We have sent in crestor instead of the lipitor.   We have sent in protonix for the stomach to take daily.

## 2022-07-07 NOTE — Assessment & Plan Note (Signed)
No new stroke symptoms, still has intermittent dizziness.

## 2022-07-18 ENCOUNTER — Other Ambulatory Visit: Payer: Self-pay | Admitting: Podiatry

## 2022-07-30 ENCOUNTER — Other Ambulatory Visit: Payer: Self-pay | Admitting: Internal Medicine

## 2022-10-06 ENCOUNTER — Encounter: Payer: Self-pay | Admitting: Internal Medicine

## 2022-10-07 ENCOUNTER — Other Ambulatory Visit: Payer: Self-pay | Admitting: Internal Medicine

## 2022-10-19 ENCOUNTER — Encounter: Payer: Self-pay | Admitting: Internal Medicine

## 2022-10-19 ENCOUNTER — Ambulatory Visit (INDEPENDENT_AMBULATORY_CARE_PROVIDER_SITE_OTHER): Payer: Medicare Other | Admitting: Internal Medicine

## 2022-10-19 VITALS — BP 130/60 | HR 77 | Temp 98.1°F | Ht 65.0 in | Wt 191.0 lb

## 2022-10-19 DIAGNOSIS — R197 Diarrhea, unspecified: Secondary | ICD-10-CM

## 2022-10-19 DIAGNOSIS — I10 Essential (primary) hypertension: Secondary | ICD-10-CM

## 2022-10-19 DIAGNOSIS — Z23 Encounter for immunization: Secondary | ICD-10-CM | POA: Diagnosis not present

## 2022-10-19 DIAGNOSIS — E1169 Type 2 diabetes mellitus with other specified complication: Secondary | ICD-10-CM | POA: Diagnosis not present

## 2022-10-19 DIAGNOSIS — E785 Hyperlipidemia, unspecified: Secondary | ICD-10-CM

## 2022-10-19 LAB — LIPID PANEL
Cholesterol: 95 mg/dL (ref 0–200)
HDL: 41.9 mg/dL (ref >39.00)
LDL Cholesterol: 18 mg/dL (ref 0–99)
NonHDL: 53.1
Total CHOL/HDL Ratio: 2
Triglycerides: 176 mg/dL — ABNORMAL HIGH (ref 0.0–149.0)
VLDL: 35.2 mg/dL (ref 0.0–40.0)

## 2022-10-19 LAB — HEMOGLOBIN A1C: Hgb A1c MFr Bld: 7.6 % — ABNORMAL HIGH (ref 4.6–6.5)

## 2022-10-19 NOTE — Assessment & Plan Note (Signed)
Checking HgA1c and adjust metformin 1000 mg BID and not taking rybelsus due to cost. On ARB and statin. Adjust as needed.

## 2022-10-19 NOTE — Assessment & Plan Note (Signed)
BP at goal, he had episode of low BP with tipping head backwards. At this time average reading at goal or high end of normal. Will keep losartan 25 mg daily.

## 2022-10-19 NOTE — Patient Instructions (Signed)
We will check the labs today. 

## 2022-10-19 NOTE — Progress Notes (Signed)
   Subjective:   Patient ID: Curtis Lam, male    DOB: 01/02/1961, 61 y.o.   MRN: [id number]  Diarrhea  Pertinent negatives include no abdominal pain, coughing or vomiting.   The patient is a 61 YO man coming in for follow up.  Review of Systems  Constitutional: Negative.   HENT: Negative.    Eyes: Negative.   Respiratory:  Negative for cough, chest tightness and shortness of breath.   Cardiovascular:  Negative for chest pain, palpitations and leg swelling.  Gastrointestinal:  Positive for diarrhea. Negative for abdominal distention, abdominal pain, constipation, nausea and vomiting.  Musculoskeletal: Negative.   Skin: Negative.   Neurological:  Positive for dizziness.  Psychiatric/Behavioral: Negative.      Objective:  Physical Exam Constitutional:      Appearance: He is well-developed.  HENT:     Head: Normocephalic and atraumatic.  Cardiovascular:     Rate and Rhythm: Normal rate and regular rhythm.  Pulmonary:     Effort: Pulmonary effort is normal. No respiratory distress.     Breath sounds: Normal breath sounds. No wheezing or rales.  Abdominal:     General: Bowel sounds are normal. There is no distension.     Palpations: Abdomen is soft.     Tenderness: There is no abdominal tenderness. There is no rebound.  Musculoskeletal:     Cervical back: Normal range of motion.  Skin:    General: Skin is warm and dry.  Neurological:     Mental Status: He is alert and oriented to person, place, and time.     Coordination: Coordination normal.     Vitals:   10/19/22 1108  BP: 130/60  Pulse: 77  Temp: 98.1 F (36.7 C)  TempSrc: Oral  SpO2: 93%  Weight: 191 lb (86.6 kg)  Height: 5\' 5"  (1.651 m)    Assessment & Plan:  Flu shot given at visit

## 2022-10-19 NOTE — Assessment & Plan Note (Signed)
Checking lipid panel and adjust crestor 40 mg daily as needed for LDL goal <100.

## 2022-10-19 NOTE — Assessment & Plan Note (Signed)
Could have been due to prozac and he stopped this and diarrhea has stopped. We discussed metformin has been known to cause diarrhea and may be contributing. He will let us know if this resumes.

## 2022-10-27 ENCOUNTER — Ambulatory Visit: Payer: Medicare Other | Admitting: Podiatry

## 2022-10-27 DIAGNOSIS — E1149 Type 2 diabetes mellitus with other diabetic neurological complication: Secondary | ICD-10-CM | POA: Diagnosis not present

## 2022-10-27 NOTE — Progress Notes (Unsigned)
Subjective: Chief Complaint  Patient presents with   Diabetes    Diabetic A1c-6.7 BG- Not taking, Neuropathy is doing better on the right, TX: Gabapentin     Sensitivity is better. Still on gabapentin 100mg  at night. He is asking about coming off of this. He had a rash in between his toes previously however that resolved.   Objective: AAO x3, NAD DP/PT pulses palpable bilaterally, CRT less than 3 seconds  No pain with calf compression, swelling, warmth, erythema  Assessment:  Plan: -All treatment options discussed with the patient including all alternatives, risks, complications.  -he is going to stop the gabapentin and see how he does -Patient encouraged to call the office with any questions, concerns, change in symptoms.

## 2022-12-22 ENCOUNTER — Encounter: Payer: Self-pay | Admitting: Internal Medicine

## 2022-12-25 MED ORDER — ONETOUCH VERIO W/DEVICE KIT: PACK | 0 refills | Status: AC

## 2022-12-25 MED ORDER — ONETOUCH VERIO VI STRP: 1.0000 | ORAL_STRIP | Freq: Two times a day (BID) | 11 refills | Status: DC

## 2022-12-25 MED ORDER — ONETOUCH DELICA LANCETS 33G MISC: 0 refills | Status: AC

## 2022-12-25 NOTE — Telephone Encounter (Signed)
Patient called back and would like to know if this can be sent today.

## 2023-01-21 ENCOUNTER — Other Ambulatory Visit: Payer: Self-pay | Admitting: Internal Medicine

## 2023-02-02 ENCOUNTER — Encounter: Payer: Self-pay | Admitting: Internal Medicine

## 2023-02-02 ENCOUNTER — Ambulatory Visit (INDEPENDENT_AMBULATORY_CARE_PROVIDER_SITE_OTHER): Payer: Medicare Other | Admitting: Internal Medicine

## 2023-02-02 VITALS — BP 140/100 | HR 82 | Temp 98.3°F | Ht 69.0 in | Wt 191.0 lb

## 2023-02-02 DIAGNOSIS — E1169 Type 2 diabetes mellitus with other specified complication: Secondary | ICD-10-CM

## 2023-02-02 LAB — POCT GLYCOSYLATED HEMOGLOBIN (HGB A1C): HbA1c POC (<> result, manual entry): 8.8 % (ref 4.0–5.6)

## 2023-02-02 MED ORDER — OZEMPIC (1 MG/DOSE) 2 MG/1.5ML ~~LOC~~ SOPN: 1.0000 mg | PEN_INJECTOR | SUBCUTANEOUS | 0 refills | Status: AC

## 2023-02-02 MED ORDER — OZEMPIC (0.25 OR 0.5 MG/DOSE) 2 MG/1.5ML ~~LOC~~ SOPN: PEN_INJECTOR | SUBCUTANEOUS | 0 refills | Status: DC

## 2023-02-02 MED ORDER — OZEMPIC (2 MG/DOSE) 8 MG/3ML ~~LOC~~ SOPN: 2.0000 mg | PEN_INJECTOR | SUBCUTANEOUS | 6 refills | Status: DC

## 2023-02-02 NOTE — Assessment & Plan Note (Signed)
POC HgA1c done and mild exacerbation with uncontrolled at 8.8 today. Not taking metformin consistently due to not remembering and bowel issues. Will start ozempic standard titration and reduce metformin to 500 mg daily next 2-3 weeks with plans to stop once ozempic working to control sugars. He is motivated for change. Continue ARB and statin. Follow up 3 months.

## 2023-02-02 NOTE — Patient Instructions (Signed)
Take 1/2 metformin 1 time a day for next 2-3 weeks.   Start ozempic month 1 0.25 mg weekly  Month 2 0.5 mg weekly   Month 3 1 mg weekly  Month 4 and onwards 2 mg weekly

## 2023-02-02 NOTE — Progress Notes (Signed)
   Subjective:   Patient ID: Curtis Lam, male    DOB: 1961-04-07, 62 y.o.   MRN: [id number]  HPI The patient is a 62 YO man coming in for follow up. Blood sugars high at home to 200-300 sometimes. Is forgetting metformin at least 2-3 times a week and for several weeks recently. Is also getting bowel issues with this that are life limiting (going 6-8 times per day with little warning).   Review of Systems  Constitutional: Negative.   HENT: Negative.    Eyes: Negative.   Respiratory:  Negative for cough, chest tightness and shortness of breath.   Cardiovascular:  Negative for chest pain, palpitations and leg swelling.  Gastrointestinal:  Negative for abdominal distention, abdominal pain, constipation, diarrhea, nausea and vomiting.  Musculoskeletal: Negative.   Skin: Negative.   Neurological: Negative.   Psychiatric/Behavioral: Negative.      Objective:  Physical Exam Constitutional:      Appearance: He is well-developed.  HENT:     Head: Normocephalic and atraumatic.  Cardiovascular:     Rate and Rhythm: Normal rate and regular rhythm.  Pulmonary:     Effort: Pulmonary effort is normal. No respiratory distress.     Breath sounds: Normal breath sounds. No wheezing or rales.  Abdominal:     General: Bowel sounds are normal. There is no distension.     Palpations: Abdomen is soft.     Tenderness: There is no abdominal tenderness. There is no rebound.  Musculoskeletal:     Cervical back: Normal range of motion.  Skin:    General: Skin is warm and dry.  Neurological:     Mental Status: He is alert and oriented to person, place, and time.     Coordination: Coordination normal.     Vitals:   02/02/23 0827  BP: (!) 140/100  Pulse: 82  Temp: 98.3 F (36.8 C)  TempSrc: Oral  SpO2: 95%  Weight: 191 lb (86.6 kg)  Height: 5\' 9"  (1.753 m)    Assessment & Plan:

## 2023-03-04 ENCOUNTER — Encounter: Payer: Self-pay | Admitting: Internal Medicine

## 2023-03-05 MED ORDER — OZEMPIC (0.25 OR 0.5 MG/DOSE) 2 MG/1.5ML ~~LOC~~ SOPN: 0.5000 mg | PEN_INJECTOR | SUBCUTANEOUS | 0 refills | Status: DC

## 2023-03-24 ENCOUNTER — Encounter: Payer: Self-pay | Admitting: Internal Medicine

## 2023-03-24 DIAGNOSIS — Z91128 Patient's intentional underdosing of medication regimen for other reason: Secondary | ICD-10-CM | POA: Diagnosis not present

## 2023-03-24 DIAGNOSIS — I1A0 Resistant hypertension: Secondary | ICD-10-CM | POA: Diagnosis not present

## 2023-03-24 DIAGNOSIS — H53131 Sudden visual loss, right eye: Secondary | ICD-10-CM | POA: Diagnosis not present

## 2023-03-24 DIAGNOSIS — G453 Amaurosis fugax: Secondary | ICD-10-CM | POA: Diagnosis not present

## 2023-03-24 DIAGNOSIS — Z7985 Long-term (current) use of injectable non-insulin antidiabetic drugs: Secondary | ICD-10-CM | POA: Diagnosis not present

## 2023-03-24 DIAGNOSIS — E119 Type 2 diabetes mellitus without complications: Secondary | ICD-10-CM | POA: Diagnosis not present

## 2023-03-24 DIAGNOSIS — I69820 Aphasia following other cerebrovascular disease: Secondary | ICD-10-CM | POA: Diagnosis not present

## 2023-03-24 DIAGNOSIS — G459 Transient cerebral ischemic attack, unspecified: Secondary | ICD-10-CM | POA: Diagnosis not present

## 2023-03-24 DIAGNOSIS — T383X6A Underdosing of insulin and oral hypoglycemic [antidiabetic] drugs, initial encounter: Secondary | ICD-10-CM | POA: Diagnosis not present

## 2023-03-24 DIAGNOSIS — I340 Nonrheumatic mitral (valve) insufficiency: Secondary | ICD-10-CM | POA: Diagnosis not present

## 2023-03-24 DIAGNOSIS — E1165 Type 2 diabetes mellitus with hyperglycemia: Secondary | ICD-10-CM | POA: Diagnosis not present

## 2023-03-24 DIAGNOSIS — R29700 NIHSS score 0: Secondary | ICD-10-CM | POA: Diagnosis not present

## 2023-03-24 DIAGNOSIS — H547 Unspecified visual loss: Secondary | ICD-10-CM | POA: Diagnosis not present

## 2023-03-24 DIAGNOSIS — I6502 Occlusion and stenosis of left vertebral artery: Secondary | ICD-10-CM | POA: Diagnosis not present

## 2023-03-24 DIAGNOSIS — E785 Hyperlipidemia, unspecified: Secondary | ICD-10-CM | POA: Diagnosis not present

## 2023-03-24 DIAGNOSIS — I6521 Occlusion and stenosis of right carotid artery: Secondary | ICD-10-CM | POA: Diagnosis not present

## 2023-03-24 DIAGNOSIS — H539 Unspecified visual disturbance: Secondary | ICD-10-CM | POA: Diagnosis not present

## 2023-03-24 DIAGNOSIS — Z8673 Personal history of transient ischemic attack (TIA), and cerebral infarction without residual deficits: Secondary | ICD-10-CM | POA: Diagnosis not present

## 2023-03-24 DIAGNOSIS — I358 Other nonrheumatic aortic valve disorders: Secondary | ICD-10-CM | POA: Diagnosis not present

## 2023-03-24 DIAGNOSIS — R0609 Other forms of dyspnea: Secondary | ICD-10-CM | POA: Diagnosis not present

## 2023-03-24 DIAGNOSIS — H538 Other visual disturbances: Secondary | ICD-10-CM | POA: Diagnosis not present

## 2023-03-24 DIAGNOSIS — Z7982 Long term (current) use of aspirin: Secondary | ICD-10-CM | POA: Diagnosis not present

## 2023-03-24 DIAGNOSIS — I10 Essential (primary) hypertension: Secondary | ICD-10-CM | POA: Diagnosis not present

## 2023-03-24 DIAGNOSIS — I6523 Occlusion and stenosis of bilateral carotid arteries: Secondary | ICD-10-CM | POA: Diagnosis not present

## 2023-03-24 DIAGNOSIS — Z79899 Other long term (current) drug therapy: Secondary | ICD-10-CM | POA: Diagnosis not present

## 2023-03-24 DIAGNOSIS — I3481 Nonrheumatic mitral (valve) annulus calcification: Secondary | ICD-10-CM | POA: Diagnosis not present

## 2023-03-25 ENCOUNTER — Encounter: Payer: Self-pay | Admitting: Internal Medicine

## 2023-03-25 DIAGNOSIS — G459 Transient cerebral ischemic attack, unspecified: Secondary | ICD-10-CM | POA: Diagnosis not present

## 2023-03-25 DIAGNOSIS — I6521 Occlusion and stenosis of right carotid artery: Secondary | ICD-10-CM | POA: Diagnosis not present

## 2023-03-25 DIAGNOSIS — H539 Unspecified visual disturbance: Secondary | ICD-10-CM | POA: Diagnosis not present

## 2023-03-25 DIAGNOSIS — I1A0 Resistant hypertension: Secondary | ICD-10-CM | POA: Diagnosis not present

## 2023-03-25 DIAGNOSIS — G453 Amaurosis fugax: Secondary | ICD-10-CM | POA: Diagnosis not present

## 2023-03-25 DIAGNOSIS — I6523 Occlusion and stenosis of bilateral carotid arteries: Secondary | ICD-10-CM | POA: Diagnosis not present

## 2023-03-26 DIAGNOSIS — I340 Nonrheumatic mitral (valve) insufficiency: Secondary | ICD-10-CM | POA: Diagnosis not present

## 2023-03-26 DIAGNOSIS — I3481 Nonrheumatic mitral (valve) annulus calcification: Secondary | ICD-10-CM | POA: Diagnosis not present

## 2023-03-26 DIAGNOSIS — G453 Amaurosis fugax: Secondary | ICD-10-CM | POA: Diagnosis not present

## 2023-03-26 DIAGNOSIS — I358 Other nonrheumatic aortic valve disorders: Secondary | ICD-10-CM | POA: Diagnosis not present

## 2023-03-26 DIAGNOSIS — I6521 Occlusion and stenosis of right carotid artery: Secondary | ICD-10-CM | POA: Diagnosis not present

## 2023-03-27 DIAGNOSIS — I6521 Occlusion and stenosis of right carotid artery: Secondary | ICD-10-CM | POA: Diagnosis not present

## 2023-03-28 DIAGNOSIS — I6521 Occlusion and stenosis of right carotid artery: Secondary | ICD-10-CM | POA: Diagnosis not present

## 2023-03-29 DIAGNOSIS — I6521 Occlusion and stenosis of right carotid artery: Secondary | ICD-10-CM | POA: Diagnosis not present

## 2023-03-30 DIAGNOSIS — I6521 Occlusion and stenosis of right carotid artery: Secondary | ICD-10-CM | POA: Diagnosis not present

## 2023-04-02 ENCOUNTER — Encounter: Payer: Self-pay | Admitting: *Deleted

## 2023-04-02 ENCOUNTER — Telehealth: Payer: Self-pay | Admitting: *Deleted

## 2023-04-02 NOTE — Transitions of Care (Post Inpatient/ED Visit) (Signed)
   04/02/2023  Name: Curtis Lam MRN: [id number] DOB: 12/02/1961  Today's TOC FU Call Status: Today's TOC FU Call Status:: Successful TOC FU Call Competed TOC FU Call Complete Date: 04/02/23  Transition Care Management Follow-up Telephone Call Date of Discharge: 03/31/23 Discharge Facility: Other ([facility]) Name of Other ([facility]) Discharge Facility: [facility]  Type of Discharge: Inpatient Admission Primary Inpatient Discharge Diagnosis:: transient loss of vision; (R) carotid endarterectomy How have you been since you were released from the hospital?: Better ("I am doing fine; no questions or concerns.  Pretty much independent in all my care, but my wife is right here helping if I need anything") Any questions or concerns?: No  Items Reviewed: Did you receive and understand the discharge instructions provided?: Yes (thoroughly reviewed with patient who verbalizes good understanding of same) Medications obtained and verified?: Yes (Medications Reviewed) (Full medication review completed; no concerns or discrepancies identified; confirmed patient obtained/ is taking all newly Rx'd medications as instructed; self-manages medications and denies questions/ concerns around medications today) Any new allergies since your discharge?: No Dietary orders reviewed?: Yes Type of Diet Ordered:: Heart Healthy, low salt Do you have support at home?: Yes People in Home: spouse Name of Support/Comfort Primary Source: Reports independent in self-care activities; spouse assists as/ if needed/ indicated  Home Care and Equipment/Supplies: Were Home Health Services Ordered?: No Any new equipment or medical supplies ordered?: No  Functional Questionnaire: Do you need assistance with bathing/showering or dressing?: No Do you need assistance with meal preparation?: No Do you need assistance with eating?: No Do you have difficulty maintaining continence: No Do you need assistance with  getting out of bed/getting out of a chair/moving?: No Do you have difficulty managing or taking your medications?: No (wife assists)  Follow up appointments reviewed: PCP Follow-up appointment confirmed?: Yes Date of PCP follow-up appointment?: 04/05/23 Follow-up Provider: PCP Specialist Hospital Follow-up appointment confirmed?: No Reason Specialist Follow-Up Not Confirmed: Patient has Specialist Provider Number and will Call for Appointment Do you need transportation to your follow-up appointment?: No Do you understand care options if your condition(s) worsen?: Yes-patient verbalized understanding  SDOH Interventions Today    Flowsheet Row Most Recent Value  SDOH Interventions   Food Insecurity Interventions Intervention Not Indicated  Transportation Interventions Intervention Not Indicated  [drives self at baseline,  spouse providing transportation post-recent surgery]      TOC Interventions Today    Flowsheet Row Most Recent Value  TOC Interventions   TOC Interventions Discussed/Reviewed TOC Interventions Discussed  [Patient declines need for ongoing/ further care coordination outreach,  no care coordination needs identified at time of TOC call today,  provided my direct contact information should questions/ concerns/ needs arise post-TOC call]      Interventions Today    Flowsheet Row Most Recent Value  Chronic Disease   Chronic disease during today's visit Other  [(R) carotid endarterectomy]  General Interventions   General Interventions Discussed/Reviewed General Interventions Discussed, Doctor Visits  Doctor Visits Discussed/Reviewed Specialist, Doctor Visits Discussed, PCP  PCP/Specialist Visits Compliance with follow-up visit  Exercise Interventions   Exercise Discussed/Reviewed Exercise Discussed  [post-op activity instructions]  Nutrition Interventions   Nutrition Discussed/Reviewed Nutrition Discussed  Pharmacy Interventions   Pharmacy Dicussed/Reviewed  Pharmacy Topics Discussed  [Full medication review with updating medication list in EHR per patient report]      Caryl Pina, RN, BSN, CCRN Alumnus RN CM Care Coordination/ Transition of Care- [facility] Care Management [phone]: direct office

## 2023-04-02 NOTE — Transitions of Care (Post Inpatient/ED Visit) (Signed)
   04/02/2023  Name: Curtis Lam MRN: [id number] DOB: 01-11-1961  Today's TOC FU Call Status: Today's TOC FU Call Status:: Unsuccessul Call (1st Attempt) Unsuccessful Call (1st Attempt) Date: 04/02/23  Attempted to reach the patient regarding the most recent Inpatient visit; left HIPAA compliant voice message requesting call back  Follow Up Plan: Additional outreach attempts will be made to reach the patient to complete the Transitions of Care (Post Inpatient visit) call.   Caryl Pina, RN, BSN, CCRN Alumnus RN CM Care Coordination/ Transition of Care- [facility] Care Management [phone]: direct office

## 2023-04-05 ENCOUNTER — Encounter: Payer: Self-pay | Admitting: Internal Medicine

## 2023-04-05 ENCOUNTER — Ambulatory Visit (INDEPENDENT_AMBULATORY_CARE_PROVIDER_SITE_OTHER): Payer: Medicare Other | Admitting: Internal Medicine

## 2023-04-05 VITALS — BP 134/80 | HR 84 | Temp 98.7°F | Ht 67.0 in | Wt 183.0 lb

## 2023-04-05 DIAGNOSIS — E1169 Type 2 diabetes mellitus with other specified complication: Secondary | ICD-10-CM

## 2023-04-05 DIAGNOSIS — I10 Essential (primary) hypertension: Secondary | ICD-10-CM

## 2023-04-05 DIAGNOSIS — R0609 Other forms of dyspnea: Secondary | ICD-10-CM | POA: Insufficient documentation

## 2023-04-05 DIAGNOSIS — E785 Hyperlipidemia, unspecified: Secondary | ICD-10-CM

## 2023-04-05 NOTE — Assessment & Plan Note (Signed)
Checking CMP in 2 weeks as he is starting back on losartan today. Sugars are running good and labs reviewed from recent hospital visit. Continue metformin 1000 mg BID.

## 2023-04-05 NOTE — Progress Notes (Signed)
   Subjective:   Patient ID: Curtis Lam, male    DOB: June 24, 1961, 62 y.o.   MRN: [id number]  HPI The patient is a 62 YO man coming in for follow up carotid endarterectomy right sided about 6 days ago. BP was low in the hospital and now is running 140-150 at home.   PMH, [facility], social history reviewed and updated  Review of Systems  Constitutional:  Positive for fatigue.  HENT: Negative.    Eyes: Negative.        Some blurriness to vision  Respiratory:  Negative for cough, chest tightness and shortness of breath.   Cardiovascular:  Negative for chest pain, palpitations and leg swelling.  Gastrointestinal:  Negative for abdominal distention, abdominal pain, constipation, diarrhea, nausea and vomiting.  Musculoskeletal: Negative.   Skin: Negative.   Neurological: Negative.   Psychiatric/Behavioral: Negative.      Objective:  Physical Exam Constitutional:      Appearance: He is well-developed.  HENT:     Head: Normocephalic and atraumatic.  Cardiovascular:     Rate and Rhythm: Normal rate and regular rhythm.  Pulmonary:     Effort: Pulmonary effort is normal. No respiratory distress.     Breath sounds: Normal breath sounds. No wheezing or rales.  Abdominal:     General: Bowel sounds are normal. There is no distension.     Palpations: Abdomen is soft.     Tenderness: There is no abdominal tenderness. There is no rebound.  Musculoskeletal:     Cervical back: Normal range of motion.  Skin:    General: Skin is warm and dry.  Neurological:     Mental Status: He is alert and oriented to person, place, and time.     Coordination: Coordination normal.     Vitals:   04/05/23 0900  BP: 134/80  Pulse: 84  Temp: 98.7 F (37.1 C)  TempSrc: Oral  SpO2: 99%  Weight: 183 lb (83 kg)  Height: 5\' 9"  (1.753 m)    Assessment & Plan:  Visit time 20 minutes in face to face communication with patient and coordination of care, additional 10 minutes spent in record review,  coordination or care, ordering tests, communicating/referring to other healthcare professionals, documenting in medical records all on the same day of the visit for total time 30 minutes spent on the visit.

## 2023-04-05 NOTE — Patient Instructions (Addendum)
We will have you start the losartan again.  Come back for labs in about 2 weeks.

## 2023-04-05 NOTE — Assessment & Plan Note (Signed)
BP was low in hospital but has returned to normal and is 140-150 at home. Needs to resume losartan 25 mg daily. Follow up CMP in 2 weeks after starting back.

## 2023-04-05 NOTE — Assessment & Plan Note (Signed)
Some chronic SOB on exertion. Recent echo in hospital and they were told they should have stress test outpatient. Referral to cardiology.

## 2023-04-05 NOTE — Assessment & Plan Note (Signed)
Continue crestor 40mg daily. 

## 2023-04-19 ENCOUNTER — Other Ambulatory Visit: Payer: Self-pay | Admitting: Internal Medicine

## 2023-04-22 ENCOUNTER — Other Ambulatory Visit (INDEPENDENT_AMBULATORY_CARE_PROVIDER_SITE_OTHER): Payer: Medicare Other

## 2023-04-22 DIAGNOSIS — E1169 Type 2 diabetes mellitus with other specified complication: Secondary | ICD-10-CM | POA: Diagnosis not present

## 2023-04-22 LAB — COMPREHENSIVE METABOLIC PANEL
ALT: 35 U/L (ref 0–53)
AST: 31 U/L (ref 0–37)
Albumin: 4.2 g/dL (ref 3.5–5.2)
Alkaline Phosphatase: 46 U/L (ref 39–117)
BUN: 14 mg/dL (ref 6–23)
CO2: 28 mEq/L (ref 19–32)
Calcium: 9.4 mg/dL (ref 8.4–10.5)
Chloride: 104 mEq/L (ref 96–112)
Creatinine, Ser: 0.82 mg/dL (ref 0.40–1.50)
GFR: 93.04 mL/min (ref >60.00)
Glucose, Bld: 179 mg/dL — ABNORMAL HIGH (ref 70–99)
Potassium: 4.6 mEq/L (ref 3.5–5.1)
Sodium: 139 mEq/L (ref 135–145)
Total Bilirubin: 0.6 mg/dL (ref 0.2–1.2)
Total Protein: 6.9 g/dL (ref 6.0–8.3)

## 2023-04-27 ENCOUNTER — Ambulatory Visit (INDEPENDENT_AMBULATORY_CARE_PROVIDER_SITE_OTHER): Payer: Medicare Other | Admitting: Podiatry

## 2023-04-27 DIAGNOSIS — E1149 Type 2 diabetes mellitus with other diabetic neurological complication: Secondary | ICD-10-CM

## 2023-04-27 DIAGNOSIS — E119 Type 2 diabetes mellitus without complications: Secondary | ICD-10-CM | POA: Diagnosis not present

## 2023-04-27 MED ORDER — KETOCONAZOLE 2 % EX CREA: 1.0000 | TOPICAL_CREAM | Freq: Every day | CUTANEOUS | 0 refills | Status: DC

## 2023-04-27 NOTE — Progress Notes (Addendum)
Subjective: Chief Complaint  Patient presents with   Peripheral Neuropathy    Rm 12 Follow up for routine diabetic foot exam. Pt states there has been an improvement in his tingling and numbness. Pt states he has improved so much that he does not have to take Gabapentin.    Nail Problem    RFC     62 year old male presents the office today for diabetic foot exam.  She states that last year he was having some tingling with back on gabapentin along his out of this medication no other experiencing the symptoms.  He is concerned that he trims his toenails with his foot.  No swelling redness or any drainage from the toenail site.  No open lesions.  He has no other concerns today.  Last A1c 7.9 on 03/24/2023 Myrlene Broker, MD  Objective: AAO x3, NAD DP/PT pulses palpable bilaterally, CRT less than 3 seconds Sensation intact with Semmes Weinstein monofilament as well as vibratory sensation. There is no open lesions today.  There is no areas of tenderness.  Flexor, extensor tendons are intact.  MMT 5/5. Nails have mild splitting vertically on the distal portion mostly on the right second, third toes.  There is no edema, erythema or signs of infection of the toenail sites.  There are no open lesions. Minimal interdigital tinea pedis on right, no signs of infection No pain with calf compression, swelling, warmth, erythema  Assessment: Diabetic foot exam, mild tinea pedis  Plan: -All treatment options discussed with the patient including all alternatives, risks, complications.  -He recently trim the nails himself so did not debride them today.  We discussed the nail strengthening help with the swelling and regular debridements of the nail. -He is currently off of gabapentin and the tingling has resolved.  If this were to recur we can restart gabapentin. -Discussed daily foot inspection.  Glucose control. -Ketoconazole.   Return in about 1 year (around 04/26/2024) for diabetic foot  exam.  Vivi Barrack DPM

## 2023-04-27 NOTE — Addendum Note (Signed)
Addended by: Ovid Curd R on: 04/27/2023 10:52 AM   Modules accepted: Orders

## 2023-04-27 NOTE — Patient Instructions (Signed)

## 2023-05-06 DIAGNOSIS — I6521 Occlusion and stenosis of right carotid artery: Secondary | ICD-10-CM | POA: Diagnosis not present

## 2023-05-06 DIAGNOSIS — I6523 Occlusion and stenosis of bilateral carotid arteries: Secondary | ICD-10-CM | POA: Diagnosis not present

## 2023-05-18 ENCOUNTER — Other Ambulatory Visit: Payer: Self-pay | Admitting: Internal Medicine

## 2023-05-21 ENCOUNTER — Ambulatory Visit: Payer: Medicare Other | Admitting: Cardiology

## 2023-06-14 ENCOUNTER — Other Ambulatory Visit: Payer: Self-pay | Admitting: Internal Medicine

## 2023-07-14 ENCOUNTER — Other Ambulatory Visit: Payer: Self-pay | Admitting: Internal Medicine

## 2023-07-19 ENCOUNTER — Encounter: Payer: Self-pay | Admitting: Internal Medicine

## 2023-07-20 ENCOUNTER — Ambulatory Visit: Payer: Medicare Other | Admitting: Cardiology

## 2023-08-03 ENCOUNTER — Encounter: Payer: Self-pay | Admitting: Internal Medicine

## 2023-08-12 ENCOUNTER — Encounter: Payer: Self-pay | Admitting: Internal Medicine

## 2023-08-12 ENCOUNTER — Ambulatory Visit (INDEPENDENT_AMBULATORY_CARE_PROVIDER_SITE_OTHER): Payer: Medicare Other | Admitting: Internal Medicine

## 2023-08-12 VITALS — BP 140/100 | HR 78 | Temp 98.1°F | Ht 67.0 in | Wt 188.0 lb

## 2023-08-12 DIAGNOSIS — Z0000 Encounter for general adult medical examination without abnormal findings: Secondary | ICD-10-CM | POA: Diagnosis not present

## 2023-08-12 DIAGNOSIS — Z8673 Personal history of transient ischemic attack (TIA), and cerebral infarction without residual deficits: Secondary | ICD-10-CM | POA: Diagnosis not present

## 2023-08-12 DIAGNOSIS — E1169 Type 2 diabetes mellitus with other specified complication: Secondary | ICD-10-CM | POA: Diagnosis not present

## 2023-08-12 DIAGNOSIS — F331 Major depressive disorder, recurrent, moderate: Secondary | ICD-10-CM

## 2023-08-12 DIAGNOSIS — I7774 Dissection of vertebral artery: Secondary | ICD-10-CM

## 2023-08-12 DIAGNOSIS — Z7984 Long term (current) use of oral hypoglycemic drugs: Secondary | ICD-10-CM | POA: Diagnosis not present

## 2023-08-12 DIAGNOSIS — I10 Essential (primary) hypertension: Secondary | ICD-10-CM | POA: Diagnosis not present

## 2023-08-12 DIAGNOSIS — E785 Hyperlipidemia, unspecified: Secondary | ICD-10-CM

## 2023-08-12 LAB — LIPID PANEL
Cholesterol: 118 mg/dL (ref 0–200)
HDL: 44.8 mg/dL (ref >39.00)
LDL Cholesterol: 48 mg/dL (ref 0–99)
NonHDL: 73.31
Total CHOL/HDL Ratio: 3
Triglycerides: 127 mg/dL (ref 0.0–149.0)
VLDL: 25.4 mg/dL (ref 0.0–40.0)

## 2023-08-12 LAB — MICROALBUMIN / CREATININE URINE RATIO
Creatinine,U: 94.4 mg/dL
Microalb Creat Ratio: 9.4 mg/g (ref 0.0–30.0)
Microalb, Ur: 8.9 mg/dL — ABNORMAL HIGH (ref 0.0–1.9)

## 2023-08-12 LAB — COMPREHENSIVE METABOLIC PANEL
ALT: 32 U/L (ref 0–53)
AST: 26 U/L (ref 0–37)
Albumin: 4.7 g/dL (ref 3.5–5.2)
Alkaline Phosphatase: 61 U/L (ref 39–117)
BUN: 16 mg/dL (ref 6–23)
CO2: 30 mEq/L (ref 19–32)
Calcium: 9.6 mg/dL (ref 8.4–10.5)
Chloride: 101 mEq/L (ref 96–112)
Creatinine, Ser: 0.82 mg/dL (ref 0.40–1.50)
GFR: 92.84 mL/min (ref >60.00)
Glucose, Bld: 303 mg/dL — ABNORMAL HIGH (ref 70–99)
Potassium: 4.6 mEq/L (ref 3.5–5.1)
Sodium: 139 mEq/L (ref 135–145)
Total Bilirubin: 0.7 mg/dL (ref 0.2–1.2)
Total Protein: 7.6 g/dL (ref 6.0–8.3)

## 2023-08-12 LAB — CBC
HCT: 47.6 % (ref 39.0–52.0)
Hemoglobin: 15.9 g/dL (ref 13.0–17.0)
MCHC: 33.4 g/dL (ref 30.0–36.0)
MCV: 89 fl (ref 78.0–100.0)
Platelets: 171 10*3/uL (ref 150.0–400.0)
RBC: 5.35 Mil/uL (ref 4.22–5.81)
RDW: 13.1 % (ref 11.5–15.5)
WBC: 5.6 10*3/uL (ref 4.0–10.5)

## 2023-08-12 LAB — HEMOGLOBIN A1C: Hgb A1c MFr Bld: 8.8 % — ABNORMAL HIGH (ref 4.6–6.5)

## 2023-08-12 MED ORDER — ONETOUCH VERIO VI STRP: 1.0000 | ORAL_STRIP | Freq: Two times a day (BID) | 11 refills | Status: DC

## 2023-08-12 MED ORDER — METFORMIN HCL 1000 MG PO TABS: 1000.0000 mg | ORAL_TABLET | Freq: Two times a day (BID) | ORAL | 3 refills | Status: DC

## 2023-08-12 MED ORDER — LOSARTAN POTASSIUM 25 MG PO TABS: 25.0000 mg | ORAL_TABLET | Freq: Every day | ORAL | 3 refills | Status: DC

## 2023-08-12 MED ORDER — ASPIRIN 81 MG PO TBEC: 81.0000 mg | DELAYED_RELEASE_TABLET | Freq: Every day | ORAL | 3 refills | Status: DC

## 2023-08-12 MED ORDER — ROSUVASTATIN CALCIUM 40 MG PO TABS: 40.0000 mg | ORAL_TABLET | Freq: Every day | ORAL | 3 refills | Status: DC

## 2023-08-12 NOTE — Assessment & Plan Note (Signed)
Stable, BP goal <130/80 and LDL<70. Taking aspirin 81 mg daily and crestor 40 mg daily and adjust as needed.

## 2023-08-12 NOTE — Assessment & Plan Note (Signed)
Checking lipid panel and adjust crestor 40 mg daily as needed.  

## 2023-08-12 NOTE — Progress Notes (Signed)
Subjective:   Patient ID: Curtis Lam, male    DOB: Aug 04, 1961, 62 y.o.   MRN: [id number]  HPI Here for medicare wellness and physical, no new complaints. Please see A/P for status and treatment of chronic medical problems.   Diet: DM since diabetic Physical activity: sedentary, gardening Depression/mood screen: negative Hearing: has hearing aids cannot use, moderate loss bilateral Visual acuity: grossly normal, up to date for annual eye exam  ADLs: capable Fall risk: none Home safety: good Cognitive evaluation: intact to orientation, naming, recall and repetition EOL planning: adv directives discussed, getting done next week  Flowsheet Row Office Visit from 08/12/2023 in [facility]  PHQ-2 Total Score 0       Flowsheet Row Office Visit from 08/12/2023 in [facility] [place]  PHQ-9 Total Score 0         07/07/2022    9:08 AM 10/19/2022   11:06 AM 02/02/2023    8:32 AM 04/05/2023    9:03 AM 08/12/2023    8:13 AM  Fall Risk  Falls in the past year? 0 0 0 0 0  Was there an injury with Fall? 0 0 0 0 0  Fall Risk Category Calculator 0 0 0 0 0  Fall Risk Category (Retired) Low Low     (RETIRED) Patient Fall Risk Level  Low fall risk     Fall risk Follow up  Falls evaluation completed  Falls evaluation completed Falls evaluation completed    I have personally reviewed and have noted 1. The patient's medical and social history - reviewed today no changes 2. Their use of alcohol, tobacco or illicit drugs 3. Their current medications and supplements 4. The patient's functional ability including ADL's, fall risks, home safety risks and hearing or visual impairment. 5. Diet and physical activities 6. Evidence for depression or mood disorders 7. Care team reviewed and updated 8.  The patient is not on an opioid pain medication.  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   HENT: Negative.    Eyes: Negative.   Respiratory:  Negative for cough, chest tightness and shortness of breath.   Cardiovascular:  Negative for chest pain, palpitations and leg swelling.  Gastrointestinal:  Negative for abdominal distention, abdominal pain, constipation, diarrhea, nausea and vomiting.  Musculoskeletal: Negative.   Skin: Negative.   Neurological: Negative.   Psychiatric/Behavioral: Negative.      Objective:  Physical Exam Constitutional:      Appearance: He is well-developed.  HENT:     Head: Normocephalic and atraumatic.  Cardiovascular:  Rate and Rhythm: Normal rate and regular rhythm.  Pulmonary:     Effort: Pulmonary effort is normal. No respiratory distress.     Breath sounds: Normal breath sounds. No wheezing or rales.  Abdominal:     General: Bowel sounds are normal. There is no distension.     Palpations: Abdomen is soft.     Tenderness: There is no abdominal tenderness. There is no rebound.  Musculoskeletal:     Cervical back: Normal range of motion.  Skin:    General: Skin is warm and dry.  Neurological:     Mental Status: He is alert and oriented to person, place, and time.     Coordination: Coordination normal.     Vitals:   08/12/23 0811  08/12/23 0813  BP: (!) 140/100 (!) 140/100  Pulse: 78   Temp: 98.1 F (36.7 C)   TempSrc: Oral   SpO2: 97%   Weight: 188 lb (85.3 kg)   Height: 5\' 7"  (1.702 m)     Assessment & Plan:

## 2023-08-12 NOTE — Assessment & Plan Note (Signed)
Controlled off medications currently. Seeing mental health still as needed.

## 2023-08-12 NOTE — Assessment & Plan Note (Signed)
Checking HgA1c and lipid panel. BP above goal and will monitor at home. Taking aspirin 81 mg daily and crestor 40 mg daily. Adjust as needed.

## 2023-08-12 NOTE — Assessment & Plan Note (Signed)
Flu shot yearly. Shingrix due at pharmacy. Tetanus due at pharmacy. Colonoscopy getting done soon. Counseled about sun safety and mole surveillance. Counseled about the dangers of distracted driving. Given 10 year screening recommendations.

## 2023-08-12 NOTE — Assessment & Plan Note (Signed)
Checking HgA1c, is only taking metformin 1000 mg daily currently due to diarrhea with BID dosing and ozempic cost due to doughnut hole. Checking CMP and lipid panel and microalbumin to creatinine ratio and adjust as needed. On ARB and statin.

## 2023-08-12 NOTE — Assessment & Plan Note (Signed)
BP is high today and he states controlled at home. He will monitor and let us know levels. BP goal <130/80. Taking losartan 25 mg daily. Checking CMP and adjust as needed.

## 2023-08-13 ENCOUNTER — Encounter: Payer: Self-pay | Admitting: Internal Medicine

## 2023-09-16 ENCOUNTER — Ambulatory Visit: Payer: Medicare Other | Admitting: Cardiology

## 2023-10-13 NOTE — Progress Notes (Unsigned)
Cardiology Office Note   Date:  10/14/2023   ID:  Curtis Lam, DOB [birth year], MRN [id number]  PCP:  Myrlene Broker, MD  Cardiologist:   None Referring:  Myrlene Broker, MD  Chief Complaint  Patient presents with   Dizziness      History of Present Illness: Curtis Lam is a 63 y.o. male who presents for evaluation with known vascular disease.  He has a history of CEA.  He said he had a stroke in 2014 but he does not have any of the details of this.  He said that more recently he had some visual disturbances.  He was told that he did not have stroke but he was found to have carotid disease and had surgery as above.  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  He had carotid endarterectomy on 03/30/2023 at [facility].  He still gets dizziness and this sometimes keeps him from being particularly active in the yard.  He gets some orthostatic symptoms going from a sitting to a standing or lying to a standing position.  Has not had frank syncope.  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  He is also limited by diarrhea.  He has with his limited activities no chest pressure, neck or arm discomfort.  He is not describing palpitations, presyncope or syncope.  He has had no PND or orthopnea.  He does get some DOE with this activity.    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     aspirin EC 81 MG tablet Take  1 tablet (81 mg total) by mouth daily. Swallow whole. 90 tablet 3   Blood Glucose Monitoring Suppl (ONETOUCH VERIO) w/Device KIT Use to check blood sugars twice a day 1 kit 0   glucose blood (ONETOUCH VERIO) test strip 1 each by Other route 2 (two) times daily. 100 each 11   ketoconazole (NIZORAL) 2 % cream Apply 1 Application topically daily. 60 g 0   losartan (COZAAR) 25 MG tablet Take 1 tablet (25 mg total) by mouth daily. 90 tablet 3   metFORMIN (GLUCOPHAGE) 1000 MG tablet Take 1 tablet (1,000 mg total) by mouth 2 (two) times daily with a meal. 180 tablet 3   OneTouch Delica Lancets 33G MISC Use to check blood sugars twice a day 100 each 0   OZEMPIC, 0.25 OR 0.5 MG/DOSE, 2 MG/3ML SOPN INJECT 0.5 MG INTO THE SKIN EVERY 7 DAYS. 3 mL 0   rosuvastatin (CRESTOR) 40 MG tablet Take 1 tablet (40 mg total) by mouth daily. 90 tablet 3   Semaglutide, 1 MG/DOSE, (OZEMPIC, 1 MG/DOSE,) 2 MG/1.5ML SOPN Inject 1 mg into the skin once a week. (Patient not taking: Reported on 10/14/2023) 3 mL 0   Semaglutide, 2 MG/DOSE, (OZEMPIC, 2 MG/DOSE,) 8  MG/3ML SOPN Inject 2 mg into the skin once a week. 3 mL 6   tamsulosin (FLOMAX) 0.4 MG CAPS capsule Take 0.4 mg by mouth daily.     No current facility-administered medications for this visit.    Allergies:   Patient has no known allergies.    Social History:  The patient  reports that he has never smoked. He has never used smokeless tobacco. He reports current alcohol use of about 1.0 standard drink of alcohol per week. He reports that he does not use drugs.   Family History:  The patient's family history includes Alzheimer's disease in his father; Cancer in his paternal aunt; Depression in his sister; Diabetes in his maternal grandfather; Diabetes (age of onset: 4) in his son; Heart disease in his maternal grandfather.    ROS:  Please see the history of present illness.   Otherwise, review of systems are positive for none.   All other systems are reviewed and  negative.    PHYSICAL EXAM: VS:  BP 137/84 (BP Location: Right Arm, Patient Position: Standing, Cuff Size: Normal)   Pulse 66   SpO2 93%  , BMI There is no height or weight on file to calculate BMI. GENERAL:  Well appearing HEENT:  Pupils equal round and reactive, fundi not visualized, oral mucosa unremarkable NECK:  No jugular venous distention, waveform within normal limits, carotid upstroke brisk and symmetric, no bruits, no thyromegaly LYMPHATICS:  No cervical, inguinal adenopathy LUNGS:  Clear to auscultation bilaterally BACK:  No CVA tenderness CHEST:  Unremarkable HEART:  PMI not displaced or sustained,S1 and S2 within normal limits, no S3, no S4, no clicks, no rubs, no murmurs ABD:  Flat, positive bowel sounds normal in frequency in pitch, no bruits, no rebound, no guarding, no midline pulsatile mass, no hepatomegaly, no splenomegaly EXT:  2 plus pulses throughout, no edema, no cyanosis no clubbing SKIN:  No rashes no nodules NEURO:  Cranial nerves II through XII grossly intact, motor grossly intact throughout PSYCH:  Cognitively intact, oriented to person place and time    EKG:        Recent Labs: 08/12/2023: ALT 32; BUN 16; Creatinine, Ser 0.82; Hemoglobin 15.9; Platelets 171.0; Potassium 4.6; Sodium 139    Lipid Panel    Component Value Date/Time   CHOL 118 08/12/2023 0853   TRIG 127.0 08/12/2023 0853   HDL 44.80 08/12/2023 0853   CHOLHDL 3 08/12/2023 0853   VLDL 25.4 08/12/2023 0853   LDLCALC 48 08/12/2023 0853   LDLDIRECT 123.0 10/04/2018 1556      Wt Readings from Last 3 Encounters:  08/12/23 188 lb (85.3 kg)  04/05/23 183 lb (83 kg)  02/02/23 191 lb (86.6 kg)      Other studies Reviewed: Additional studies/ records that were reviewed today include: Care Everywhere. Review of the above records demonstrates:  Please see elsewhere in the note.     ASSESSMENT AND PLAN:  Carotid stenosis:  He has right ICA and less than 50% stenosis in bilateral ICA.    He will continue with aggressive risk reduction.  DM: A1c is 8.8 but he is in the donut hole right now and is off of his Ozempic.  He is getting this restarted by Myrlene Broker, MD  HTN: He is at target for his blood pressure.  No change in therapy.  Dyslipidemia: His LDL is 48 with an HDL of 44.8.  He will continue the meds as listed.  Shortness of breath: He does get  some dyspnea with exertion and has vascular disease.  I like to screen him with a POET (Plain Old Exercise Treadmill)  Dizziness: Today in the office he was not orthostatic.  I will order a two week Zio.  If this is negative then no further cardiac work up.     Current medicines are reviewed at length with the patient today.  The patient does not have concerns regarding medicines.  The following changes have been made:  no change  Labs/ tests ordered today include:   Orders Placed This Encounter  Procedures   LONG TERM MONITOR (3-14 DAYS)   Exercise Tolerance Test   EKG 12-Lead     Disposition:   FU with me as needed based on the results of the above.     Signed, Rollene Rotunda, MD  10/14/2023 9:53 AM    [facility]

## 2023-10-14 ENCOUNTER — Ambulatory Visit (INDEPENDENT_AMBULATORY_CARE_PROVIDER_SITE_OTHER): Payer: Medicare Other

## 2023-10-14 ENCOUNTER — Ambulatory Visit: Payer: Medicare Other | Attending: Cardiology | Admitting: Cardiology

## 2023-10-14 ENCOUNTER — Encounter: Payer: Self-pay | Admitting: Cardiology

## 2023-10-14 VITALS — BP 137/84 | HR 66

## 2023-10-14 DIAGNOSIS — R0609 Other forms of dyspnea: Secondary | ICD-10-CM

## 2023-10-14 DIAGNOSIS — I10 Essential (primary) hypertension: Secondary | ICD-10-CM

## 2023-10-14 NOTE — Progress Notes (Unsigned)
Enrolled patient for a 14 day Zio XT  monitor to be mailed to patients home  °

## 2023-10-14 NOTE — Patient Instructions (Signed)
Testing/Procedures: Your physician has requested that you have an exercise tolerance test. For further information please visit [email or web address] Please also follow instruction sheet, as given. [address]   ZIO XT- Long Term Monitor Instructions   Your physician has requested you wear your ZIO patch monitor___14____days.   This is a single patch monitor.  [employer] supplies one patch monitor per enrollment.  Additional stickers are not available.   Please do not apply patch if you will be having a Nuclear Stress Test, Echocardiogram, Cardiac CT, MRI, or Chest Xray during the time frame you would be wearing the monitor. The patch cannot be worn during these tests.  You cannot remove and re-apply the ZIO XT patch monitor.   Your ZIO patch monitor will be sent USPS Priority mail from [facility] directly to your home address. The monitor may also be mailed to a PO BOX if home delivery is not available.   It may take 3-5 days to receive your monitor after you have been enrolled.   Once you have received you monitor, please review enclosed instructions.  Your monitor has already been registered assigning a specific monitor serial # to you.   Applying the monitor   Shave hair from upper left chest.   Hold abrader disc by orange tab.  Rub abrader in 40 strokes over left upper chest as indicated in your monitor instructions.   Clean area with 4 enclosed alcohol pads .  Use all pads to assure are is cleaned thoroughly.  Let dry.   Apply patch as indicated in monitor instructions.  Patch will be place under collarbone on left side of chest with arrow pointing upward.   Rub patch adhesive wings for 2 minutes.Remove white label marked "1".  Remove white label marked "2".  Rub patch adhesive wings for 2 additional minutes.   While looking in a mirror, press and release button in center of patch.  A small green light will flash 3-4 times .  This will be your only indicator the  monitor has been turned on.     Do not shower for the first 24 hours.  You may shower after the first 24 hours.   Press button if you feel a symptom. You will hear a small click.  Record Date, Time and Symptom in the Patient Log Book.   When you are ready to remove patch, follow instructions on last 2 pages of Patient Log Book.  Stick patch monitor onto last page of Patient Log Book.   Place Patient Log Book in Elliott box.  Use locking tab on box and tape box closed securely.  The [employer] and [employer] has [employer] on it.  Please place in mailbox as soon as possible.  Your physician should have your test results approximately 7 days after the monitor has been mailed back to [facility].   Call [facility] Customer Care at [phone] if you have questions regarding your ZIO XT patch monitor.  Call them immediately if you see an orange light blinking on your monitor.   If your monitor falls off in less than 4 days contact our Monitor department at [phone].  If your monitor becomes loose or falls off after 4 days call [employer] at [phone] for suggestions on securing your monitor.     Follow-Up: At [facility], you and your health needs are our priority.  As part of our continuing mission to provide you with exceptional heart care, we have created designated Provider  Care Teams.  These Care Teams include your primary Cardiologist (physician) and Advanced Practice Providers (APPs -  Physician Assistants and Nurse Practitioners) who all work together to provide you with the care you need, when you need it.  We recommend signing up for the patient portal called "MyChart".  Sign up information is provided on this After Visit Summary.  MyChart is used to connect with patients for Virtual Visits (Telemedicine).  Patients are able to view lab/test results, encounter notes, upcoming appointments, etc.  Non-urgent messages can be sent to your provider as well.   To learn more  about what you can do with MyChart, go to [email or web address].    Follow up as needed with Dr Antoine Poche.

## 2023-10-19 ENCOUNTER — Encounter: Payer: Self-pay | Admitting: Cardiology

## 2023-10-19 NOTE — Telephone Encounter (Signed)
FYI

## 2023-10-21 ENCOUNTER — Ambulatory Visit: Payer: Medicare Other

## 2023-10-23 DIAGNOSIS — R0609 Other forms of dyspnea: Secondary | ICD-10-CM | POA: Diagnosis not present

## 2023-10-23 DIAGNOSIS — I10 Essential (primary) hypertension: Secondary | ICD-10-CM | POA: Diagnosis not present

## 2023-11-09 ENCOUNTER — Ambulatory Visit: Payer: Medicare Other | Admitting: Internal Medicine

## 2023-11-10 ENCOUNTER — Encounter: Payer: Medicare Other | Admitting: Internal Medicine

## 2023-12-02 ENCOUNTER — Encounter: Payer: Self-pay | Admitting: Internal Medicine

## 2023-12-02 ENCOUNTER — Ambulatory Visit (INDEPENDENT_AMBULATORY_CARE_PROVIDER_SITE_OTHER): Payer: Medicare Other | Admitting: Internal Medicine

## 2023-12-02 VITALS — BP 138/86 | HR 69 | Temp 97.8°F | Ht 67.0 in | Wt 191.0 lb

## 2023-12-02 DIAGNOSIS — Z23 Encounter for immunization: Secondary | ICD-10-CM | POA: Diagnosis not present

## 2023-12-02 DIAGNOSIS — E1169 Type 2 diabetes mellitus with other specified complication: Secondary | ICD-10-CM | POA: Diagnosis not present

## 2023-12-02 DIAGNOSIS — Z7984 Long term (current) use of oral hypoglycemic drugs: Secondary | ICD-10-CM | POA: Diagnosis not present

## 2023-12-02 LAB — POCT GLYCOSYLATED HEMOGLOBIN (HGB A1C): HbA1c POC (<> result, manual entry): 8.6 % (ref 4.0–5.6)

## 2023-12-02 MED ORDER — GLIMEPIRIDE 2 MG PO TABS: 2.0000 mg | ORAL_TABLET | Freq: Every day | ORAL | 1 refills | Status: DC

## 2023-12-02 NOTE — Assessment & Plan Note (Signed)
POC HgA1c done and still uncontrolled. Not much difference with metformin 2000 mg daily from 1000 mg daily. Given worsening side effects will reduce back to 1000 mg daily metformin and add amaryl 2 mg daily. In January once out of doughnut hole we will add back either jardiance or ozempic depending on patient preference. He is wanting to work on weight so ozempic may be best option.

## 2023-12-02 NOTE — Progress Notes (Signed)
   Subjective:   Patient ID: GEOGGREY GRESH, male    DOB: 1961-07-16, 62 y.o.   MRN: [id number]  HPI The patient is a 62 YO man coming in for diabetes follow up. Did increase metformin to 2000 mg daily and is having more bowel issues which are unpredictable.   Review of Systems  Constitutional: Negative.   HENT: Negative.    Eyes: Negative.   Respiratory:  Negative for cough, chest tightness and shortness of breath.   Cardiovascular:  Negative for chest pain, palpitations and leg swelling.  Gastrointestinal:  Positive for diarrhea. Negative for abdominal distention, abdominal pain, constipation, nausea and vomiting.  Musculoskeletal: Negative.   Skin: Negative.   Neurological: Negative.   Psychiatric/Behavioral: Negative.      Objective:  Physical Exam Constitutional:      Appearance: He is well-developed.  HENT:     Head: Normocephalic and atraumatic.  Cardiovascular:     Rate and Rhythm: Normal rate and regular rhythm.  Pulmonary:     Effort: Pulmonary effort is normal. No respiratory distress.     Breath sounds: Normal breath sounds. No wheezing or rales.  Abdominal:     General: Bowel sounds are normal. There is no distension.     Palpations: Abdomen is soft.     Tenderness: There is no abdominal tenderness. There is no rebound.  Musculoskeletal:     Cervical back: Normal range of motion.  Skin:    General: Skin is warm and dry.  Neurological:     Mental Status: He is alert and oriented to person, place, and time.     Coordination: Coordination normal.     Vitals:   12/02/23 0820 12/02/23 0831 12/02/23 0853  BP: (!) 140/100 (!) 140/100 138/86  Pulse: 69    Temp: 97.8 F (36.6 C)    TempSrc: Oral    SpO2: 99%    Weight: 191 lb (86.6 kg)    Height: 5\' 7"  (1.702 m)      Assessment & Plan:

## 2023-12-02 NOTE — Patient Instructions (Signed)
We will start glimepiride to take 1 pill daily with 1st meal of the day. Go down to 1 pill daily of metformin.   We can think about ozempic or jardiance in January to help sugars long term.

## 2023-12-07 ENCOUNTER — Telehealth: Payer: Self-pay | Admitting: *Deleted

## 2023-12-07 ENCOUNTER — Telehealth: Payer: Self-pay

## 2023-12-07 ENCOUNTER — Ambulatory Visit: Payer: Medicare Other | Admitting: *Deleted

## 2023-12-07 VITALS — Ht 67.0 in | Wt 185.0 lb

## 2023-12-07 DIAGNOSIS — Z860100 Personal history of colon polyps, unspecified: Secondary | ICD-10-CM

## 2023-12-07 MED ORDER — NA SULFATE-K SULFATE-MG SULF 17.5-3.13-1.6 GM/177ML PO SOLN: 1.0000 | Freq: Once | ORAL | 0 refills | Status: AC

## 2023-12-07 NOTE — Telephone Encounter (Signed)
Made pt aware that procedure has not been canceled and previsit is good for 3 months

## 2023-12-07 NOTE — Progress Notes (Unsigned)
Pt's name and DOB verified at the beginning of the pre-visit wit 2 identifiers  Pt denies any difficulty with ambulating,sitting, laying down or rolling side to side  Pt has no issues with ambulation   Pt has no issues moving head neck or swallowing  No egg or soy allergy known to patient   No issues known to pt with past sedation with any surgeries or procedures  Pt denies having issues being intubated  No FH of Malignant Hyperthermia  Pt is not on diet pills or shots  Pt is not on home 02   Pt is not on blood thinners   Pt denies issues with constipation   Pt is not on dialysis  Pt denise any abnormal heart rhythms   Pt denies any upcoming cardiac testing  Pt encouraged to use to use Singlecare or Goodrx to reduce cost   Patient's chart reviewed by Cathlyn Parsons CNRA prior to pre-visit and patient appropriate for the [facility].  Pre-visit completed and red dot placed by patient's name on their procedure day (on provider's schedule).  .  Visit by phone  Pt states weight is 185 lb  Instructed pt why it is important to and  to call if they have any changes in health or new medications. Directed them to the # given and on instructions.     Instructions reviewed. Pt given both [facility] main # and MD on call # prior to instructions.  Pt states understanding. Instructed to review again prior to procedure. Pt states they will.   Instructions sent by mail with coupon and by My Chart   Coupon sent via text to mobile phone and pt verified they received it

## 2023-12-07 NOTE — Telephone Encounter (Signed)
Left message that we were not able to complete his pre visit and that he would need to call back by 5pm today to reschedule his PV or his colonoscopy would be cancelled.

## 2023-12-07 NOTE — Telephone Encounter (Signed)
Called at 8:02 & 8:08 to try and reach patient for PV appt.  Left messages both times that I was trying to reach him and that the PV was mandatory for his colonoscopy.

## 2023-12-07 NOTE — Telephone Encounter (Signed)
Informed  that procedure is still scheduled and PV is good for 3 months.

## 2023-12-07 NOTE — Telephone Encounter (Signed)
Inbound call from patient wife stating patient received a message stating patient missed his PV phone call. Stated patient spoke with Chip Boer for pre visit. Patient's wife requesting a call back. Please advise, thank you.

## 2023-12-09 DIAGNOSIS — I6523 Occlusion and stenosis of bilateral carotid arteries: Secondary | ICD-10-CM | POA: Diagnosis not present

## 2024-01-07 ENCOUNTER — Encounter: Payer: Medicare Other | Admitting: Internal Medicine

## 2024-01-13 ENCOUNTER — Telehealth: Payer: Self-pay | Admitting: Internal Medicine

## 2024-01-13 ENCOUNTER — Other Ambulatory Visit: Payer: Self-pay | Admitting: Internal Medicine

## 2024-01-13 NOTE — Telephone Encounter (Signed)
Copied from CRM [id number]. Topic: Clinical - Medication Refill >> Jan 13, 2024 12:21 PM Pascal Lux wrote: Most Recent Primary Care Visit:  Provider: Hillard Danker A  Department[facility]  Visit Type: OFFICE VISIT  Date: 12/02/2023  Medication: Semaglutide, 1 MG/DOSE, (OZEMPIC, 1 MG/DOSE,) 2 MG/1.5ML SOPN [[id number]]  Has the patient contacted their pharmacy? Yes (Agent: If no, request that the patient contact the pharmacy for the refill. If patient does not wish to contact the pharmacy document the reason why and proceed with request.) (Agent: If yes, when and what did the pharmacy advise? - Patient was advised to call and request a prescription be sent to them.  Is this the correct pharmacy for this prescription? Yes If no, delete pharmacy and type the correct one.  This is the patient's preferred pharmacy:  [facility] - [place], [place] - [address] [place] [address] Phone: [phone] Fax: [phone]   Has the prescription been filled recently? No  Is the patient out of the medication? Yes  Has the patient been seen for an appointment in the last year OR does the patient have an upcoming appointment? Yes  Can we respond through MyChart? Yes  Agent: Please be advised that Rx refills may take up to 3 business days. We ask that you follow-up with your pharmacy.

## 2024-01-13 NOTE — Telephone Encounter (Signed)
Copied from CRM [id number]. Topic: Clinical - Medication Question >> Jan 13, 2024  8:49 AM Isabell A wrote: Reason for CRM: Spouse calling on behalf of patient, would like to re-order OZEMPIC, 0.25 OR 0.5 MG/DOSE, 2 MG/3ML SOPN to [facility], states it is a lot cheaper to do mail order.   Spouse would like a call back to discuss, states patients blood sugar is not controlled & would like to discuss this.

## 2024-01-13 NOTE — Telephone Encounter (Signed)
Copied from CRM [id number]. Topic: Clinical - Medication Question >> Jan 13, 2024 12:43 PM Truddie Crumble wrote: Reason for CRM: pt wife called stating the pt was approved for ozempic medication assistance and she would like the medication sent to [facility] in [place] instead of [facility]. Pt wife stated a form is being sent to the provider to sign by email

## 2024-01-13 NOTE — Telephone Encounter (Signed)
Last Fill: 07/14/23  Last OV: 12/02/2023 Next OV: Not Scheduled  Routing to provider for review/authorization

## 2024-01-13 NOTE — Telephone Encounter (Signed)
noted 

## 2024-01-13 NOTE — Telephone Encounter (Signed)
Please send through [facility]

## 2024-01-14 ENCOUNTER — Encounter: Payer: Self-pay | Admitting: Pharmacist

## 2024-01-14 MED ORDER — OZEMPIC (0.25 OR 0.5 MG/DOSE) 2 MG/3ML ~~LOC~~ SOPN: PEN_INJECTOR | SUBCUTANEOUS | 0 refills | Status: AC

## 2024-01-14 NOTE — Telephone Encounter (Signed)
This has been sent in

## 2024-01-18 ENCOUNTER — Other Ambulatory Visit (HOSPITAL_COMMUNITY): Payer: Self-pay

## 2024-01-24 ENCOUNTER — Telehealth: Payer: Self-pay

## 2024-01-24 NOTE — Telephone Encounter (Signed)
Patients assistance has arrived in office and has been placed in the refrigerator.  Curtis Lam,CMA  Patient has been notified that shipment has arrived and can come retrieve.

## 2024-01-24 NOTE — Telephone Encounter (Signed)
 Please advise

## 2024-01-24 NOTE — Telephone Encounter (Signed)
Pt has picked up medication.  

## 2024-02-18 ENCOUNTER — Encounter: Payer: Self-pay | Admitting: Gastroenterology

## 2024-02-22 ENCOUNTER — Ambulatory Visit: Payer: Medicare Other | Admitting: Internal Medicine

## 2024-02-22 ENCOUNTER — Encounter: Payer: Self-pay | Admitting: Internal Medicine

## 2024-02-22 VITALS — BP 143/84 | HR 80 | Temp 97.7°F | Resp 18 | Ht 67.0 in | Wt 185.0 lb

## 2024-02-22 DIAGNOSIS — Z1211 Encounter for screening for malignant neoplasm of colon: Secondary | ICD-10-CM | POA: Diagnosis not present

## 2024-02-22 DIAGNOSIS — D120 Benign neoplasm of cecum: Secondary | ICD-10-CM

## 2024-02-22 DIAGNOSIS — K648 Other hemorrhoids: Secondary | ICD-10-CM | POA: Diagnosis not present

## 2024-02-22 MED ORDER — SODIUM CHLORIDE 0.9 % IV SOLN: 500.0000 mL | Freq: Once | INTRAVENOUS | Status: DC

## 2024-02-22 NOTE — Progress Notes (Signed)
 GASTROENTEROLOGY PROCEDURE H&P NOTE   Primary Care Physician: Myrlene Broker, MD    Reason for Procedure:   Colon cancer screening  Plan:    colonoscopy  Patient is appropriate for endoscopic procedure(s) in the ambulatory ([facility]) setting.  The nature of the procedure, as well as the risks, benefits, and alternatives were carefully and thoroughly reviewed with the patient. Ample time for discussion and questions allowed. The patient understood, was satisfied, and agreed to proceed.     HPI: Curtis Lam is a 63 y.o. male who presents for colonoscopy.  Medical history as below.  Tolerated the prep.  No recent chest pain or shortness of breath.  No abdominal pain today.  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 Authorizing Provider  acetaminophen (TYLENOL) 650 MG CR tablet Take by mouth. Patient not taking: Reported on 12/07/2023 03/31/23   [provider]  aspirin EC 81 MG tablet Take 1 tablet (81 mg total) by mouth daily. Swallow whole. Patient not taking: Reported on 12/07/2023 08/12/23   Myrlene Broker, MD  Blood Glucose Monitoring Suppl [facility] VERIO) w/Device KIT Use to check blood sugars twice a day 12/25/22   Myrlene Broker, MD  CHROMIUM PO Take 500 mg by mouth daily at 6 (six) AM.    [provider]  CINNAMON EXTRACT PO Take by mouth daily at 6 (six) AM. 1000 mg    [provider]  glimepiride (AMARYL) 2 MG  tablet Take 1 tablet (2 mg total) by mouth daily before breakfast. 12/02/23   Myrlene Broker, MD  glucose blood (ONETOUCH VERIO) test strip 1 each by Other route 2 (two) times daily. 08/12/23   Myrlene Broker, MD  ketoconazole (NIZORAL) 2 % cream Apply 1 Application topically daily. Patient not taking: Reported on 12/07/2023 04/27/23   Vivi Barrack, DPM  losartan (COZAAR) 25 MG tablet Take 1 tablet (25 mg total) by mouth daily. 08/12/23   Myrlene Broker, MD  metFORMIN (GLUCOPHAGE) 1000 MG tablet Take 1 tablet (1,000 mg total) by mouth 2 (two) times daily with a meal. Patient taking differently: Take 1,000 mg by mouth daily at 6 (six) AM. 08/12/23   Myrlene Broker, MD  OneTouch Delica Lancets 33G MISC Use to check blood sugars twice a day 12/25/22   Myrlene Broker, MD  rosuvastatin (CRESTOR) 40 MG tablet Take 1 tablet (40 mg total) by mouth daily. 08/12/23   Myrlene Broker, MD  Semaglutide, 1 MG/DOSE, (OZEMPIC, 1 MG/DOSE,) 2 MG/1.5ML SOPN Inject 1 mg into the skin once a week. Patient not taking: Reported on 10/14/2023 04/03/23   Myrlene Broker, MD  Semaglutide, 2 MG/DOSE, (OZEMPIC, 2 MG/DOSE,) 8 MG/3ML SOPN Inject 2 mg into the skin once a week. Patient not taking: Reported on 02/22/2024 05/03/23   Myrlene Broker, MD  Semaglutide,0.25  or 0.5MG /DOS, (OZEMPIC, 0.25 OR 0.5 MG/DOSE,) 2 MG/3ML SOPN Inject 0.25 mg into the skin once a week for 28 days, THEN 0.5 mg once a week for 28 days. 01/14/24 03/10/24  Myrlene Broker, MD  tamsulosin (FLOMAX) 0.4 MG CAPS capsule Take 0.4 mg by mouth daily. 01/27/21   [provider]    Current Outpatient Medications  Medication Sig Dispense Refill   acetaminophen (TYLENOL) 650 MG CR tablet Take by mouth. (Patient not taking: Reported on 12/07/2023)     aspirin EC 81 MG tablet Take 1 tablet (81 mg total) by mouth daily. Swallow whole. (Patient not taking: Reported on 12/07/2023) 90 tablet 3   Blood  Glucose Monitoring Suppl (ONETOUCH VERIO) w/Device KIT Use to check blood sugars twice a day 1 kit 0   CHROMIUM PO Take 500 mg by mouth daily at 6 (six) AM.     CINNAMON EXTRACT PO Take by mouth daily at 6 (six) AM. 1000 mg     glimepiride (AMARYL) 2 MG tablet Take 1 tablet (2 mg total) by mouth daily before breakfast. 90 tablet 1   glucose blood (ONETOUCH VERIO) test strip 1 each by Other route 2 (two) times daily. 100 each 11   ketoconazole (NIZORAL) 2 % cream Apply 1 Application topically daily. (Patient not taking: Reported on 12/07/2023) 60 g 0   losartan (COZAAR) 25 MG tablet Take 1 tablet (25 mg total) by mouth daily. 90 tablet 3   metFORMIN (GLUCOPHAGE) 1000 MG tablet Take 1 tablet (1,000 mg total) by mouth 2 (two) times daily with a meal. (Patient taking differently: Take 1,000 mg by mouth daily at 6 (six) AM.) 180 tablet 3   OneTouch Delica Lancets 33G MISC Use to check blood sugars twice a day 100 each 0   rosuvastatin (CRESTOR) 40 MG tablet Take 1 tablet (40 mg total) by mouth daily. 90 tablet 3   Semaglutide, 1 MG/DOSE, (OZEMPIC, 1 MG/DOSE,) 2 MG/1.5ML SOPN Inject 1 mg into the skin once a week. (Patient not taking: Reported on 10/14/2023) 3 mL 0   Semaglutide, 2 MG/DOSE, (OZEMPIC, 2 MG/DOSE,) 8 MG/3ML SOPN Inject 2 mg into the skin once a week. (Patient not taking: Reported on 02/22/2024) 3 mL 6   Semaglutide,0.25 or 0.5MG /DOS, (OZEMPIC, 0.25 OR 0.5 MG/DOSE,) 2 MG/3ML SOPN Inject 0.25 mg into the skin once a week for 28 days, THEN 0.5 mg once a week for 28 days. 6 mL 0   tamsulosin (FLOMAX) 0.4 MG CAPS capsule Take 0.4 mg by mouth daily.     Current Facility-Administered Medications  Medication Dose Route Frequency Provider Last Rate Last Admin   0.9 %  sodium chloride infusion  500 mL Intravenous Once Donalda Job, Carie Caddy, MD        Allergies as of 02/22/2024   (No Known Allergies)    Family History  Problem Relation Age of Onset   Alzheimer's disease Father    Depression Sister     Cancer Paternal Aunt        uterine, ovarian   Diabetes Maternal Grandfather    Heart disease Maternal Grandfather    Diabetes Son 58   Colon cancer Neg Hx    Colon polyps Neg Hx    Esophageal cancer Neg Hx    Rectal cancer Neg Hx    Stomach cancer Neg Hx     Social History   Socioeconomic History   Marital status: Married    Spouse name: Not on file   Number of  children: 2   Years of education: Not on file   Highest education level: 12th grade  Occupational History   Occupation: Engineer, mining)    Employer: [employer]  Tobacco Use   Smoking status: Never   Smokeless tobacco: Never  Vaping Use   Vaping status: Never Used  Substance and Sexual Activity   Alcohol use: Yes    Alcohol/week: 1.0 standard drink of alcohol    Types: 1 Cans of beer per week    Comment: 1 beer or wine 2-3 times per month, more in summer than winter   Drug use: No   Sexual activity: Yes    Partners: Female  Other Topics Concern   Not on file  Social History Narrative   Lives with wife and son.  Other son lives in [place].  Wife suffers from depression, recent flare and hospitalization (12/2013); He lost his job 09/2014 for 4-5 weeks, and got new job   Social Drivers of Corporate investment banker Strain: Low Risk  (12/01/2023)   Overall Financial Resource Strain (CARDIA)    Difficulty of Paying Living Expenses: Not very hard  Food Insecurity: Food Insecurity Present (12/01/2023)   Hunger Vital Sign    Worried About Running Out of Food in the Last Year: Sometimes true    Ran Out of Food in the Last Year: Sometimes true  Transportation Needs: No Transportation Needs (12/01/2023)   PRAPARE - Administrator, Civil Service (Medical): No    Lack of Transportation (Non-Medical): No  Physical Activity: Unknown (12/01/2023)   Exercise Vital Sign    Days of Exercise per Week: 0 days    Minutes of Exercise per Session: Not on file  Stress: No Stress Concern Present (12/01/2023)    [employer] of Occupational Health - Occupational Stress Questionnaire    Feeling of Stress : Only a little  Social Connections: Socially Isolated (12/01/2023)   Social Connection and Isolation Panel [NHANES]    Frequency of Communication with Friends and Family: Never    Frequency of Social Gatherings with Friends and Family: Never    Attends Religious Services: Never    Database administrator or Organizations: No    Attends Engineer, structural: Not on file    Marital Status: Married  Intimate Partner Violence: Unknown (03/23/2022)   Received from [employer], [facility]   HITS    Physically Hurt: Not on file    Insult or Talk Down To: Not on file    Threaten Physical Harm: Not on file    Scream or Curse: Not on file    Physical Exam: Vital signs in last 24 hours: @BP  (!) 151/90   Pulse 71   Temp 97.7 F (36.5 C)   Resp 12   Ht 5\' 7"  (1.702 m)   Wt 185 lb (83.9 kg)   SpO2 95%   BMI 28.98 kg/m  GEN: NAD EYE: Sclerae anicteric ENT: MMM CV: Non-tachycardic Pulm: CTA b/l GI: Soft, NT/ND NEURO:  Alert & Oriented x 3   Erick Blinks, MD [facility]  02/22/2024 9:15 AM

## 2024-02-22 NOTE — Progress Notes (Signed)
 Report to PACU, RN, vss, BBS= Clear.

## 2024-02-22 NOTE — Patient Instructions (Signed)

## 2024-02-22 NOTE — Op Note (Signed)
 [facility] Patient Name: Curtis Lam Procedure Date: 02/22/2024 9:11 AM MRN: [id number] Endoscopist: Beverley Fiedler , MD, [id number] Age: 63 Referring MD:  Date of Birth: 10/22/1961 Gender: Male Account #: [id number] Procedure:                Colonoscopy Indications:              Screening for colorectal malignant neoplasm, Last                            colonoscopy 10 years ago Medicines:                Monitored Anesthesia Care Procedure:                Pre-Anesthesia Assessment:                           - Prior to the procedure, a History and Physical                            was performed, and patient medications and                            allergies were reviewed. The patient's tolerance of                            previous anesthesia was also reviewed. The risks                            and benefits of the procedure and the sedation                            options and risks were discussed with the patient.                            All questions were answered, and informed consent                            was obtained. Prior Anticoagulants: The patient has                            taken no anticoagulant or antiplatelet agents. ASA                            Grade Assessment: III - A patient with severe                            systemic disease. After reviewing the risks and                            benefits, the patient was deemed in satisfactory                            condition to undergo the procedure.  After obtaining informed consent, the colonoscope                            was passed under direct vision. Throughout the                            procedure, the patient's blood pressure, pulse, and                            oxygen saturations were monitored continuously. The                            Olympus Scope [id number] was introduced through the                            anus and advanced to the cecum,  identified by                            appendiceal orifice and ileocecal valve. The                            colonoscopy was performed without difficulty. The                            patient tolerated the procedure well. The quality                            of the bowel preparation was good. The ileocecal                            valve, appendiceal orifice, and rectum were                            photographed. Scope In: 9:28:57 AM Scope Out: 9:48:49 AM Scope Withdrawal Time: 0 hours 13 minutes 48 seconds  Total Procedure Duration: 0 hours 19 minutes 52 seconds  Findings:                 The digital rectal exam was normal.                           Two sessile polyps were found in the cecum. The                            polyps were 3 mm and 8 mm in size. These polyps                            were removed with a cold snare. Resection and                            retrieval were complete.                           Internal hemorrhoids were found during  retroflexion. The hemorrhoids were small. Complications:            No immediate complications. Estimated Blood Loss:     Estimated blood loss was minimal. Impression:               - Two 3 and 8 mm polyps in the cecum, removed with                            a cold snare. Resected and retrieved.                           - Small internal hemorrhoids. Recommendation:           - Patient has a contact number available for                            emergencies. The signs and symptoms of potential                            delayed complications were discussed with the                            patient. Return to normal activities tomorrow.                            Written discharge instructions were provided to the                            patient.                           - Resume previous diet.                           - Continue present medications.                           - Await  pathology results.                           - Repeat colonoscopy is recommended. The                            colonoscopy date will be determined after pathology                            results from today's exam become available for                            review. Beverley Fiedler, MD 02/22/2024 9:52:37 AM This report has been signed electronically.

## 2024-02-22 NOTE — Progress Notes (Signed)
 Pt's states no medical or surgical changes since previsit or office visit.

## 2024-02-23 ENCOUNTER — Telehealth: Payer: Self-pay | Admitting: *Deleted

## 2024-02-23 NOTE — Telephone Encounter (Signed)
 Left message on f/u call

## 2024-02-24 ENCOUNTER — Telehealth: Payer: Self-pay | Admitting: Internal Medicine

## 2024-02-24 ENCOUNTER — Encounter: Payer: Self-pay | Admitting: Internal Medicine

## 2024-02-24 LAB — SURGICAL PATHOLOGY

## 2024-02-24 NOTE — Telephone Encounter (Signed)
 Copied from CRM [id number]. Topic: Appointments - Transfer of Care >> Feb 24, 2024  3:10 PM Sim Boast F wrote: Pt is requesting to transfer FROM: Dr. Okey Dupre Pt is requesting to transfer TO: Dr. Ermalene Searing - Patient spouse seen Dr. Ermalene Searing when she first went practice back in 2005/2006 and would love to be taken on as a patient  Reason for requested transfer: Provider not addressing addressing issues/ Things falling in between the cracks   It is the responsibility of the team the patient would like to transfer to (Dr. Dr. Ermalene Searing) to reach out to the patient if for any reason this transfer is not acceptable.

## 2024-02-25 NOTE — Telephone Encounter (Signed)
 [email or web address]

## 2024-03-09 ENCOUNTER — Encounter: Payer: Self-pay | Admitting: Internal Medicine

## 2024-04-04 ENCOUNTER — Telehealth: Payer: Self-pay | Admitting: Internal Medicine

## 2024-04-04 NOTE — Telephone Encounter (Signed)
 Copied from CRM [id number]. Topic: General - Other >> Apr 04, 2024 11:46 AM Luane Rumps D wrote: Reason for CRM: Mrs. Genest calling because she faxed over an older version of the form needs to be signed but also just sent a new revision of form. Patient's wife wanted to confirm that both of them had been received & would like communication either way if Dr. Nicolette Barrio will or won't sign.

## 2024-04-04 NOTE — Telephone Encounter (Signed)
 Copied from CRM [id number]. Topic: General - Other >> Apr 04, 2024 11:00 AM Howard Macho wrote: Reason for CRM: patient wife called wanting to see if the doctor can sign a letter stating th patient is disabled so that they can relief from their property tax. Patient wife stated she will fax the form and information on what they are trying to do

## 2024-04-11 NOTE — Telephone Encounter (Signed)
 Copied from CRM [id number]. Topic: Clinical - Request for Lab/Test Order >> Apr 11, 2024  2:37 PM Curtis Lam wrote: Reason for CRM: Patient spouse is requesting blood work since patient is on Ozempic . Requesting blood work to be done after June 1st.

## 2024-04-24 ENCOUNTER — Encounter: Payer: Self-pay | Admitting: Internal Medicine

## 2024-04-24 DIAGNOSIS — E1169 Type 2 diabetes mellitus with other specified complication: Secondary | ICD-10-CM

## 2024-04-25 ENCOUNTER — Telehealth: Payer: Self-pay

## 2024-04-25 NOTE — Telephone Encounter (Signed)
 Called patient and informed him that his ozempic  is here in the office and will be placed in the fridge for pick up.

## 2024-04-26 ENCOUNTER — Ambulatory Visit: Payer: Medicare Other | Admitting: Podiatry

## 2024-04-28 ENCOUNTER — Ambulatory Visit: Admitting: Family Medicine

## 2024-05-05 ENCOUNTER — Other Ambulatory Visit: Payer: Self-pay

## 2024-05-05 ENCOUNTER — Encounter (HOSPITAL_COMMUNITY): Payer: Self-pay | Admitting: *Deleted

## 2024-05-05 ENCOUNTER — Emergency Department (HOSPITAL_COMMUNITY)
Admission: EM | Admit: 2024-05-05 | Discharge: 2024-05-05 | Disposition: A | Discharge status: Home / Self Care | Attending: Emergency Medicine | Admitting: Emergency Medicine

## 2024-05-05 DIAGNOSIS — Z794 Long term (current) use of insulin: Secondary | ICD-10-CM | POA: Insufficient documentation

## 2024-05-05 DIAGNOSIS — Y92007 Garden or yard of unspecified non-institutional (private) residence as the place of occurrence of the external cause: Secondary | ICD-10-CM | POA: Diagnosis not present

## 2024-05-05 DIAGNOSIS — N400 Enlarged prostate without lower urinary tract symptoms: Secondary | ICD-10-CM | POA: Diagnosis not present

## 2024-05-05 DIAGNOSIS — Z79899 Other long term (current) drug therapy: Secondary | ICD-10-CM | POA: Diagnosis not present

## 2024-05-05 DIAGNOSIS — Z7982 Long term (current) use of aspirin: Secondary | ICD-10-CM | POA: Insufficient documentation

## 2024-05-05 DIAGNOSIS — W5911XA Bitten by nonvenomous snake, initial encounter: Secondary | ICD-10-CM | POA: Diagnosis not present

## 2024-05-05 DIAGNOSIS — S91032A Puncture wound without foreign body, left ankle, initial encounter: Secondary | ICD-10-CM | POA: Diagnosis not present

## 2024-05-05 DIAGNOSIS — R791 Abnormal coagulation profile: Secondary | ICD-10-CM | POA: Insufficient documentation

## 2024-05-05 DIAGNOSIS — I10 Essential (primary) hypertension: Secondary | ICD-10-CM | POA: Insufficient documentation

## 2024-05-05 DIAGNOSIS — T63001A Toxic effect of unspecified snake venom, accidental (unintentional), initial encounter: Secondary | ICD-10-CM | POA: Diagnosis not present

## 2024-05-05 DIAGNOSIS — S99912A Unspecified injury of left ankle, initial encounter: Secondary | ICD-10-CM | POA: Diagnosis not present

## 2024-05-05 LAB — CBC WITH DIFFERENTIAL/PLATELET
Abs Immature Granulocytes: 0.02 10*3/uL (ref 0.00–0.07)
Basophils Absolute: 0.1 10*3/uL (ref 0.0–0.1)
Basophils Relative: 1 %
Eosinophils Absolute: 0.2 10*3/uL (ref 0.0–0.5)
Eosinophils Relative: 3 %
HCT: 46.1 % (ref 39.0–52.0)
Hemoglobin: 15.4 g/dL (ref 13.0–17.0)
Immature Granulocytes: 0 %
Lymphocytes Relative: 34 %
Lymphs Abs: 2.2 10*3/uL (ref 0.7–4.0)
MCH: 30.6 pg (ref 26.0–34.0)
MCHC: 33.4 g/dL (ref 30.0–36.0)
MCV: 91.7 fL (ref 80.0–100.0)
Monocytes Absolute: 0.6 10*3/uL (ref 0.1–1.0)
Monocytes Relative: 9 %
Neutro Abs: 3.5 10*3/uL (ref 1.7–7.7)
Neutrophils Relative %: 53 %
Platelets: 170 10*3/uL (ref 150–400)
RBC: 5.03 MIL/uL (ref 4.22–5.81)
RDW: 12.5 % (ref 11.5–15.5)
WBC: 6.6 10*3/uL (ref 4.0–10.5)
nRBC: 0 % (ref 0.0–0.2)

## 2024-05-05 LAB — BASIC METABOLIC PANEL WITH GFR
Anion gap: 8 (ref 5–15)
BUN: 16 mg/dL (ref 8–23)
CO2: 26 mmol/L (ref 22–32)
Calcium: 8.9 mg/dL (ref 8.9–10.3)
Chloride: 105 mmol/L (ref 98–111)
Creatinine, Ser: 0.84 mg/dL (ref 0.61–1.24)
GFR, Estimated: >60 mL/min (ref >60)
Glucose, Bld: 193 mg/dL — ABNORMAL HIGH (ref 70–99)
Potassium: 4.7 mmol/L (ref 3.5–5.1)
Sodium: 139 mmol/L (ref 135–145)

## 2024-05-05 LAB — PROTIME-INR
INR: 1.1 (ref 0.8–1.2)
Prothrombin Time: 14.6 s (ref 11.4–15.2)

## 2024-05-05 LAB — FIBRINOGEN: Fibrinogen: 275 mg/dL (ref 210–475)

## 2024-05-05 MED ORDER — AMOXICILLIN-POT CLAVULANATE 875-125 MG PO TABS: 1.0000 | ORAL_TABLET | Freq: Two times a day (BID) | ORAL | 0 refills | Status: DC

## 2024-05-05 NOTE — ED Provider Notes (Signed)
 [place] EMERGENCY DEPARTMENT AT [facility] Provider Note   CSN: [id number] Arrival date & time: 05/05/24  2000     History  Chief Complaint  Patient presents with   Animal Bite    BENSON Lam is a 63 y.o. male with past medical history significant for hypertension, hyperlipidemia, GERD who presents with concern for snakebite from [place].  Occurred 30 minutes prior to arrival, little to no swelling and minimal pain at present.   Animal Bite      Home Medications Prior to Admission medications   Medication Sig Start Date End Date Taking? Authorizing Provider  acetaminophen  (TYLENOL ) 650 MG CR tablet Take by mouth. Patient not taking: Reported on 12/07/2023 03/31/23   [provider]  aspirin  EC 81 MG tablet Take 1 tablet (81 mg total) by mouth daily. Swallow whole. Patient not taking: Reported on 12/07/2023 08/12/23   Adelia Homestead, MD  Blood Glucose Monitoring Suppl [facility] VERIO) w/Device KIT Use to check blood sugars twice a day 12/25/22   Adelia Homestead, MD  CHROMIUM PO Take 500 mg by mouth daily at 6 (six) AM.    [provider]  CINNAMON EXTRACT PO Take by mouth daily at 6 (six) AM. 1000 mg    [provider]  glimepiride  (AMARYL ) 2 MG tablet Take 1 tablet (2 mg total) by mouth daily before breakfast. 12/02/23   Adelia Homestead, MD  glucose blood (ONETOUCH VERIO) test strip 1 each by Other route 2 (two) times daily. 08/12/23   Adelia Homestead, MD  ketoconazole  (NIZORAL ) 2 % cream Apply 1 Application topically daily. Patient not taking: Reported on 12/07/2023 04/27/23   Charity Conch, DPM  losartan  (COZAAR ) 25 MG tablet Take 1 tablet (25 mg total) by mouth daily. 08/12/23   Adelia Homestead, MD  metFORMIN  (GLUCOPHAGE ) 1000 MG tablet Take 1 tablet (1,000 mg total) by mouth 2 (two) times daily with a meal. Patient taking differently: Take 1,000 mg by mouth daily at 6 (six) AM. 08/12/23   Adelia Homestead, MD  OneTouch Delica Lancets 33G MISC Use to check blood sugars twice a day 12/25/22   Adelia Homestead, MD  rosuvastatin  (CRESTOR ) 40 MG tablet Take 1 tablet (40 mg total) by mouth daily. 08/12/23   Adelia Homestead, MD  Semaglutide , 1 MG/DOSE, (OZEMPIC , 1 MG/DOSE,) 2 MG/1.5ML SOPN Inject 1 mg into the skin once a week. Patient not taking: Reported on 10/14/2023 04/03/23   Adelia Homestead, MD  Semaglutide , 2 MG/DOSE, (OZEMPIC , 2 MG/DOSE,) 8 MG/3ML SOPN Inject 2 mg into the skin once a week. Patient not taking: Reported on 02/22/2024 05/03/23   Adelia Homestead, MD  tamsulosin (FLOMAX) 0.4 MG CAPS capsule Take 0.4 mg by mouth daily. 01/27/21   [provider]      Allergies    Patient has no known allergies.    Review of Systems   Review of Systems  All other systems reviewed and are negative.   Physical Exam Updated Vital Signs BP (!) 162/95 (BP Location: Right Arm)   Pulse 94   Temp 98.5 F (36.9 C)   Resp 16   Ht 5\' 7"  (1.702 m)   Wt 83.9 kg   SpO2 96%   BMI 28.97 kg/m  Physical Exam Vitals and nursing note reviewed.  Constitutional:      General: He is not in acute distress.    Appearance: Normal appearance.  HENT:  Head: Normocephalic and atraumatic.  Eyes:     General:        Right eye: No discharge.        Left eye: No discharge.  Cardiovascular:     Rate and Rhythm: Normal rate and regular rhythm.     Pulses: Normal pulses.  Pulmonary:     Effort: Pulmonary effort is normal. No respiratory distress.  Musculoskeletal:        General: No deformity.     Comments: Patient without significant swelling or deformity of the left lateral ankle but I can see 2 very small puncture wounds  Skin:    General: Skin is warm and dry.     Capillary Refill: Capillary refill takes less than 2 seconds.     Comments: Although there is some evidence of very minor tissue trauma there is very little evidence for a true envenomation after  reevaluation now 3 hours since the time of injury still with out bruising, bleeding, or significant swelling  Neurological:     Mental Status: He is alert and oriented to person, place, and time.  Psychiatric:        Mood and Affect: Mood normal.        Behavior: Behavior normal.     ED Results / Procedures / Treatments   Labs (all labs ordered are listed, but only abnormal results are displayed) Labs Reviewed  BASIC METABOLIC PANEL WITH GFR - Abnormal; Notable for the following components:      Result Value   Glucose, Bld 193 (*)    All other components within normal limits  CBC WITH DIFFERENTIAL/PLATELET  PROTIME-INR  FIBRINOGEN   CBC WITH DIFFERENTIAL/PLATELET  CBC WITH DIFFERENTIAL/PLATELET  PROTIME-INR  PROTIME-INR  FIBRINOGEN   FIBRINOGEN     EKG None  Radiology No results found.  Procedures Procedures    Medications Ordered in ED Medications - No data to display  ED Course/ Medical Decision Making/ A&P                                 Medical Decision Making  This patient is a 63 y.o. male who presents to the ED for concern of snakebite.   Differential diagnoses prior to evaluation: Local tissue necrosis, severe pain, other effects of envenomation  Past Medical History / Social History / Additional history: Chart reviewed. Pertinent results include: Hypertension, hyperlipidemia depression, anxiety, GERD  Physical Exam: Physical exam performed. The pertinent findings include: Neurovascularly intact left lower extremity, there is some very minor soft tissue swelling and evidence of puncture wounds but overall with very little evidence of tissue necrosis or envenomation.  The wound has not changed in appearance over the course of 3 hours monitoring in the ED.  CBC initially unremarkable, normal fibrinogen , BMP with moderately elevated glucose at 193, for nonfasting lab values.  PT/INR normal range.  Initially with plan to recollect labs at around midnight and  then at 4 AM, but plan to have shared decision-making conversation with patient due to overall very high clinical suspicion that he missed any envenomation.  Medications / Treatment: Will plan to discharge on Augmentin  to cover for animal bite, return precautions given.   Disposition: After consideration of the diagnostic results and the patients response to treatment, I feel that although protocol for repeat CBC, fibrinogen , BMP, PT/INR at 4 to 6 hours I think given monitoring for 3 hours and absolutely no overlying skin changes or localized soft  tissue swelling likely stable for discharge, had a shared decision-making conversation of the patient he is comfortable with this plan, turn precautions given.   emergency department workup does not suggest an emergent condition requiring admission or immediate intervention beyond what has been performed at this time. The plan is: as above. The patient is safe for discharge and has been instructed to return immediately for worsening symptoms, change in symptoms or any other concerns.  Final Clinical Impression(s) / ED Diagnoses Final diagnoses:  Snake bite, initial encounter    Rx / DC Orders ED Discharge Orders     None         Nelly Banco, PA-C 05/05/24 2305    Lowery Rue, DO 05/06/24 0024

## 2024-05-05 NOTE — ED Provider Triage Note (Signed)
 Emergency Medicine Provider Triage Evaluation Note  Curtis Lam , a 63 y.o. male  was evaluated in triage.  Pt complains of a snake bite to left ankle.  Pt took a picture of snake.  [place]  Review of Systems  Positive:  Negative:   Physical Exam  BP (!) 162/95 (BP Location: Right Arm)   Pulse 94   Temp 98.5 F (36.9 C)   Resp 16   SpO2 96%  Gen:   Awake, no distress   Resp:  Normal effort  MSK:   Moves extremities without difficulty.  Pinpoint red area minimal swelling Other:    Medical Decision Making  Medically screening exam initiated at 8:19 PM.  Appropriate orders placed.  Orlene Bjork was informed that the remainder of the evaluation will be completed by another provider, this initial triage assessment does not replace that evaluation, and the importance of remaining in the ED until their evaluation is complete.     Sandi Crosby, PA-C 05/05/24 2020

## 2024-05-05 NOTE — ED Triage Notes (Signed)
 The pt was in his garden when he thinks he was bitten by a poisonous snake he has a small puncture wound  to his lt lateral ankle this occurred approx  30 minutes ago small to no swelling at present

## 2024-05-05 NOTE — Discharge Instructions (Addendum)
 Please keep the wound clean, dry, covered, take the entire course of antibiotics, change the bandage once daily.  If you notice significant worsening tissue damage, pain, swelling he may want to return for further evaluation, if you notice any signs of developing infection you may want to return for further evaluation.

## 2024-05-05 NOTE — ED Notes (Signed)
 Minimal swelling to the lt ankle

## 2024-05-12 DIAGNOSIS — N401 Enlarged prostate with lower urinary tract symptoms: Secondary | ICD-10-CM | POA: Diagnosis not present

## 2024-05-12 DIAGNOSIS — N5201 Erectile dysfunction due to arterial insufficiency: Secondary | ICD-10-CM | POA: Diagnosis not present

## 2024-05-12 DIAGNOSIS — R351 Nocturia: Secondary | ICD-10-CM | POA: Diagnosis not present

## 2024-05-22 ENCOUNTER — Ambulatory Visit: Admitting: Internal Medicine

## 2024-05-22 ENCOUNTER — Other Ambulatory Visit (INDEPENDENT_AMBULATORY_CARE_PROVIDER_SITE_OTHER)

## 2024-05-22 DIAGNOSIS — E1169 Type 2 diabetes mellitus with other specified complication: Secondary | ICD-10-CM | POA: Diagnosis not present

## 2024-05-22 DIAGNOSIS — E785 Hyperlipidemia, unspecified: Secondary | ICD-10-CM

## 2024-05-22 LAB — LIPID PANEL
Cholesterol: 116 mg/dL (ref 0–200)
HDL: 51.7 mg/dL (ref >39.00)
LDL Cholesterol: 42 mg/dL (ref 0–99)
NonHDL: 64
Total CHOL/HDL Ratio: 2
Triglycerides: 111 mg/dL (ref 0.0–149.0)
VLDL: 22.2 mg/dL (ref 0.0–40.0)

## 2024-05-22 LAB — COMPREHENSIVE METABOLIC PANEL WITH GFR
ALT: 45 U/L (ref 0–53)
AST: 32 U/L (ref 0–37)
Albumin: 4.7 g/dL (ref 3.5–5.2)
Alkaline Phosphatase: 44 U/L (ref 39–117)
BUN: 16 mg/dL (ref 6–23)
CO2: 27 meq/L (ref 19–32)
Calcium: 9 mg/dL (ref 8.4–10.5)
Chloride: 104 meq/L (ref 96–112)
Creatinine, Ser: 0.8 mg/dL (ref 0.40–1.50)
GFR: 93.02 mL/min (ref >60.00)
Glucose, Bld: 130 mg/dL — ABNORMAL HIGH (ref 70–99)
Potassium: 4.5 meq/L (ref 3.5–5.1)
Sodium: 137 meq/L (ref 135–145)
Total Bilirubin: 0.5 mg/dL (ref 0.2–1.2)
Total Protein: 7.3 g/dL (ref 6.0–8.3)

## 2024-05-22 LAB — CBC
HCT: 47.1 % (ref 39.0–52.0)
Hemoglobin: 15.8 g/dL (ref 13.0–17.0)
MCHC: 33.5 g/dL (ref 30.0–36.0)
MCV: 91.1 fl (ref 78.0–100.0)
Platelets: 147 10*3/uL — ABNORMAL LOW (ref 150.0–400.0)
RBC: 5.17 Mil/uL (ref 4.22–5.81)
RDW: 13.5 % (ref 11.5–15.5)
WBC: 6.4 10*3/uL (ref 4.0–10.5)

## 2024-05-22 LAB — MICROALBUMIN / CREATININE URINE RATIO
Creatinine,U: 114.9 mg/dL
Microalb Creat Ratio: 6.3 mg/g (ref 0.0–30.0)
Microalb, Ur: 0.7 mg/dL (ref 0.0–1.9)

## 2024-05-22 LAB — HEMOGLOBIN A1C: Hgb A1c MFr Bld: 6.9 % — ABNORMAL HIGH (ref 4.6–6.5)

## 2024-05-25 ENCOUNTER — Ambulatory Visit (INDEPENDENT_AMBULATORY_CARE_PROVIDER_SITE_OTHER): Payer: Self-pay | Admitting: Licensed Clinical Social Worker

## 2024-05-25 DIAGNOSIS — F321 Major depressive disorder, single episode, moderate: Secondary | ICD-10-CM

## 2024-05-25 DIAGNOSIS — F900 Attention-deficit hyperactivity disorder, predominantly inattentive type: Secondary | ICD-10-CM

## 2024-05-25 DIAGNOSIS — F419 Anxiety disorder, unspecified: Secondary | ICD-10-CM
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
## 2024-05-26 ENCOUNTER — Ambulatory Visit: Payer: Self-pay | Admitting: Internal Medicine

## 2024-05-31 ENCOUNTER — Other Ambulatory Visit: Payer: Self-pay | Admitting: Internal Medicine

## 2024-06-02 ENCOUNTER — Encounter: Payer: Self-pay | Admitting: Internal Medicine

## 2024-07-03 ENCOUNTER — Telehealth: Payer: Self-pay

## 2024-07-03 ENCOUNTER — Other Ambulatory Visit: Payer: Self-pay | Admitting: Internal Medicine

## 2024-07-03 NOTE — Telephone Encounter (Signed)
 Called pt and advised patient assistance ozempic  is ready for pick up. Will be at nurse station fridge

## 2024-07-12 ENCOUNTER — Encounter: Payer: Self-pay | Admitting: Internal Medicine

## 2024-07-12 DIAGNOSIS — I7774 Dissection of vertebral artery: Secondary | ICD-10-CM

## 2024-07-13 NOTE — Telephone Encounter (Signed)
**Note De-identified  [place] Obfuscation** Please advise 

## 2024-07-26 ENCOUNTER — Other Ambulatory Visit: Payer: Self-pay

## 2024-07-28 ENCOUNTER — Other Ambulatory Visit: Payer: Self-pay | Admitting: *Deleted

## 2024-07-28 DIAGNOSIS — I7774 Dissection of vertebral artery: Secondary | ICD-10-CM

## 2024-08-09 ENCOUNTER — Telehealth: Payer: Self-pay

## 2024-08-09 NOTE — Telephone Encounter (Signed)
 I have called the patient and LVM that his ozempic  is here at the office and ready to be picked up.

## 2024-08-09 NOTE — Telephone Encounter (Signed)
 Patients assistance for his ozempic  has been placed in the refrigerator.

## 2024-08-14 ENCOUNTER — Encounter: Payer: Self-pay | Admitting: Internal Medicine

## 2024-08-14 ENCOUNTER — Ambulatory Visit: Admitting: Internal Medicine

## 2024-08-14 ENCOUNTER — Ambulatory Visit

## 2024-08-14 VITALS — BP 116/79 | HR 74 | Temp 98.4°F | Ht 67.0 in | Wt 188.6 lb

## 2024-08-14 DIAGNOSIS — Z8673 Personal history of transient ischemic attack (TIA), and cerebral infarction without residual deficits: Secondary | ICD-10-CM

## 2024-08-14 DIAGNOSIS — R419 Unspecified symptoms and signs involving cognitive functions and awareness: Secondary | ICD-10-CM

## 2024-08-14 DIAGNOSIS — Z0000 Encounter for general adult medical examination without abnormal findings: Secondary | ICD-10-CM | POA: Diagnosis not present

## 2024-08-14 DIAGNOSIS — E1169 Type 2 diabetes mellitus with other specified complication: Secondary | ICD-10-CM | POA: Diagnosis not present

## 2024-08-14 DIAGNOSIS — I10 Essential (primary) hypertension: Secondary | ICD-10-CM

## 2024-08-14 DIAGNOSIS — F900 Attention-deficit hyperactivity disorder, predominantly inattentive type: Secondary | ICD-10-CM

## 2024-08-14 DIAGNOSIS — I7774 Dissection of vertebral artery: Secondary | ICD-10-CM

## 2024-08-14 DIAGNOSIS — F331 Major depressive disorder, recurrent, moderate: Secondary | ICD-10-CM | POA: Diagnosis not present

## 2024-08-14 DIAGNOSIS — Z7985 Long-term (current) use of injectable non-insulin antidiabetic drugs: Secondary | ICD-10-CM

## 2024-08-14 DIAGNOSIS — E785 Hyperlipidemia, unspecified: Secondary | ICD-10-CM

## 2024-08-14 DIAGNOSIS — Z7984 Long term (current) use of oral hypoglycemic drugs: Secondary | ICD-10-CM

## 2024-08-14 MED ORDER — GLIMEPIRIDE 1 MG PO TABS: 1.0000 mg | ORAL_TABLET | Freq: Every day | ORAL | 3 refills | Status: DC

## 2024-08-14 NOTE — Assessment & Plan Note (Signed)
 Struggling with memory and concentration.

## 2024-08-14 NOTE — Assessment & Plan Note (Signed)
 Flu shot yearly. Pneumonia due. Shingrix due at pharmacy. Tetanus due at pharmacy. Colonoscopy up to date. Counseled about sun safety and mole surveillance. Counseled about the dangers of distracted driving. Given 10 year screening recommendations.

## 2024-08-14 NOTE — Assessment & Plan Note (Signed)
 Overall stable and seeing mental health for this.

## 2024-08-14 NOTE — Assessment & Plan Note (Signed)
 He is having some low blood sugars. Reduce amaryl  to 1 mg daily new rx done. Continue ozempic  1 mg weekly. He stopped metformin  due to GI intolerance. Labs are up to date reviewed with him. Is on ARB and statin. Reminded about eye exam. Foot exam done.

## 2024-08-14 NOTE — Patient Instructions (Signed)
 We will reduce the glimepiride  to 1 mg daily and have sent in a new prescription. I will check on the ozempic  dosing with the drug company.

## 2024-08-14 NOTE — Assessment & Plan Note (Signed)
 This is still moderate and recurrent. Seeing mental health for management and he is overall satisfied with control.

## 2024-08-14 NOTE — Progress Notes (Signed)
   Subjective:   Patient ID: Curtis Lam, male    DOB: 01/07/1961, 63 y.o.   MRN: [id number]  The patient is here for physical.   PMH, [facility], social history reviewed and updated  Review of Systems  Constitutional: Negative.   HENT: Negative.    Eyes: Negative.   Respiratory:  Negative for cough, chest tightness and shortness of breath.   Cardiovascular:  Negative for chest pain, palpitations and leg swelling.  Gastrointestinal:  Negative for abdominal distention, abdominal pain, constipation, diarrhea, nausea and vomiting.  Musculoskeletal: Negative.   Skin: Negative.   Neurological: Negative.        Memory change  Psychiatric/Behavioral:  Positive for decreased concentration.     Objective:  Physical Exam Constitutional:      Appearance: He is well-developed.  HENT:     Head: Normocephalic and atraumatic.  Cardiovascular:     Rate and Rhythm: Normal rate and regular rhythm.  Pulmonary:     Effort: Pulmonary effort is normal. No respiratory distress.     Breath sounds: Normal breath sounds. No wheezing or rales.  Abdominal:     General: Bowel sounds are normal. There is no distension.     Palpations: Abdomen is soft.     Tenderness: There is no abdominal tenderness. There is no rebound.  Musculoskeletal:     Cervical back: Normal range of motion.  Skin:    General: Skin is warm and dry.     Comments: Foot exam done  Neurological:     Mental Status: He is alert and oriented to person, place, and time. Mental status is at baseline.     Coordination: Coordination normal.     Vitals:   08/14/24 0924  BP: 116/79  Pulse: 74  Temp: 98.4 F (36.9 C)  TempSrc: Oral  SpO2: 97%  Weight: 188 lb 9.6 oz (85.5 kg)  Height: 5' 7 (1.702 m)    Assessment & Plan:

## 2024-08-14 NOTE — Assessment & Plan Note (Signed)
 Stable without signs of progression. BP <130/80 and LDL<70 goals. Taking crestor  daily continue.

## 2024-08-14 NOTE — Assessment & Plan Note (Signed)
 BP at goal on losartan  25 mg daily. Will continue.

## 2024-08-14 NOTE — Assessment & Plan Note (Signed)
 Reviewed recent labs and continue crestor .

## 2024-08-14 NOTE — Assessment & Plan Note (Signed)
 Reviewed recent labs with him. Continue monitoring. No new symptoms. Stable cognitive changes since stroke.

## 2024-08-23 ENCOUNTER — Encounter: Payer: Self-pay | Admitting: Internal Medicine

## 2024-08-23 NOTE — Telephone Encounter (Signed)
OK to send in 

## 2024-08-24 MED ORDER — CONTOUR NEXT TEST VI STRP: ORAL_STRIP | 12 refills | Status: AC

## 2024-08-24 MED ORDER — FREESTYLE LIBRE 3 SENSOR MISC: 1 refills | Status: DC

## 2024-08-24 NOTE — Addendum Note (Signed)
 Addended by: ROSALVA LEX RAMAN on: 08/24/2024 09:18 AM   Modules accepted: Orders

## 2024-08-28 ENCOUNTER — Encounter: Payer: Self-pay | Admitting: Internal Medicine

## 2024-08-28 ENCOUNTER — Ambulatory Visit (INDEPENDENT_AMBULATORY_CARE_PROVIDER_SITE_OTHER): Admitting: Internal Medicine

## 2024-08-28 VITALS — BP 130/82 | HR 70 | Temp 98.1°F | Ht 67.0 in | Wt 188.0 lb

## 2024-08-28 DIAGNOSIS — Z7985 Long-term (current) use of injectable non-insulin antidiabetic drugs: Secondary | ICD-10-CM | POA: Diagnosis not present

## 2024-08-28 DIAGNOSIS — Z23 Encounter for immunization: Secondary | ICD-10-CM

## 2024-08-28 DIAGNOSIS — D696 Thrombocytopenia, unspecified: Secondary | ICD-10-CM | POA: Diagnosis not present

## 2024-08-28 DIAGNOSIS — E1169 Type 2 diabetes mellitus with other specified complication: Secondary | ICD-10-CM | POA: Diagnosis not present

## 2024-08-28 LAB — CBC
HCT: 46.8 % (ref 39.0–52.0)
Hemoglobin: 15.6 g/dL (ref 13.0–17.0)
MCHC: 33.3 g/dL (ref 30.0–36.0)
MCV: 90.5 fl (ref 78.0–100.0)
Platelets: 174 K/uL (ref 150.0–400.0)
RBC: 5.17 Mil/uL (ref 4.22–5.81)
RDW: 12.9 % (ref 11.5–15.5)
WBC: 6.4 K/uL (ref 4.0–10.5)

## 2024-08-28 LAB — HEMOGLOBIN A1C: Hgb A1c MFr Bld: 6.4 % (ref 4.6–6.5)

## 2024-08-28 LAB — VITAMIN B12: Vitamin B-12: 533 pg/mL (ref 211–911)

## 2024-08-28 NOTE — Progress Notes (Signed)
   Subjective:   Patient ID: Curtis Lam, male    DOB: June 25, 1961, 63 y.o.   MRN: [id number]  Discussed the use of AI scribe software for clinical note transcription with the patient, who gave verbal consent to proceed.  History of Present Illness Curtis Lam is a 63 year old male with diabetes who presents with fluctuating blood sugar levels.  He has been experiencing erratic blood sugar levels over the past week, with significant fluctuations including episodes of hypoglycemia followed by spikes. His blood sugar plummets for about ten minutes before rising again. A specific episode involved his blood sugar not exceeding 200 mg/dL but dropping to the 39d, causing dizziness, nausea, and visual disturbances like 'black shadows or blurs.'  He uses a continuous glucose monitor to track his blood sugar levels and has noticed these fluctuations occurring almost daily, except for one day when his levels were stable. He experiences hunger pains and dizziness, particularly when his blood sugar drops to the 60s.  His diabetes management includes glimepiride  and Ozempic . He stopped taking glimepiride  three days ago. There was an issue with the Ozempic  prescription, and he received the wrong dose recently, taking 0.5 mg.  No recent changes in diet or new illnesses that could explain the fluctuations. His last A1c was checked in June, and he is due for another check. He also recalls that his blood platelets were low during his last blood work and requests a recheck.  He plans to travel to [place]  soon for his granddaughter's birthday party, which he is looking forward to.  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 Glimepiride  has been discontinued, though its effects may persist for up to 10 days. A discrepancy in the current Ozempic  dose was noted. There are no changes in diet or illness. Ozempic  may affect weight and appetite, influencing blood sugar. Discontinue glimepiride  and reduce Ozempic  to 0.5 mg for 1-2 weeks. Monitor blood glucose and report via MyChart by week's end. Adjust Ozempic  based on glucose trends after 1-2 weeks. Check hemoglobin A1c level.  Thrombocytopenia   Previous labs showed a low platelet count. Recheck platelet count.  General Health Maintenance   He received a flu vaccination. Administer a flu shot to his companion.  Follow-Up   He has upcoming travel to [place] . Send a MyChart message by week's end to report glucose trends. Follow up post-travel for long-term Ozempic  management.

## 2024-08-28 NOTE — Patient Instructions (Signed)
 We will stop the glimepiride  (amaryl ) and give this one week. If still having sugars <70   Do the 0.5 mg of the ozempic  instead of 1 mg tomorrow. Next week we can decide based on the sugars.

## 2024-08-30 ENCOUNTER — Ambulatory Visit: Payer: Self-pay | Admitting: Internal Medicine

## 2024-08-31 DIAGNOSIS — D696 Thrombocytopenia, unspecified: Secondary | ICD-10-CM | POA: Insufficient documentation

## 2024-08-31 NOTE — Assessment & Plan Note (Signed)
 He is experiencing blood glucose fluctuations with hypoglycemia. Glimepiride  has been discontinued, though its effects may persist for up to 10 days. A discrepancy in the current Ozempic  dose was noted. There are no changes in diet or illness. Ozempic  may affect weight and appetite, influencing blood sugar. Discontinue glimepiride  and reduce Ozempic  to 0.5 mg for 1-2 weeks. Monitor blood glucose and report via MyChart by week's end. Adjust Ozempic  based on glucose trends after 1-2 weeks. Check hemoglobin A1c level.

## 2024-08-31 NOTE — Progress Notes (Unsigned)
 Patient ID: ALDIN DREES, male   DOB: Sep 02, 1961, 63 y.o.   MRN: [id number]  Reason for Consult: No chief complaint on file.   Referred by Rollene Almarie DELENA, *  Subjective:     HPI VINCIENT VANAMAN is a 63 y.o. male with carotid artery disease who had a right CEA at [facility] in April 2024 due to amaurosis. He has recovered well and denies any new neurologic symptoms.  Specifically denies any one-sided weakness, numbness, amaurosis or speech issues.  He continues to be compliant with aspirin  and Crestor .  He is a never smoker.  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   Family History  Problem Relation Age of Onset   Alzheimer's disease Father    Depression Sister    Cancer Paternal Aunt        uterine, ovarian   Diabetes Maternal Grandfather    Heart disease Maternal Grandfather    Diabetes Son 58   Colon cancer Neg Hx    Colon polyps Neg Hx    Esophageal cancer Neg Hx    Rectal cancer Neg Hx    Stomach cancer Neg Hx    Past Surgical History:  Procedure Laterality Date   Carotdid artery surgery Right    COLONOSCOPY  01/02/2013   TONSILLECTOMY     VASECTOMY      Short Social History:  Social History   Tobacco Use   Smoking status: Never   Smokeless tobacco: Never  Substance Use Topics   Alcohol use: Yes    Alcohol/week: 1.0 standard drink of alcohol    Types: 1 Cans of beer per week    Comment: 1 beer or wine 2-3 times per month, more in summer than winter    No Known Allergies  Current Outpatient Medications  Medication Sig Dispense Refill   aspirin  EC 81 MG tablet Take 1 tablet (81 mg total) by mouth daily. Swallow whole. 90 tablet 3   b complex vitamins  capsule Take 1 capsule by mouth daily.     Blood Glucose Monitoring Suppl (ONETOUCH VERIO) w/Device KIT Use to check blood sugars twice a day 1 kit 0   CHROMIUM PO Take 500 mg by mouth daily at 6 (six) AM.     Continuous Glucose Sensor (FREESTYLE LIBRE 3 SENSOR) MISC Place 1 sensor on the skin every 14 days. Use to check glucose continuously 2 each 1   glimepiride  (AMARYL ) 1 MG tablet Take 1 tablet (1 mg total) by mouth daily with breakfast. 90 tablet 3   glucose blood (CONTOUR NEXT TEST) test strip Use as instructed 100 each 12   glucose blood (ONETOUCH VERIO) test strip 1 each by Other route 2 (two) times daily. 100 each 11   ketoconazole  (NIZORAL ) 2 % cream Apply 1 Application topically daily. 60 g 0   losartan  (COZAAR ) 25 MG tablet Take 1 tablet (25 mg total) by mouth daily. 90 tablet 3   OneTouch Delica Lancets 33G MISC Use to check blood sugars twice a day 100 each 0   rosuvastatin  (CRESTOR ) 40 MG tablet TAKE 1 TABLET BY MOUTH ONCE DAILY 90 tablet  1   Semaglutide , 1 MG/DOSE, (OZEMPIC , 1 MG/DOSE,) 2 MG/1.5ML SOPN Inject 1 mg into the skin once a week. 3 mL 0   tamsulosin (FLOMAX) 0.4 MG CAPS capsule Take 0.4 mg by mouth daily.     No current facility-administered medications for this visit.    REVIEW OF SYSTEMS  All other systems were reviewed and are negative     Objective:  Objective   There were no vitals filed for this visit. There is no height or weight on file to calculate BMI.  Physical Exam General: no acute distress Cardiac: hemodynamically stable Pulm: normal work of breathing Abdomen: non-tender, no pulsatile mass*** Neuro: alert, no focal deficit Extremities: no edema, cyanosis or wounds*** Vascular:   Right: ***  Left: ***  Data: Carotid duplex ***     Assessment/Plan:   MARTIN BELLING is a 63 y.o. male with carotid artery disease.  He underwent right CEA at [facility] in April 2024 for symptomatic stenosis with amaurosis.  He has recovered well and is  currently asymptomatic and denies any new neurologic deficits. Will plan to continue surveillance with a repeat carotid duplex in April 2026 which would put him at 2 years postop and at that time if it is stable we will plan for annual follow-up.     Norman GORMAN Serve MD [facility]

## 2024-08-31 NOTE — Assessment & Plan Note (Signed)
 Platelets mildly low on last labs. Checking CBC today.

## 2024-09-01 ENCOUNTER — Ambulatory Visit (HOSPITAL_COMMUNITY): Admitting: Vascular Surgery

## 2024-09-01 ENCOUNTER — Encounter: Payer: Self-pay | Admitting: Vascular Surgery

## 2024-09-01 ENCOUNTER — Ambulatory Visit: Admitting: Internal Medicine

## 2024-09-01 ENCOUNTER — Ambulatory Visit (HOSPITAL_COMMUNITY)
Admission: RE | Admit: 2024-09-01 | Discharge: 2024-09-01 | Disposition: A | Discharge status: Home / Self Care | Source: Ambulatory Visit | Attending: Vascular Surgery | Admitting: Vascular Surgery

## 2024-09-01 VITALS — BP 146/77 | HR 65 | Temp 98.1°F | Ht 67.0 in | Wt 184.0 lb

## 2024-09-01 DIAGNOSIS — I7774 Dissection of vertebral artery: Secondary | ICD-10-CM | POA: Diagnosis not present

## 2024-09-01 DIAGNOSIS — I6521 Occlusion and stenosis of right carotid artery: Secondary | ICD-10-CM | POA: Diagnosis not present

## 2024-09-05 ENCOUNTER — Other Ambulatory Visit: Payer: Self-pay | Admitting: Internal Medicine

## 2024-09-06 LAB — COMPREHENSIVE METABOLIC PANEL WITH GFR: eGFR: >90

## 2024-09-06 LAB — BASIC METABOLIC PANEL WITH GFR: Creatinine: 0.7 (ref 0.6–1.3)

## 2024-09-06 LAB — MICROALBUMIN / CREATININE URINE RATIO: Microalb Creat Ratio: <30

## 2024-09-14 ENCOUNTER — Encounter: Payer: Self-pay | Admitting: Internal Medicine

## 2024-09-15 ENCOUNTER — Encounter: Payer: Self-pay | Admitting: Internal Medicine

## 2024-09-22 ENCOUNTER — Encounter: Payer: Self-pay | Admitting: Pharmacist

## 2024-10-03 DIAGNOSIS — G8929 Other chronic pain: Secondary | ICD-10-CM | POA: Diagnosis not present

## 2024-10-11 ENCOUNTER — Encounter: Payer: Self-pay | Admitting: Internal Medicine

## 2024-10-11 MED ORDER — FREESTYLE LIBRE 3 PLUS SENSOR MISC: 3 refills | Status: AC

## 2024-10-11 NOTE — Addendum Note (Signed)
 Addended by: ROSALVA LEX RAMAN on: 10/11/2024 03:27 PM   Modules accepted: Orders

## 2024-10-19 ENCOUNTER — Ambulatory Visit

## 2024-10-20 ENCOUNTER — Ambulatory Visit (INDEPENDENT_AMBULATORY_CARE_PROVIDER_SITE_OTHER)

## 2024-10-20 VITALS — Ht 67.0 in | Wt 184.0 lb

## 2024-10-20 DIAGNOSIS — Z0000 Encounter for general adult medical examination without abnormal findings: Secondary | ICD-10-CM

## 2024-10-20 NOTE — Patient Instructions (Addendum)
 [name],  Thank you for taking the time for your Medicare Wellness Visit. I appreciate your continued commitment to your health goals. Please review the care plan we discussed, and feel free to reach out if I can assist you further.  Medicare recommends these wellness visits once per year to help you and your care team stay ahead of potential health issues. These visits are designed to focus on prevention, allowing your provider to concentrate on managing your acute and chronic conditions during your regular appointments.  Please note that Annual Wellness Visits do not include a physical exam. Some assessments may be limited, especially if the visit was conducted virtually. If needed, we may recommend a separate in-person follow-up with your provider.  Ongoing Care Seeing your primary care provider every 3 to 6 months helps us  monitor your health and provide consistent, personalized care.   Referrals If a referral was made during today's visit and you haven't received any updates within two weeks, please contact the referred provider directly to check on the status.  Recommended Screenings:  Health Maintenance  Topic Date Due   Pneumococcal Vaccine for age over 13 (2 of 2 - PPSV23, PCV20, or PCV21) 10/01/2017   COVID-19 Vaccine (3 - Pfizer risk series) 05/14/2020   Zoster (Shingles) Vaccine (2 of 2) 10/30/2020   DTaP/Tdap/Td vaccine (3 - Td or Tdap) 01/12/2023   Eye exam for diabetics  10/16/2023   Hemoglobin A1C  02/25/2025   Complete foot exam   08/14/2025   Yearly kidney function blood test for diabetes  09/06/2025   Yearly kidney health urinalysis for diabetes  09/06/2025   Medicare Annual Wellness Visit  10/20/2025   Colon Cancer Screening  02/22/2027   Flu Shot  Completed   Hepatitis C Screening  Completed   HIV Screening  Completed   Hepatitis B Vaccine  Aged Out   Meningitis B Vaccine  Aged Out       10/20/2024   12:37 PM  Advanced Directives  Does Patient Have a  Medical Advance Directive? Yes  Type of Estate Agent of [place];Living will  Does patient want to make changes to medical advance directive? No - Patient declined  Copy of Healthcare Power of Attorney in Chart? Yes - validated most recent copy scanned in chart (See row information)   Advance Care Planning is important because it: Ensures you receive medical care that aligns with your values, goals, and preferences. Provides guidance to your family and loved ones, reducing the emotional burden of decision-making during critical moments.  Vision: Annual vision screenings are recommended for early detection of glaucoma, cataracts, and diabetic retinopathy. These exams can also reveal signs of chronic conditions such as diabetes and high blood pressure.  Dental: Annual dental screenings help detect early signs of oral cancer, gum disease, and other conditions linked to overall health, including heart disease and diabetes.

## 2024-10-20 NOTE — Progress Notes (Signed)
 Subjective:  Please attest and cosign this visit due to patients primary care provider not being in the office at the time the visit was completed.  (Pt of Dr Almarie Cleveland)   Curtis Lam is a 63 y.o. who presents for a Medicare Wellness preventive visit.  As a reminder, Annual Wellness Visits don't include a physical exam, and some assessments may be limited, especially if this visit is performed virtually. We may recommend an in-person follow-up visit with your provider if needed.  Visit Complete: Virtual I connected with  Ozell DELENA Stanford on 10/20/24 by a audio enabled telemedicine application and verified that I am speaking with the correct person using two identifiers.  Patient Location: Home  Provider Location: Office/Clinic  I discussed the limitations of evaluation and management by telemedicine. The patient expressed understanding and agreed to proceed.  Vital Signs: Because this visit was a virtual/telehealth visit, some criteria may be missing or patient reported. Any vitals not documented were not able to be obtained and vitals that have been documented are patient reported.  VideoDeclined- This patient declined Librarian, academic. Therefore the visit was completed with audio only.  Persons Participating in Visit: Patient.  AWV Questionnaire: No: Patient Medicare AWV questionnaire was not completed prior to this visit.  Cardiac Risk Factors include: advanced age (>36men, >63 women);diabetes mellitus;dyslipidemia;hypertension;male gender     Objective:    Today's Vitals   10/20/24 1238  Weight: 184 lb (83.5 kg)  Height: 5' 7 (1.702 m)   Body mass index is 28.82 kg/m.     10/20/2024   12:37 PM 08/14/2024    9:21 AM 05/05/2024    9:47 PM 05/05/2024    8:22 PM 01/11/2019   12:39 PM 05/31/2017    3:44 PM 05/31/2013    2:39 PM  Advanced Directives  Does Patient Have a Medical Advance Directive? Yes Yes No No  No  Patient does  not have advance directive   Type of Advance Directive Healthcare Power of [place];Living will Healthcare Power of [place];Living will       Does patient want to make changes to medical advance directive? No - Patient declined        Copy of Healthcare Power of Attorney in Chart? Yes - validated most recent copy scanned in chart (See row information) Yes - validated most recent copy scanned in chart (See row information)       Would patient like information on creating a medical advance directive?      No - Patient declined    Pre-existing out of facility DNR order (yellow form or pink MOST form)       No      Information is confidential and restricted. Go to Review Flowsheets to unlock data.   Data saved with a previous flowsheet row definition    Current Medications (verified) Outpatient Encounter Medications as of 10/20/2024  Medication Sig   b complex vitamins capsule Take 1 capsule by mouth daily.   Blood Glucose Monitoring Suppl (ONETOUCH VERIO) w/Device KIT Use to check blood sugars twice a day   CHROMIUM PO Take 500 mg by mouth daily at 6 (six) AM.   Continuous Glucose Sensor (FREESTYLE LIBRE 3 PLUS SENSOR) MISC Change sensor every 15 days.   Continuous Glucose Sensor (FREESTYLE LIBRE 3 SENSOR) MISC Place 1 sensor on the skin every 14 days. Use to check glucose continuously   glucose blood (CONTOUR NEXT TEST) test strip Use as instructed   losartan  (  COZAAR ) 25 MG tablet TAKE 1 TABLET BY MOUTH ONCE DAILY   OneTouch Delica Lancets 33G MISC Use to check blood sugars twice a day   rosuvastatin  (CRESTOR ) 40 MG tablet TAKE 1 TABLET BY MOUTH ONCE DAILY   Semaglutide , 1 MG/DOSE, (OZEMPIC , 1 MG/DOSE,) 2 MG/1.5ML SOPN Inject 1 mg into the skin once a week. (Patient taking differently: Inject 0.5 mg into the skin once a week.)   tamsulosin (FLOMAX) 0.4 MG CAPS capsule Take 0.4 mg by mouth daily.   [DISCONTINUED] glimepiride  (AMARYL ) 1 MG tablet Take 1 tablet (1 mg total) by mouth daily with  breakfast. (Patient not taking: Reported on 09/01/2024)   No facility-administered encounter medications on file as of 10/20/2024.    Allergies (verified) Patient has no known allergies.   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   Past Surgical History:  Procedure Laterality Date   Carotdid artery surgery Right    COLONOSCOPY  01/02/2013   TONSILLECTOMY     VASECTOMY     Family History  Problem Relation Age of Onset   Alzheimer's disease Father    Depression Sister    Cancer Paternal Aunt        uterine, ovarian   Diabetes Maternal Grandfather    Heart disease Maternal Grandfather    Diabetes Son 30   Colon cancer Neg Hx    Colon polyps Neg Hx    Esophageal cancer Neg Hx    Rectal cancer Neg Hx    Stomach cancer Neg Hx    Social History   Socioeconomic History   Marital status: Married    Spouse name: Not on file   Number of children: 2   Years of education: Not on file   Highest education level: 12th grade  Occupational History   Occupation: Engineer, Mining)    Employer: [employer]  Tobacco Use   Smoking status: Never   Smokeless tobacco: Never  Vaping Use   Vaping status: Never Used  Substance and Sexual Activity   Alcohol use: Yes    Alcohol/week: 1.0 standard drink of alcohol    Types: 1 Cans of beer per week    Comment: 1 beer or wine 2-3 times per month, more in summer than winter   Drug use: No   Sexual activity: Yes    Partners: Female  Other Topics Concern   Not on file  Social History Narrative   Lives with wife and son.  Other son lives in [place].  Wife suffers from depression, recent flare and hospitalization (12/2013); He lost his job 09/2014  for 4-5 weeks, and got new job   Social Drivers of Corporate Investment Banker Strain: Low Risk  (10/20/2024)   Overall Financial Resource Strain (CARDIA)    Difficulty of Paying Living Expenses: Not hard at all  Food Insecurity: No Food Insecurity (10/20/2024)   Hunger Vital Sign    Worried About Running Out of Food in the Last Year: Never true    Ran Out of Food in the Last Year: Never true  Transportation Needs: No Transportation Needs (10/20/2024)   PRAPARE - Transportation  Lack of Transportation (Medical): No    Lack of Transportation (Non-Medical): No  Physical Activity: Inactive (10/20/2024)   Exercise Vital Sign    Days of Exercise per Week: 0 days    Minutes of Exercise per Session: 0 min  Stress: No Stress Concern Present (10/20/2024)   [employer] of Occupational Health - Occupational Stress Questionnaire    Feeling of Stress: Only a little  Social Connections: Moderately Isolated (10/20/2024)   Social Connection and Isolation Panel    Frequency of Communication with Friends and Family: Twice a week    Frequency of Social Gatherings with Friends and Family: Once a week    Attends Religious Services: Never    Database Administrator or Organizations: No    Attends Engineer, Structural: Never    Marital Status: Married    Tobacco Counseling Counseling given: Not Answered    Clinical Intake:  Pre-visit preparation completed: Yes  Pain : No/denies pain     BMI - recorded: 28.82 Nutritional Status: BMI 25 -29 Overweight Nutritional Risks: None Diabetes: Yes CBG done?: Yes CBG resulted in Enter/ Edit results?: Yes (fasting - 109) Did pt. bring in CBG monitor from home?: No  Lab Results  Component Value Date   HGBA1C 6.4 08/28/2024   HGBA1C 6.9 (H) 05/22/2024   HGBA1C 8.6 12/02/2023     How often do you need to have someone help you when you read instructions, pamphlets, or other written materials from your doctor or pharmacy?: 3 -  Sometimes (Spouse helps)  Interpreter Needed?: No  Information entered by :: Verdie Saba, CMA   Activities of Daily Living     10/20/2024   12:44 PM 08/14/2024    9:17 AM  In your present state of health, do you have any difficulty performing the following activities:  Hearing? 1 1  Comment wears hearing aids - plans to change devices   Vision? 0 0  Difficulty concentrating or making decisions? 1 1  Comment Spouse helps   Walking or climbing stairs? 0 1  Dressing or bathing? 0 0  Doing errands, shopping? 0 1  Preparing Food and eating ? N N  Using the Toilet? N N  In the past six months, have you accidently leaked urine? Y N  Do you have problems with loss of bowel control? Y Y  Managing your Medications? Y Y  Comment Spouse helps   Managing your Finances? N N  Housekeeping or managing your Housekeeping? CINDERELLA SAILOR  Comment Spouse helps     Patient Care Team: Rollene Almarie LABOR, MD as PCP - General (Internal Medicine)  I have updated your Care Teams any recent Medical Services you may have received from other providers in the past year.     Assessment:   This is a routine wellness examination for Abraham.  Hearing/Vision screen Hearing Screening - Comments:: Wears hearing aids Vision Screening - Comments:: Wears rx glasses - up to date with routine eye exams    Goals Addressed               This Visit's Progress     Patient Stated (pt-stated)        Patient stated he monitor his sugar readings       Depression Screen     10/20/2024   12:46 PM 08/14/2024    9:42 AM 05/25/2024   11:32 AM 12/02/2023    8:31 AM 08/12/2023    8:13 AM 02/02/2023    8:33 AM  10/19/2022   11:12 AM  PHQ 2/9 Scores  PHQ - 2 Score 2 2  0 0 0 4  PHQ- 9 Score 5 10  0 0 0 16     Information is confidential and restricted. Go to Review Flowsheets to unlock data.    Fall Risk     10/20/2024   12:45 PM 08/28/2024    2:58 PM 08/14/2024    9:22 AM 12/02/2023    8:28 AM 08/12/2023     8:13 AM  Fall Risk   Falls in the past year? 1 0 1 0 0  Number falls in past yr: 0 0 1 0 0  Comment 2      Injury with Fall? 0 0 0 0 0  Risk for fall due to :   Impaired balance/gait    Follow up Falls evaluation completed;Falls prevention discussed Falls evaluation completed Falls evaluation completed;Falls prevention discussed Falls evaluation completed Falls evaluation completed    MEDICARE RISK AT HOME:  Medicare Risk at Home Any stairs in or around the home?: Yes If so, are there any without handrails?: No Home free of loose throw rugs in walkways, pet beds, electrical cords, etc?: Yes Adequate lighting in your home to reduce risk of falls?: Yes Life alert?: No Use of a cane, walker or w/c?: Yes (cane) Grab bars in the bathroom?: Yes Shower chair or bench in shower?: Yes Elevated toilet seat or a handicapped toilet?: Yes  TIMED UP AND GO:  Was the test performed?  No  Cognitive Function: 6CIT completed    08/14/2024    9:23 AM 01/20/2019   10:38 AM  MMSE - Mini Mental State Exam  Orientation to time 5   Orientation to Place 5   Registration 3   Attention/ Calculation 5   Recall 3   Language- name 2 objects 2   Language- repeat 1   Language- follow 3 step command 3   Language- read & follow direction 1   Write a sentence 1   Copy design 1   Total score 30      Information is confidential and restricted. Go to Review Flowsheets to unlock data.        10/20/2024   12:54 PM 08/14/2024    9:23 AM  6CIT Screen  What Year? 0 points 4 points  What month? 0 points 3 points  What time? 0 points 3 points  Count back from 20 0 points 0 points  Months in reverse 0 points 0 points  Repeat phrase 2 points 0 points  Total Score 2 points 10 points    Immunizations Immunization History  Administered Date(s) Administered   INFLUENZA, HIGH DOSE SEASONAL PF 08/28/2024   Influenza Split 08/21/2012   Influenza Whole 08/21/2009, 09/18/2011   Influenza, Seasonal, Injecte,  Preservative Fre 12/02/2023   Influenza,inj,Quad PF,6+ Mos 12/25/2013, 01/23/2015, 08/13/2016, 10/04/2018, 09/04/2019, 10/19/2022   Influenza-Unspecified 09/04/2020   PFIZER(Purple Top)SARS-COV-2 Vaccination 03/23/2020, 04/16/2020   Pneumococcal Conjugate-13 08/06/2017   Td 12/22/2003   Tdap 01/12/2013   Zoster Recombinant(Shingrix) 09/04/2020    Screening Tests Health Maintenance  Topic Date Due   Pneumococcal Vaccine: 50+ Years (2 of 2 - PPSV23, PCV20, or PCV21) 10/01/2017   COVID-19 Vaccine (3 - Pfizer risk series) 05/14/2020   Zoster Vaccines- Shingrix (2 of 2) 10/30/2020   DTaP/Tdap/Td (3 - Td or Tdap) 01/12/2023   OPHTHALMOLOGY EXAM  10/16/2023   HEMOGLOBIN A1C  02/25/2025   FOOT EXAM  08/14/2025   Diabetic kidney  evaluation - eGFR measurement  09/06/2025   Diabetic kidney evaluation - Urine ACR  09/06/2025   Medicare Annual Wellness (AWV)  10/20/2025   Colonoscopy  02/22/2027   Influenza Vaccine  Completed   Hepatitis C Screening  Completed   HIV Screening  Completed   Hepatitis B Vaccines 19-59 Average Risk  Aged Out   Meningococcal B Vaccine  Aged Out    Health Maintenance Items Addressed:  10/20/2024  Additional Screening:  Vision Screening: Recommended annual ophthalmology exams for early detection of glaucoma and other disorders of the eye. Is the patient up to date with their annual eye exam?  Yes  Who is the provider or what is the name of the office in which the patient attends annual eye exams? Pt does not recall the name of the Optometrist  Dental Screening: Recommended annual dental exams for proper oral hygiene  Community Resource Referral / Chronic Care Management: CRR required this visit?  No   CCM required this visit?  No   Plan:    I have personally reviewed and noted the following in the patient's chart:   Medical and social history Use of alcohol, tobacco or illicit drugs  Current medications and supplements including opioid  prescriptions. Patient is not currently taking opioid prescriptions. Functional ability and status Nutritional status Physical activity Advanced directives List of other physicians Hospitalizations, surgeries, and ER visits in previous 12 months Vitals Screenings to include cognitive, depression, and falls Referrals and appointments  In addition, I have reviewed and discussed with patient certain preventive protocols, quality metrics, and best practice recommendations. A written personalized care plan for preventive services as well as general preventive health recommendations were provided to patient.   Verdie CHRISTELLA Saba, CMA   10/20/2024   After Visit Summary: (MyChart) Due to this being a telephonic visit, the after visit summary with patients personalized plan was offered to patient via MyChart   Notes: Scheduled a 3-mth Diabetes f/u & discuss Sleep Apnea sxs.

## 2024-11-13 DIAGNOSIS — R351 Nocturia: Secondary | ICD-10-CM | POA: Diagnosis not present

## 2024-11-13 DIAGNOSIS — N528 Other male erectile dysfunction: Secondary | ICD-10-CM | POA: Diagnosis not present

## 2024-11-15 ENCOUNTER — Ambulatory Visit: Admitting: Internal Medicine

## 2024-11-15 VITALS — BP 100/62 | HR 65 | Temp 98.3°F | Ht 67.0 in | Wt 191.0 lb

## 2024-11-15 DIAGNOSIS — F331 Major depressive disorder, recurrent, moderate: Secondary | ICD-10-CM | POA: Diagnosis not present

## 2024-11-15 DIAGNOSIS — E785 Hyperlipidemia, unspecified: Secondary | ICD-10-CM | POA: Diagnosis not present

## 2024-11-15 DIAGNOSIS — Z23 Encounter for immunization: Secondary | ICD-10-CM | POA: Diagnosis not present

## 2024-11-15 DIAGNOSIS — Z7985 Long-term (current) use of injectable non-insulin antidiabetic drugs: Secondary | ICD-10-CM

## 2024-11-15 DIAGNOSIS — E1169 Type 2 diabetes mellitus with other specified complication: Secondary | ICD-10-CM | POA: Diagnosis not present

## 2024-11-15 NOTE — Assessment & Plan Note (Signed)
 Checking lipid panel and not taking statin currently.

## 2024-11-15 NOTE — Assessment & Plan Note (Signed)
 Checking labs when due in a few weeks ordered today. He is taking ozempic  1 mg weekly reviewed freestyle libre during visit estimated HgA1c 6.1%.

## 2024-11-15 NOTE — Assessment & Plan Note (Signed)
 He is having more fatigue but denies change in mood overall. Encouraged him to do some physical exercise to help with fatigue.

## 2024-11-15 NOTE — Patient Instructions (Addendum)
 We have given you the pneumonia vaccine today and you will have to get the shingles vaccine.  We will check the labs 12/9 or later so you can come back for that.

## 2024-11-15 NOTE — Progress Notes (Signed)
 Subjective:   Patient ID: Curtis Lam, male    DOB: 14-Aug-1961, 63 y.o.   MRN: [id number]  Discussed the use of AI scribe software for clinical note transcription with the patient, who gave verbal consent to proceed.  History of Present Illness Curtis Lam is a 63 year old male with diabetes who presents with low energy and sleep disturbances.  He reports low energy and feeling 'down', and describes both physical tiredness and poor sleep. 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 Attempts to rest during the day are insufficient compared to a full night's sleep.  His blood sugar levels have been relatively stable, with 96% of the time in range over the past week. He has experienced episodes of hypoglycemia, particularly at night, with alarms going off at levels around 65 mg/dL. During a trip to [place], he experienced shaking and required candy to stabilize his blood sugar. He is currently off glimepiride  and is on 1 mg of Ozempic . His recent GMI is 6.1, and his 90-day average is 6.3.  He discusses dietary influences on his blood sugar, noting that certain foods like home-cooked pizzas and potato chips can cause spikes, while others like [employer] Pizza do not. He has been experimenting with different foods to manage his blood sugar, including caffeine-free tea with honey and low-carb wraps.  He reports very little physical activity due to fatigue, although he has attempted some yard work recently. He wants to try Tai Chi to increase his activity level without straining his joints.  Review of Systems  Constitutional:  Positive for fatigue. Negative for activity change and appetite change.  HENT: Negative.    Eyes: Negative.   Respiratory:  Negative for cough, chest tightness and shortness of breath.   Cardiovascular:  Negative for chest pain, palpitations and leg swelling.  Gastrointestinal:  Negative for abdominal  distention, abdominal pain, constipation, diarrhea, nausea and vomiting.  Musculoskeletal: Negative.   Skin: Negative.   Neurological: Negative.   Psychiatric/Behavioral: Negative.      Objective:  Physical Exam Constitutional:      Appearance: He is well-developed.  HENT:     Head: Normocephalic and atraumatic.  Cardiovascular:     Rate and Rhythm: Normal rate and regular rhythm.  Pulmonary:     Effort: Pulmonary effort is normal. No respiratory distress.     Breath sounds: Normal breath sounds. No wheezing or rales.  Abdominal:     General: Bowel sounds are normal. There is no distension.     Palpations: Abdomen is soft.     Tenderness: There is no abdominal tenderness.  Musculoskeletal:     Cervical back: Normal range of motion.  Skin:    General: Skin is warm and dry.  Neurological:     Mental Status: He is alert and oriented to person, place, and time.     Coordination: Coordination normal.     Vitals:   11/15/24 1044  BP: 100/62  Pulse: 65  Temp: 98.3 F (36.8 C)  TempSrc: Oral  SpO2: 97%  Weight: 191 lb (86.6 kg)  Height: 5' 7 (1.702 m)  Prevnar 20 given at visit  Assessment and Plan Assessment & Plan Type 2 diabetes with complication   Type 2 diabetes is generally well-controlled with 96% of blood glucose levels in range, though there are episodes of hypoglycemia. An increase in Ozempic  to 1 mg was recently made due to rising blood glucose levels. Occasional hypoglycemic  episodes occur, influenced by dietary and hormonal factors. Continue Ozempic  1 mg. Blood work is ordered in a couple of weeks. Dietary management is encouraged to avoid foods that spike blood glucose. Gentle exercise such as Tai Chi is recommended.

## 2024-11-28 ENCOUNTER — Encounter: Payer: Self-pay | Admitting: Internal Medicine

## 2024-11-28 DIAGNOSIS — H25013 Cortical age-related cataract, bilateral: Secondary | ICD-10-CM | POA: Diagnosis not present

## 2024-11-28 DIAGNOSIS — E119 Type 2 diabetes mellitus without complications: Secondary | ICD-10-CM | POA: Diagnosis not present

## 2024-11-28 DIAGNOSIS — H2513 Age-related nuclear cataract, bilateral: Secondary | ICD-10-CM | POA: Diagnosis not present

## 2024-11-28 DIAGNOSIS — H524 Presbyopia: Secondary | ICD-10-CM | POA: Diagnosis not present

## 2024-11-29 ENCOUNTER — Other Ambulatory Visit (INDEPENDENT_AMBULATORY_CARE_PROVIDER_SITE_OTHER)

## 2024-11-29 DIAGNOSIS — E1169 Type 2 diabetes mellitus with other specified complication: Secondary | ICD-10-CM

## 2024-11-29 LAB — COMPREHENSIVE METABOLIC PANEL WITH GFR
ALT: 27 U/L (ref 0–53)
AST: 22 U/L (ref 0–37)
Albumin: 4.2 g/dL (ref 3.5–5.2)
Alkaline Phosphatase: 43 U/L (ref 39–117)
BUN: 20 mg/dL (ref 6–23)
CO2: 29 meq/L (ref 19–32)
Calcium: 8.9 mg/dL (ref 8.4–10.5)
Chloride: 101 meq/L (ref 96–112)
Creatinine, Ser: 0.93 mg/dL (ref 0.40–1.50)
GFR: 85.99 mL/min (ref >60.00)
Glucose, Bld: 152 mg/dL — ABNORMAL HIGH (ref 70–99)
Potassium: 4.1 meq/L (ref 3.5–5.1)
Sodium: 136 meq/L (ref 135–145)
Total Bilirubin: 0.5 mg/dL (ref 0.2–1.2)
Total Protein: 6.7 g/dL (ref 6.0–8.3)

## 2024-11-29 LAB — CBC
HCT: 45.7 % (ref 39.0–52.0)
Hemoglobin: 15.8 g/dL (ref 13.0–17.0)
MCHC: 34.5 g/dL (ref 30.0–36.0)
MCV: 91.4 fl (ref 78.0–100.0)
Platelets: 153 K/uL (ref 150.0–400.0)
RBC: 5 Mil/uL (ref 4.22–5.81)
RDW: 12.8 % (ref 11.5–15.5)
WBC: 4.9 K/uL (ref 4.0–10.5)

## 2024-11-29 LAB — LIPID PANEL
Cholesterol: 191 mg/dL (ref 0–200)
HDL: 33.9 mg/dL — ABNORMAL LOW (ref >39.00)
LDL Cholesterol: 95 mg/dL (ref 0–99)
NonHDL: 157.45
Total CHOL/HDL Ratio: 6
Triglycerides: 314 mg/dL — ABNORMAL HIGH (ref 0.0–149.0)
VLDL: 62.8 mg/dL — ABNORMAL HIGH (ref 0.0–40.0)

## 2024-11-29 LAB — HEMOGLOBIN A1C: Hgb A1c MFr Bld: 6 % (ref 4.6–6.5)

## 2024-11-30 LAB — OPHTHALMOLOGY REPORT-SCANNED

## 2024-12-04 ENCOUNTER — Ambulatory Visit: Payer: Self-pay | Admitting: Internal Medicine

## 2025-01-16 ENCOUNTER — Telehealth: Payer: Self-pay

## 2025-01-16 ENCOUNTER — Other Ambulatory Visit (HOSPITAL_COMMUNITY): Payer: Self-pay

## 2025-01-16 NOTE — Telephone Encounter (Signed)
 Pharmacy Patient Advocate Encounter   Received notification from [facility] KEY that prior authorization for Ozempic  2 is required/requested.   Insurance verification completed.   The patient is insured through [facility] ADVANTAGE/RX ADVANCE.   Per test claim: PA required; PA submitted to above mentioned insurance via Latent Key/confirmation #/EOC [id number] Status is pending

## 2025-01-19 ENCOUNTER — Other Ambulatory Visit (HOSPITAL_COMMUNITY): Payer: Self-pay

## 2025-01-19 NOTE — Telephone Encounter (Signed)
 Pt is aware of approval for Ozempic 

## 2025-01-23 ENCOUNTER — Encounter: Payer: Self-pay | Admitting: Internal Medicine

## 2025-01-24 ENCOUNTER — Other Ambulatory Visit (HOSPITAL_COMMUNITY): Payer: Self-pay

## 2025-01-25 ENCOUNTER — Telehealth: Payer: Self-pay

## 2025-01-25 NOTE — Telephone Encounter (Signed)
 Copied from CRM #[id number]. Topic: Appointments - Transfer of Care >> Jan 25, 2025  9:35 AM Pinkey ORN wrote: Pt is requesting to transfer FROM: Rollene Almarie LABOR, MD Pt is requesting to transfer TO: Cleatus Debby Specking, MD Reason for requested transfer: Patient didn't disclose information It is the responsibility of the team the patient would like to transfer to [facility], MD) to reach out to the patient if for any reason this transfer is not acceptable.

## 2025-01-26 ENCOUNTER — Other Ambulatory Visit (HOSPITAL_COMMUNITY): Payer: Self-pay

## 2025-02-12 ENCOUNTER — Encounter: Admitting: Student in an Organized Health Care Education/Training Program

## 2025-11-19 ENCOUNTER — Ambulatory Visit

## 2025-11-19 ENCOUNTER — Encounter: Admitting: Internal Medicine
